# Patient Record
Sex: Male | Born: 1940
Health system: Southern US, Community
[De-identification: ages and names within clinical notes are randomized; demographics above are authoritative.]

## PROBLEM LIST (undated history)

## (undated) DIAGNOSIS — I4510 Unspecified right bundle-branch block: Secondary | ICD-10-CM

## (undated) DIAGNOSIS — M1990 Unspecified osteoarthritis, unspecified site: Secondary | ICD-10-CM

## (undated) DIAGNOSIS — I4892 Unspecified atrial flutter: Secondary | ICD-10-CM

## (undated) DIAGNOSIS — I351 Nonrheumatic aortic (valve) insufficiency: Secondary | ICD-10-CM

## (undated) DIAGNOSIS — T7840XA Allergy, unspecified, initial encounter: Secondary | ICD-10-CM

## (undated) DIAGNOSIS — N200 Calculus of kidney: Secondary | ICD-10-CM

## (undated) DIAGNOSIS — I10 Essential (primary) hypertension: Secondary | ICD-10-CM

## (undated) DIAGNOSIS — I340 Nonrheumatic mitral (valve) insufficiency: Secondary | ICD-10-CM

## (undated) DIAGNOSIS — F419 Anxiety disorder, unspecified: Secondary | ICD-10-CM

## (undated) DIAGNOSIS — I517 Cardiomegaly: Secondary | ICD-10-CM

## (undated) DIAGNOSIS — I493 Ventricular premature depolarization: Secondary | ICD-10-CM

## (undated) DIAGNOSIS — I5189 Other ill-defined heart diseases: Secondary | ICD-10-CM

## (undated) DIAGNOSIS — R001 Bradycardia, unspecified: Secondary | ICD-10-CM

## (undated) DIAGNOSIS — C4491 Basal cell carcinoma of skin, unspecified: Secondary | ICD-10-CM

## (undated) DIAGNOSIS — E785 Hyperlipidemia, unspecified: Secondary | ICD-10-CM

## (undated) DIAGNOSIS — K649 Unspecified hemorrhoids: Secondary | ICD-10-CM

## (undated) DIAGNOSIS — IMO0002 Reserved for concepts with insufficient information to code with codable children: Secondary | ICD-10-CM

## (undated) DIAGNOSIS — R9430 Abnormal result of cardiovascular function study, unspecified: Secondary | ICD-10-CM

## (undated) HISTORY — DX: Abnormal result of cardiovascular function study, unspecified: R94.30

## (undated) HISTORY — DX: Hyperlipidemia, unspecified: E78.5

## (undated) HISTORY — DX: Allergy, unspecified, initial encounter: T78.40XA

## (undated) HISTORY — DX: Unspecified right bundle-branch block: I45.10

## (undated) HISTORY — DX: Ventricular premature depolarization: I49.3

## (undated) HISTORY — DX: Unspecified atrial flutter: I48.92

## (undated) HISTORY — DX: Anxiety disorder, unspecified: F41.9

## (undated) HISTORY — DX: Other ill-defined heart diseases: I51.89

## (undated) HISTORY — DX: Basal cell carcinoma of skin, unspecified: C44.91

## (undated) HISTORY — DX: Unspecified osteoarthritis, unspecified site: M19.90

## (undated) HISTORY — DX: Cardiomegaly: I51.7

## (undated) HISTORY — DX: Calculus of kidney: N20.0

## (undated) HISTORY — PX: COLONOSCOPY: SHX174

## (undated) HISTORY — PX: KIDNEY STONE SURGERY: SHX686

## (undated) HISTORY — DX: Nonrheumatic mitral (valve) insufficiency: I34.0

## (undated) HISTORY — DX: Unspecified hemorrhoids: K64.9

## (undated) HISTORY — DX: Nonrheumatic aortic (valve) insufficiency: I35.1

## (undated) HISTORY — DX: Essential (primary) hypertension: I10

## (undated) HISTORY — DX: Reserved for concepts with insufficient information to code with codable children: IMO0002

## (undated) HISTORY — PX: POLYPECTOMY: SHX149

## (undated) HISTORY — DX: Bradycardia, unspecified: R00.1

---

## 2001-03-28 ENCOUNTER — Encounter: Payer: Self-pay | Admitting: Pulmonary Disease

## 2004-08-14 ENCOUNTER — Ambulatory Visit: Payer: Self-pay | Admitting: Pulmonary Disease

## 2005-08-24 ENCOUNTER — Ambulatory Visit: Payer: Self-pay | Admitting: Pulmonary Disease

## 2006-02-28 ENCOUNTER — Ambulatory Visit: Payer: Self-pay | Admitting: Pulmonary Disease

## 2006-03-05 ENCOUNTER — Ambulatory Visit: Payer: Self-pay | Admitting: Pulmonary Disease

## 2006-09-12 ENCOUNTER — Ambulatory Visit: Payer: Self-pay | Admitting: Pulmonary Disease

## 2006-09-12 LAB — CONVERTED CEMR LAB
Albumin: 4.2 g/dL (ref 3.5–5.2)
Basophils Relative: 1.1 % — ABNORMAL HIGH (ref 0.0–1.0)
CO2: 31 meq/L (ref 19–32)
Calcium: 9.6 mg/dL (ref 8.4–10.5)
Cholesterol: 106 mg/dL (ref 0–200)
Eosinophil percent: 2.3 % (ref 0.0–5.0)
GFR calc non Af Amer: 79 mL/min
Glucose, Bld: 105 mg/dL — ABNORMAL HIGH (ref 70–99)
HCT: 48.2 % (ref 39.0–52.0)
Leukocytes, UA: NEGATIVE
MCHC: 33.6 g/dL (ref 30.0–36.0)
Monocytes Relative: 9.2 % (ref 3.0–11.0)
Neutrophils Relative %: 51.1 % (ref 43.0–77.0)
Nitrite: NEGATIVE
Platelets: 191 10*3/uL (ref 150–400)
Potassium: 3.5 meq/L (ref 3.5–5.1)
RDW: 12.4 % (ref 11.5–14.6)
Specific Gravity, Urine: 1.015 (ref 1.000–1.03)
Total Protein, Urine: NEGATIVE mg/dL
Total Protein: 7 g/dL (ref 6.0–8.3)
Urine Glucose: NEGATIVE mg/dL
Urobilinogen, UA: 0.2 (ref 0.0–1.0)
VLDL: 12 mg/dL (ref 0–40)
WBC: 5.7 10*3/uL (ref 4.5–10.5)

## 2007-03-28 ENCOUNTER — Ambulatory Visit: Payer: Self-pay | Admitting: Pulmonary Disease

## 2007-10-20 DIAGNOSIS — Z87442 Personal history of urinary calculi: Secondary | ICD-10-CM

## 2007-10-21 ENCOUNTER — Ambulatory Visit: Payer: Self-pay | Admitting: Pulmonary Disease

## 2007-10-21 DIAGNOSIS — K649 Unspecified hemorrhoids: Secondary | ICD-10-CM | POA: Insufficient documentation

## 2007-10-21 DIAGNOSIS — F411 Generalized anxiety disorder: Secondary | ICD-10-CM

## 2007-10-21 DIAGNOSIS — M1990 Unspecified osteoarthritis, unspecified site: Secondary | ICD-10-CM

## 2007-10-21 HISTORY — DX: Unspecified hemorrhoids: K64.9

## 2007-10-29 ENCOUNTER — Encounter: Payer: Self-pay | Admitting: Pulmonary Disease

## 2007-10-29 ENCOUNTER — Ambulatory Visit: Payer: Self-pay

## 2007-11-04 ENCOUNTER — Telehealth (INDEPENDENT_AMBULATORY_CARE_PROVIDER_SITE_OTHER): Payer: Self-pay | Admitting: *Deleted

## 2007-11-24 ENCOUNTER — Telehealth: Payer: Self-pay | Admitting: Pulmonary Disease

## 2008-04-19 ENCOUNTER — Ambulatory Visit: Payer: Self-pay | Admitting: Pulmonary Disease

## 2008-08-13 ENCOUNTER — Telehealth (INDEPENDENT_AMBULATORY_CARE_PROVIDER_SITE_OTHER): Payer: Self-pay | Admitting: *Deleted

## 2008-10-20 ENCOUNTER — Ambulatory Visit: Payer: Self-pay | Admitting: Pulmonary Disease

## 2008-10-26 DIAGNOSIS — Z85828 Personal history of other malignant neoplasm of skin: Secondary | ICD-10-CM | POA: Insufficient documentation

## 2008-10-27 ENCOUNTER — Ambulatory Visit: Payer: Self-pay | Admitting: Cardiology

## 2008-11-10 ENCOUNTER — Telehealth (INDEPENDENT_AMBULATORY_CARE_PROVIDER_SITE_OTHER): Payer: Self-pay | Admitting: *Deleted

## 2008-11-11 ENCOUNTER — Ambulatory Visit: Payer: Self-pay

## 2008-11-11 ENCOUNTER — Encounter: Payer: Self-pay | Admitting: Cardiology

## 2008-12-01 ENCOUNTER — Ambulatory Visit: Payer: Self-pay | Admitting: Cardiology

## 2008-12-01 ENCOUNTER — Encounter: Payer: Self-pay | Admitting: Cardiology

## 2008-12-28 ENCOUNTER — Ambulatory Visit: Payer: Self-pay

## 2008-12-28 ENCOUNTER — Encounter: Payer: Self-pay | Admitting: Cardiology

## 2009-01-10 ENCOUNTER — Telehealth: Payer: Self-pay | Admitting: Cardiology

## 2009-02-04 ENCOUNTER — Ambulatory Visit: Payer: Self-pay | Admitting: Cardiology

## 2009-05-04 ENCOUNTER — Telehealth (INDEPENDENT_AMBULATORY_CARE_PROVIDER_SITE_OTHER): Payer: Self-pay | Admitting: *Deleted

## 2009-07-06 ENCOUNTER — Encounter (INDEPENDENT_AMBULATORY_CARE_PROVIDER_SITE_OTHER): Payer: Self-pay | Admitting: *Deleted

## 2009-07-18 ENCOUNTER — Telehealth (INDEPENDENT_AMBULATORY_CARE_PROVIDER_SITE_OTHER): Payer: Self-pay | Admitting: *Deleted

## 2009-07-21 ENCOUNTER — Encounter (INDEPENDENT_AMBULATORY_CARE_PROVIDER_SITE_OTHER): Payer: Self-pay | Admitting: *Deleted

## 2009-07-22 ENCOUNTER — Ambulatory Visit: Payer: Self-pay | Admitting: Gastroenterology

## 2009-08-03 DIAGNOSIS — D126 Benign neoplasm of colon, unspecified: Secondary | ICD-10-CM

## 2009-08-03 HISTORY — DX: Benign neoplasm of colon, unspecified: D12.6

## 2009-08-12 ENCOUNTER — Ambulatory Visit: Payer: Self-pay | Admitting: Gastroenterology

## 2009-08-17 ENCOUNTER — Ambulatory Visit: Payer: Self-pay | Admitting: Pulmonary Disease

## 2009-08-23 ENCOUNTER — Encounter: Payer: Self-pay | Admitting: Gastroenterology

## 2009-12-12 ENCOUNTER — Encounter (INDEPENDENT_AMBULATORY_CARE_PROVIDER_SITE_OTHER): Payer: Self-pay | Admitting: *Deleted

## 2009-12-28 ENCOUNTER — Ambulatory Visit: Payer: Self-pay | Admitting: Pulmonary Disease

## 2009-12-28 DIAGNOSIS — J309 Allergic rhinitis, unspecified: Secondary | ICD-10-CM

## 2009-12-28 LAB — CONVERTED CEMR LAB
Alkaline Phosphatase: 66 units/L (ref 39–117)
BUN: 15 mg/dL (ref 6–23)
Bilirubin, Direct: 0.3 mg/dL (ref 0.0–0.3)
CO2: 34 meq/L — ABNORMAL HIGH (ref 19–32)
Chloride: 102 meq/L (ref 96–112)
Cholesterol: 105 mg/dL (ref 0–200)
Creatinine, Ser: 1 mg/dL (ref 0.4–1.5)
GFR calc non Af Amer: 78.62 mL/min (ref >60)
Glucose, Bld: 104 mg/dL — ABNORMAL HIGH (ref 70–99)
HCT: 47.1 % (ref 39.0–52.0)
LDL Cholesterol: 43 mg/dL (ref 0–99)
Lymphocytes Relative: 28.6 % (ref 12.0–46.0)
Lymphs Abs: 1.7 10*3/uL (ref 0.7–4.0)
MCHC: 34 g/dL (ref 30.0–36.0)
MCV: 89.5 fL (ref 78.0–100.0)
Neutro Abs: 3.8 10*3/uL (ref 1.4–7.7)
Neutrophils Relative %: 63.3 % (ref 43.0–77.0)
Potassium: 4.2 meq/L (ref 3.5–5.1)
RDW: 13.9 % (ref 11.5–14.6)
Sodium: 144 meq/L (ref 135–145)
TSH: 0.72 microintl units/mL (ref 0.35–5.50)
Total Bilirubin: 1.8 mg/dL — ABNORMAL HIGH (ref 0.3–1.2)
Total CHOL/HDL Ratio: 2
Total Protein: 7.4 g/dL (ref 6.0–8.3)
VLDL: 5.4 mg/dL (ref 0.0–40.0)

## 2010-02-02 ENCOUNTER — Encounter: Payer: Self-pay | Admitting: Cardiology

## 2010-02-03 ENCOUNTER — Ambulatory Visit: Payer: Self-pay | Admitting: Cardiology

## 2010-02-03 DIAGNOSIS — I4892 Unspecified atrial flutter: Secondary | ICD-10-CM

## 2010-02-03 DIAGNOSIS — I480 Paroxysmal atrial fibrillation: Secondary | ICD-10-CM

## 2010-02-03 HISTORY — DX: Unspecified atrial flutter: I48.92

## 2010-02-06 ENCOUNTER — Telehealth: Payer: Self-pay | Admitting: Pulmonary Disease

## 2010-02-08 ENCOUNTER — Encounter: Payer: Self-pay | Admitting: Cardiology

## 2010-02-08 ENCOUNTER — Ambulatory Visit (HOSPITAL_COMMUNITY): Admission: RE | Admit: 2010-02-08 | Discharge: 2010-02-08 | Payer: Self-pay | Admitting: Cardiology

## 2010-02-08 ENCOUNTER — Ambulatory Visit: Payer: Self-pay

## 2010-02-08 ENCOUNTER — Ambulatory Visit: Payer: Self-pay | Admitting: Cardiovascular Disease

## 2010-02-15 ENCOUNTER — Encounter: Payer: Self-pay | Admitting: Cardiology

## 2010-02-16 ENCOUNTER — Ambulatory Visit: Payer: Self-pay | Admitting: Cardiology

## 2010-03-20 ENCOUNTER — Ambulatory Visit: Payer: Self-pay | Admitting: Internal Medicine

## 2010-03-23 ENCOUNTER — Ambulatory Visit: Payer: Self-pay | Admitting: Cardiology

## 2010-06-21 ENCOUNTER — Encounter: Payer: Self-pay | Admitting: Cardiology

## 2010-06-22 ENCOUNTER — Ambulatory Visit: Payer: Self-pay | Admitting: Cardiology

## 2010-06-28 ENCOUNTER — Ambulatory Visit: Payer: Self-pay | Admitting: Pulmonary Disease

## 2010-10-01 LAB — CONVERTED CEMR LAB
ALT: 24 units/L (ref 0–53)
ALT: 26 units/L (ref 0–53)
Albumin: 4.3 g/dL (ref 3.5–5.2)
Alkaline Phosphatase: 59 units/L (ref 39–117)
Alkaline Phosphatase: 61 units/L (ref 39–117)
Basophils Absolute: 0 10*3/uL (ref 0.0–0.1)
Basophils Absolute: 0.1 10*3/uL (ref 0.0–0.1)
Basophils Relative: 0.6 % (ref 0.0–3.0)
Basophils Relative: 1 % (ref 0.0–1.0)
Bilirubin Urine: NEGATIVE
CO2: 34 meq/L — ABNORMAL HIGH (ref 19–32)
Calcium: 9.7 mg/dL (ref 8.4–10.5)
Calcium: 9.8 mg/dL (ref 8.4–10.5)
Chloride: 101 meq/L (ref 96–112)
Chloride: 102 meq/L (ref 96–112)
Creatinine, Ser: 0.9 mg/dL (ref 0.4–1.5)
Creatinine, Ser: 1 mg/dL (ref 0.4–1.5)
Eosinophils Absolute: 0.1 10*3/uL (ref 0.0–0.7)
Eosinophils Relative: 1.5 % (ref 0.0–5.0)
GFR calc non Af Amer: 79 mL/min
Glucose, Bld: 100 mg/dL — ABNORMAL HIGH (ref 70–99)
Glucose, Bld: 102 mg/dL — ABNORMAL HIGH (ref 70–99)
HCT: 50.3 % (ref 39.0–52.0)
LDL Cholesterol: 53 mg/dL (ref 0–99)
LDL Cholesterol: 61 mg/dL (ref 0–99)
Leukocytes, UA: NEGATIVE
MCHC: 35 g/dL (ref 30.0–36.0)
MCV: 89.7 fL (ref 78.0–100.0)
Monocytes Absolute: 0.4 10*3/uL (ref 0.2–0.7)
Monocytes Relative: 8.4 % (ref 3.0–12.0)
Nitrite: NEGATIVE
PSA: 1.27 ng/mL (ref 0.10–4.00)
Platelets: 140 10*3/uL — ABNORMAL LOW (ref 150–400)
Platelets: 169 10*3/uL (ref 150–400)
Potassium: 4.3 meq/L (ref 3.5–5.1)
RBC: 5.37 M/uL (ref 4.22–5.81)
RBC: 5.6 M/uL (ref 4.22–5.81)
RDW: 12.5 % (ref 11.5–14.6)
Sodium: 142 meq/L (ref 135–145)
TSH: 1.48 microintl units/mL (ref 0.35–5.50)
Total CHOL/HDL Ratio: 2.1
Total Protein, Urine: NEGATIVE mg/dL
Total Protein: 7 g/dL (ref 6.0–8.3)
Triglycerides: 47 mg/dL (ref 0–149)
Urine Glucose: NEGATIVE mg/dL
WBC: 5.5 10*3/uL (ref 4.5–10.5)
WBC: 5.7 10*3/uL (ref 4.5–10.5)
pH: 7 (ref 5.0–8.0)

## 2010-10-04 NOTE — Assessment & Plan Note (Signed)
Summary: ROV 6 MONTHS///KP   Primary Care Carlos [name]:  Carlos [name]  CC:  6 month ROV & review mult medical problems....  History of Present Illness: 70 y/o WM here for a follow up visit... he has multiple medical problems as noted below...     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  BP controlled on meds;  denies CP, palpit, SOB, edema;  Chol looks good on Simva + Fish Oil;  no new complaints or concerns...    ~  June 28, 2010:  he has developed AFlutter in the interval & had evals from [name] 7/11 & [name] 10/11- 2DEcho 6/11 showed mod conc LVH, norm wall motion & EF= 50-55%, mild AI, mild thick MV leaflets w/ mild MR, mod LAdil= 42mm;  Holter 6/11 revealed AFlutter (ave rate 106, max=145, no pauses)... they rec rate control & ASA, w/ consideration of cath ablation if not controlled...  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    Current Problems:   HYPERTENSION (ICD-401.9) - controlled on ATENOLOL 50mg /d,  FELODIPINE 10mg /d, & LOSARTAN/HCT 100-12.5 daily...  BP= 110/66 today & similar at home (ave= 130-140/ 80)- tolerating rx well... pt denies HA, fatigue, visual changes, CP, palipit, dizziness, syncope, dyspnea, edema, etc...  ~  see 2DEchos below>  ~  CXR 4/11 showed borderline cardiomeg, clear lungs, DJD spine, NAD.Marland KitchenMarland Kitchen  VALVULAR HEART DISEASE (ICD-424.90) - followed by Delton See- he knows to take AMOX prior to dental work...   ~  2DEcho in 2002 w/ mild AI, MR, TR and normal LVF...  ~  2DEcho 2/09 showed LV sl dilated w/ mild DD, norm LVF w/o regional wall motion abn & EF= 55-60%;  mild AI, mild-mod MR, dil LA @ 52mm...  ~  NuclearStressTest 3/10 showed mild inferobas thinning, no ischemia, EF=48% w/ mild global HK...  ~  2DEcho 4/10 showed mild AI & MR, mod dil LA,  norm LVF w/ EF= 55-60% (Myoview= false result).  ~  2DEcho 6/11 showed mod conc LVH, norm wall motion & EF= 50-55%, mild AI, mild thick MV leaflets w/ mild MR, mod LAdil= 42mm.  ATRIAL FLUTTER (ICD-427.32) & BUNDLE BRANCH BLOCK, RIGHT (ICD-426.4) - on ASA 325mg /d and rate control strategy... prev EKG's w/ IRBBB...2/09 w/ IVCD, NSR, & QS wave in III and aVF (new)... check LVF (nl) and wall motion (nl) on echo...  ~  he developed AFlutter 2011& had evals from [name] 7/11 & [name] 10/11- 2DEcho 6/11 showed mod conc LVH, norm wall motion & EF= 50-55%, mild AI, mild thick MV leaflets w/ mild MR, mod LAdil= 42mm;  Holter 6/11 revealed AFlutter (ave rate 106, max=145, no pauses)... they rec rate control & ASA, w/ consideration of cath ablation if not controlled...  HYPERCHOLESTEROLEMIA (ICD-272.0) - on SIMVASTATIN 40mg /d, Fish Oil, & diet...   ~  FLP 1/08 showed TChol 106, TG 59, HDL 48, LDL46  ~  FLP 2/09 showed TChol 123, TG 57, HDL 51, LDL 61  ~  FLP 2/10 showed TChol 117, TG 47, HDL 55, LDL 53  ~  FLP 4/11 showed TChol 105, TG 27, HDL 57, LDL 43  Hx of HEMORRHOIDS (ICD-455.6) - last colonoscopy in [place] 3/05 by DrNichols was neg x hems... rec f/u 5 yrs due  to fam history & he asks to be seen here (refer to GI)...  ~  12/10: had f/u colonoscopy by DrStark- 3mm sessile polyp in cecum= path pending...  +mild divertics...   RENAL CALCULUS, HX OF (ICD-V13.01) - surg in 1974 for kid stone... no recurrence...  DEGENERATIVE JOINT DISEASE (ICD-715.90) - occas neck discomfort without arm pain, numbness, etc... he is very active...  ANXIETY (ICD-300.00)  BASAL CELL CARCINOMA, HX OF (ICD-V10.83) - he follow up w/ Derm in [place] every 3-80months.  Health Maintenance:  ~  GI:  DrStark w/ colon 12/10 as above...  ~  GU:  4/11> DRE= neg & PSA= 1.50  ~  Immunizations:  he gets yearly Flu shots,  ?Pneumovax,  ?Tetanus...   Preventive Screening-Counseling & Management  Alcohol-Tobacco     Smoking  Status: never  Allergies (verified): No Known Drug Allergies  Comments:  Nurse/Medical Assistant: The patient's medications and allergies were reviewed with the patient and were updated in the Medication and Allergy Lists.  Past History:  Past Medical History: ALLERGIC RHINITIS (ICD-477.9) HYPERTENSION (ICD-401.9)  AORTIC INSUFFICIENCY (ICD-424.1)....mild...echo.Marland KitchenMarland Kitchen6/2011 MITRAL REGURGITATION (ICD-396.3)...mild...echo.Marland KitchenMarland Kitchen6/2011 LVH   moderate...echo.Marland KitchenMarland Kitchen6/2011 DIASTOLIC DYSFUNCTION (ICD-429.9) EF  50-55%...echo.Marland KitchenMarland Kitchen6/2011 (55-60% echo 2009) BUNDLE BRANCH BLOCK, RIGHT (ICD-426.4)=   IRBBB; sinus Bradycadia w/ PVCs Nuclear stress... march, 2010... no ischemia... EF 48% by echo at that time normal LV function ATRIAL FLUTTER (ICD-427.32) Atrial fibrillation.(or ATRIAL FLUTTER).... diagnosed.. February 03, 2010 /  consult by Dr. Ladona Ridgel.. June, 2011... plan rate control.. Coumadin not needed...ablation if rate not controlled well  HYPERCHOLESTEROLEMIA (ICD-272.0) HEMORRHOIDS (ICD-455.6);; RENAL CALCULUS, HX OF (ICD-V13.01) DEGENERATIVE JOINT DISEASE (ICD-715.90) ANXIETY (ICD-300.00) BASAL CELL CARCINOMA, HX OF (ICD-V10.83)  Past Surgical History: S/P surgery for kidney stone 1974  Family History: Reviewed history from 04/19/2008 and no changes required. mother alive age 73  hx of HBP father deceased age 62 1 sibling alive age 16 1 sibling alive age 1  Social History: Reviewed history from 04/19/2008 and no changes required. never smoked exercises daily no caffeine etoh---3 glasses of wine per week married 2 children  Review of Systems      See HPI  The patient denies anorexia, fever, weight loss, weight gain, vision loss, decreased hearing, hoarseness, chest pain, syncope, dyspnea on exertion, peripheral edema, prolonged cough, headaches, hemoptysis, abdominal pain, melena, hematochezia, severe indigestion/heartburn, hematuria, incontinence, muscle weakness, suspicious skin  lesions, transient blindness, difficulty walking, depression, unusual weight change, abnormal bleeding, enlarged lymph nodes, and angioedema.    Vital Signs:  Patient profile:   70 year old male Height:      72 inches Weight:      186.2 pounds BMI:     25.34 O2 Sat:      97 % on Room air Temp:     97.1 degrees F oral Pulse rate:   49 / minute BP sitting:   110 / 66  (left arm) Cuff size:   regular  Vitals Entered By: Randell Loop CMA (June 28, 2010 9:43 AM)  O2 Flow:  Room air CC: 6 month ROV & review mult medical problems...   Physical Exam  Additional Exam:  WD, WN, 70 y/o WM in NAD... GENERAL:  Alert & oriented; pleasant & cooperative... HEENT:  [place]/AT, EOM-wnl, PERRLA, Fundi-benign, EACs-clear, TMs-wnl, NOSE-clear, THROAT-clear & wnl. NECK:  Supple w/ fairROM; no JVD; normal carotid impulses w/o bruits; no thyromegaly or nodules palpated; no lymphadenopathy. CHEST:  Clear to P & A; without wheezes/ rales/ or rhonchi. HEART:  Regular Rhythm;  gr1/6 SEM LSB  without rubs or gallops heard... ABDOMEN:  Soft & nontender; normal bowel sounds; no organomegaly or masses detected. (RECTAL:  Neg - prostate 2+ & nontender w/o nodules; stool hematest neg) EXT: without deformities or arthritic changes; no varicose veins/ venous insuffic/ or edema. NEURO:  CN's intact; motor testing normal; sensory testing normal; gait normal & balance OK. DERM:  No lesions noted; no rash etc...    Impression & Recommendations:  Problem # 1:  HYPERTENSION (ICD-401.9) Controlled>  same meds. His updated medication list for this problem includes:    Atenolol 50 Mg Tabs (Atenolol) .Marland Kitchen... Take 1 tablet by mouth once daily    Felodipine 10 Mg Tb24 (Felodipine) .Marland Kitchen... Take 1 tablet by mouth once a day    Hyzaar 100-12.5 Mg Tabs (Losartan potassium-hctz) .Marland Kitchen... Take 1 tablet by mouth once a day  Problem # 2:  ATRIAL FLUTTER (ICD-427.32) Stable rate control strategy>  per [name]. His updated medication  list for this problem includes:    Bayer Low Strength 81 Mg Tbec (Aspirin) .Marland Kitchen... Take 1 tablet by mouth once a day    Atenolol 50 Mg Tabs (Atenolol) .Marland Kitchen... Take 1 tablet by mouth once daily    Felodipine 10 Mg Tb24 (Felodipine) .Marland Kitchen... Take 1 tablet by mouth once a day  Problem # 3:  MITRAL REGURGITATION (ICD-396.3) Forllowed for Cards by [name]>  stable, continue current Rx. His updated medication list for this problem includes:    Bayer Low Strength 81 Mg Tbec (Aspirin) .Marland Kitchen... Take 1 tablet by mouth once a day    Atenolol 50 Mg Tabs (Atenolol) .Marland Kitchen... Take 1 tablet by mouth once daily  Problem # 4:  HYPERCHOLESTEROLEMIA (ICD-272.0) Well controlled on diet + Simva40... continue same. His updated medication list for this problem includes:    Simvastatin 40 Mg Tabs (Simvastatin) .Marland Kitchen... Take 1 tab by mouth at bedtime...  Problem # 5:  Hx of HEMORRHOIDS (ICD-455.6) GI & GU are stable & up to date...  Problem # 6:  OTHER MEDICAL PROBLEMS AS NOTED>>> OK 2011 Flu vaccine today...  Complete Medication List: 1)  Bayer Low Strength 81 Mg Tbec (Aspirin) .... Take 1 tablet by mouth once a day 2)  Atenolol 50 Mg Tabs (Atenolol) .... Take 1 tablet by mouth once daily 3)  Felodipine 10 Mg Tb24 (Felodipine) .... Take 1 tablet by mouth once a day 4)  Hyzaar 100-12.5 Mg Tabs (Losartan potassium-hctz) .... Take 1 tablet by mouth once a day 5)  Simvastatin 40 Mg Tabs (Simvastatin) .... Take 1 tab by mouth at bedtime.Marland KitchenMarland Kitchen 6)  Fish Oil 1000 Mg Caps (Omega-3 fatty acids) .... Take one capsule daily.Marland KitchenMarland Kitchen 7)  Multivitamins Tabs (Multiple vitamin) .... Take 1 tablet by mouth once a day 8)  Amoxicillin 500 Mg Caps (Amoxicillin) .... 4 tabs 1 hr prior to procedure  Other Orders: Flu Vaccine 27yrs + MEDICARE PATIENTS (F0932) Administration Flu vaccine - MCR (T5573)  Patient Instructions: 1)  Today we updated your med list- see below.... 2)  Continue your current meds the same... 3)  Today we gave you the 2011 Flu  vaccine... 4)  Call for any problems.Marland KitchenMarland Kitchen 5)  Please schedule a follow-up appointment in 6 months.                 Flu Vaccine Consent Questions     Do you have a history of severe allergic reactions to this vaccine? no    Any prior history of allergic reactions to egg and/or gelatin? no  Do you have a sensitivity to the preservative Thimersol? no    Do you have a past history of Guillan-Barre Syndrome? no    Do you currently have an acute febrile illness? no    Have you ever had a severe reaction to latex? no    Vaccine information given and explained to patient? yes    Are you currently pregnant? no    Lot Number:[id number]   Exp Date:03/03/2011   Site Given  Left Deltoid IMflu1 Zackery Barefoot CMA  June 28, 2010 11:19 AM

## 2010-10-04 NOTE — Assessment & Plan Note (Signed)
Summary: 1 month rov.sl   Visit Type:  Follow-up Referring Provider:  Sharrell Ku, MD Primary Provider:  Chauncey Mann  CC:  atrial flutter.  History of Present Illness: The patient is seen for followup of atrial flutter.  I had seen him last on February 16, 2010. At that time I noted that his monitor showed atrial flutter.  I consider a one-time cardioversion.  The patient and his wife and I ultimately decided to refer him to Dr. Ladona Ridgel for an opinion.  Dr. Ladona Ridgel felt that most likely he would not hold sinus rhythm.  His embolic risk is low.  He felt that rate control would be an excellent approach.  Patient has been on atenolol in the past and historically it is effective in slowing his heart rate.  His atenolol dose was therefore increased and the patient is here for followup.  Dr. Ladona Ridgel noted that the patient's heart rate was in the range of 100 on the monitor.  Patient feels well today.  Current Medications (verified): 1)  Bayer Aspirin 81 Mg  Tabs (Aspirin) .... Take 1 Tablet By Mouth Once A Day 2)  Atenolol 50 Mg  Tabs (Atenolol) .... Take 1 Tablet By Mouth Once Daily 3)  Felodipine 10 Mg  Tb24 (Felodipine) .... Take 1 Tablet By Mouth Once A Day 4)  Hyzaar 100-12.5 Mg Tabs (Losartan Potassium-Hctz) .... Take 1 Tablet By Mouth Once A Day 5)  Simvastatin 40 Mg  Tabs (Simvastatin) .... Take 1 Tab By Mouth At Bedtime.Marland KitchenMarland Kitchen 6)  Fish Oil 1000 Mg  Caps (Omega-3 Fatty Acids) .... Take One Capsule Daily.Marland KitchenMarland Kitchen 7)  Multivitamins   Tabs (Multiple Vitamin) .... Take 1 Tablet By Mouth Once A Day 8)  Amoxicillin 500 Mg Caps (Amoxicillin) .... 4 Tabs 1 Hr Prior To Procedure  Allergies (verified): No Known Drug Allergies  Past History:  Past Medical History: ALLERGIC RHINITIS (ICD-477.9) HYPERTENSION (ICD-401.9) AORTIC INSUFFICIENCY (ICD-424.1)....mild...echo.Marland KitchenMarland Kitchen6/2011 MITRAL REGURGITATION (ICD-396.3)...mild...echo.Marland KitchenMarland Kitchen6/2011 LVH   moderate...echo.Marland KitchenMarland Kitchen6/2011 EF  50-55%...echo.Marland KitchenMarland Kitchen6/2011 (55-60% echo  2009) DIASTOLIC DYSFUNCTION (ICD-429.9) BUNDLE BRANCH BLOCK, RIGHT (ICD-426.4)=   IRBBB; sinus Bradycadia w/ PVCs Nuclear stress... march, 2010... no ischemia... EF 48% by echo at that time normal LV function HYPERCHOLESTEROLEMIA (ICD-272.0) HEMORRHOIDS (ICD-455.6) RENAL CALCULUS, HX OF (ICD-V13.01) DEGENERATIVE JOINT DISEASE (ICD-715.90) ANXIETY (ICD-300.00) BASAL CELL CARCINOMA, HX OF (ICD-V10.83) Atrial fibrillation.(or ATRIAL FLUTTER).... diagnosed.. February 03, 2010 /  consult by Dr. Ladona Ridgel.. June, 2011... plan rate control.. Coumadin not needed  Review of Systems       Patient denies fever, chills, headache, sweats, rash, change in vision, change in hearing, chest pain, cough, nausea vomiting, urinary symptoms.  All other systems are reviewed and are negative.  Vital Signs:  Patient profile:   70 year old male Height:      72 inches Weight:      186 pounds BMI:     25.32 Pulse rate:   85 / minute BP sitting:   108 / 80  (left arm) Cuff size:   regular  Vitals Entered By: Hardin Negus, RMA (March 23, 2010 3:18 PM)  Physical Exam  General:  patient is stable. Eyes:  no xanthelasma. Neck:  no jugular venous distention. Lungs:  lungs are clear.  Respiratory effort is nonlabored. Heart:  cardiac exam reveals S1-S2.  No clicks or significant murmurs. Abdomen:  abdomen is soft. Extremities:  no peripheral edema. Psych:  patient is oriented to person time and place.  Affect is normal.  He is here with his wife today.  Impression & Recommendations:  Problem # 1:  HYPERTENSION (ICD-401.9) Blood pressure is under reasonable control at this time.  No change in therapy.  Problem # 2:  ATRIAL FLUTTER (ICD-427.32) I have reviewed completely the consult note from Dr. Ladona Ridgel.  The initial strategy will be rate controlled with aspirin.  If this does not work  ablation will be considered.the patient's heart rate will be watched with him on a higher dose of atenolol.  He will call these  in to [place].  Patient Instructions: 1)  Give [place] a call with some readings of his Heart Rate, [phone] Heather S.  2)  Follow up in 3 months.

## 2010-10-04 NOTE — Assessment & Plan Note (Signed)
Summary: ep eval  dx v12.59/per dr Myrtis Ser   Visit Type:  Follow-up Primary Provider:  Chauncey Mann   History of Present Illness: Carlos [name] is referred today for evaluation of atrial flutter by Dr.Katz.  The patient is a very pleasant 70 yo man with a h/o atrial flutter diagnosed last month.  He has not had any symptoms that he will admit to.  He carries a h/o mitral regurge and aortic insufficiency in the setting of LVH.  He remains very active, despite being retired keeping a full time job in [employer].  His wife who is with him corroborates his history.  He had a 24 hour holter which demonstrated atrial flutter with an average ventricular rate of 106 and a maximum rate of 145.  He had no pauses.  He has a h/o sinus bradycardia requiring him to decrease his dose of atenolol in the past (when he was in NSR).  He has never had syncope or dizziness.  He denies c/p and sob.  Current Medications (verified): 1)  Bayer Aspirin 81 Mg  Tabs (Aspirin) .... Take 1 Tablet By Mouth Once A Day 2)  Atenolol 50 Mg  Tabs (Atenolol) .... Take 1/2 Tablet By Mouth Once Daily 3)  Felodipine 10 Mg  Tb24 (Felodipine) .... Take 1 Tablet By Mouth Once A Day 4)  Hyzaar 100-12.5 Mg Tabs (Losartan Potassium-Hctz) .... Take 1 Tablet By Mouth Once A Day 5)  Simvastatin 40 Mg  Tabs (Simvastatin) .... Take 1 Tab By Mouth At Bedtime.Marland KitchenMarland Kitchen 6)  Fish Oil 1000 Mg  Caps (Omega-3 Fatty Acids) .... Take One Capsule Daily.Marland KitchenMarland Kitchen 7)  Multivitamins   Tabs (Multiple Vitamin) .... Take 1 Tablet By Mouth Once A Day 8)  Amoxicillin 500 Mg Caps (Amoxicillin) .... 4 Tabs 1 Hr Prior To Procedure  Allergies (verified): No Known Drug Allergies  Past History:  Past Medical History: Last updated: 02/16/2010 ALLERGIC RHINITIS (ICD-477.9) HYPERTENSION (ICD-401.9) AORTIC INSUFFICIENCY (ICD-424.1)....mild...echo.Marland KitchenMarland Kitchen6/2011 MITRAL REGURGITATION (ICD-396.3)...mild...echo.Marland KitchenMarland Kitchen6/2011 LVH   moderate...echo.Marland KitchenMarland Kitchen6/2011 EF  50-55%...echo.Marland KitchenMarland Kitchen6/2011  (55-60% echo 2009) DIASTOLIC DYSFUNCTION (ICD-429.9) BUNDLE BRANCH BLOCK, RIGHT (ICD-426.4)=   IRBBB; sinus Bradycadia w/ PVCs Nuclear stress... march, 2010... no ischemia... EF 48% by echo at that time normal LV function HYPERCHOLESTEROLEMIA (ICD-272.0) HEMORRHOIDS (ICD-455.6) RENAL CALCULUS, HX OF (ICD-V13.01) DEGENERATIVE JOINT DISEASE (ICD-715.90) ANXIETY (ICD-300.00) BASAL CELL CARCINOMA, HX OF (ICD-V10.83) Atrial fibrillation.(or ATRIAL FLUTTER).... diagnosed.. February 03, 2010  Past Surgical History: Last updated: 12/28/2009 S/P surgery for kidney stone 1974  Family History: Last updated: 04/19/2008 mother alive age 51  hx of HBP father deceased age 71 1 sibling alive age 51 1 sibling alive age 65  Social History: Last updated: 04/19/2008 never smoked exercises daily no caffeine etoh---3 glasses of wine per week married 2 children  Review of Systems       All systems reviewed and negative.  Vital Signs:  Patient profile:   70 year old male Height:      72 inches Weight:      187 pounds BMI:     25.45 O2 Sat:      94 % Pulse rate:   74 / minute BP sitting:   152 / 80  (left arm)  Vitals Entered By: Laurance Flatten CMA (March 20, 2010 4:27 PM)  Physical Exam  General:  Well developed, well nourished, in no acute distress.  HEENT: normal Neck: supple. No JVD. Carotids 2+ bilaterally no bruits Cor: RRR with no rubs or gallops. A 2/6 systolic murmur is present at the  LLSB. Lungs: CTA. No increased work of breathing. Ab: soft, nontender. nondistended. No HSM. Good bowel sounds Ext: warm. no cyanosis, clubbing or edema Neuro: alert and oriented. Grossly nonfocal. affect pleasant    Impression & Recommendations:  Problem # 1:  ATRIAL FLUTTER (ICD-427.32) His symptoms are well controlled.  His rate is not well controlled.  I discussed the treatment options with the patient at length.  His CHADS score is one. (HTN) and after a long discussion, I think an initial  strategy of rate control and ASA is most reasonable.  If his rate is not well controlled, then I would recommend proceeding with catheter ablation.  I think the likelihood of his developing atrial fibrillation after ablation with his valvular abnormalities is remote. His updated medication list for this problem includes:    Atenolol 50 Mg Tabs (Atenolol) .Marland Kitchen... Take 1 tablet by mouth once daily  Problem # 2:  HYPERTENSION (ICD-401.9) His blood pressure is elevated in the office today. Unclear if this represents "white coat" HTN or not.  I have asked him to increase his atenolol to 50 mg daily. A low sodium diet is also recommended. His updated medication list for this problem includes:    Atenolol 50 Mg Tabs (Atenolol) .Marland Kitchen... Take 1 tablet by mouth once daily    Felodipine 10 Mg Tb24 (Felodipine) .Marland Kitchen... Take 1 tablet by mouth once a day    Hyzaar 100-12.5 Mg Tabs (Losartan potassium-hctz) .Marland Kitchen... Take 1 tablet by mouth once a day  Problem # 3:  DIASTOLIC DYSFUNCTION (ICD-429.9) He has no evidence of CHF.  I have asked the patient and his wife to watch out for any subtle signs of CHF.    Patient Instructions: 1)  Your physician recommends that you schedule a follow-up appointment in: as needed 2)  Your physician has recommended you make the following change in your medication: increase Atenolol to 50mg  daily

## 2010-10-04 NOTE — Miscellaneous (Signed)
  Clinical Lists Changes  Observations: Added new observation of PAST MED HX: ALLERGIC RHINITIS (ICD-477.9) HYPERTENSION (ICD-401.9) AORTIC INSUFFICIENCY (ICD-424.1)....mild...echo.Marland KitchenMarland Kitchen6/2011 MITRAL REGURGITATION (ICD-396.3)...mild...echo.Marland KitchenMarland Kitchen6/2011 LVH   moderate...echo.Marland KitchenMarland Kitchen6/2011 EF  50-55%...echo.Marland KitchenMarland Kitchen6/2011 (55-60% echo 2009) DIASTOLIC DYSFUNCTION (ICD-429.9) BUNDLE BRANCH BLOCK, RIGHT (ICD-426.4)=   IRBBB; sinus [place]cadia w/ PVCs Nuclear stress... march, 2010... no ischemia... EF 48% by echo at that time normal LV function HYPERCHOLESTEROLEMIA (ICD-272.0) HEMORRHOIDS (ICD-455.6) RENAL CALCULUS, HX OF (ICD-V13.01) DEGENERATIVE JOINT DISEASE (ICD-715.90) ANXIETY (ICD-300.00) BASAL CELL CARCINOMA, HX OF (ICD-V10.83) Atrial fibrillation.(or ATRIAL FLUTTER).... diagnosed.. February 03, 2010 /  consult by Dr. Ladona Ridgel.. June, 2011... plan rate control.. Coumadin not needed...ablation if rate not controlled well    (06/21/2010 12:39) Added new observation of REFERRING MD: Sharrell Ku, MD (06/21/2010 12:39) Added new observation of PRIMARY MD: Chauncey Mann (06/21/2010 12:39)       Past History:  Past Medical History: ALLERGIC RHINITIS (ICD-477.9) HYPERTENSION (ICD-401.9) AORTIC INSUFFICIENCY (ICD-424.1)....mild...echo.Marland KitchenMarland Kitchen6/2011 MITRAL REGURGITATION (ICD-396.3)...mild...echo.Marland KitchenMarland Kitchen6/2011 LVH   moderate...echo.Marland KitchenMarland Kitchen6/2011 EF  50-55%...echo.Marland KitchenMarland Kitchen6/2011 (55-60% echo 2009) DIASTOLIC DYSFUNCTION (ICD-429.9) BUNDLE BRANCH BLOCK, RIGHT (ICD-426.4)=   IRBBB; sinus [place] w/ PVCs Nuclear stress... march, 2010... no ischemia... EF 48% by echo at that time normal LV function HYPERCHOLESTEROLEMIA (ICD-272.0) HEMORRHOIDS (ICD-455.6) RENAL CALCULUS, HX OF (ICD-V13.01) DEGENERATIVE JOINT DISEASE (ICD-715.90) ANXIETY (ICD-300.00) BASAL CELL CARCINOMA, HX OF (ICD-V10.83) Atrial fibrillation.(or ATRIAL FLUTTER).... diagnosed.. February 03, 2010 /  consult by Dr. Ladona Ridgel.. June, 2011... plan rate control.. Coumadin not  needed...ablation if rate not controlled well

## 2010-10-04 NOTE — Miscellaneous (Signed)
  Clinical Lists Changes  Observations: Added new observation of CXR RESULTS: The lungs are clear but hyperaerated .  Mediastinal contours are stable.  The heart is mildly enlarged and stable. There are degenerative changes throughout the thoracic spine.   IMPRESSION: Stable cardiomegaly.  No change in hyperaeration . (12/28/2009 9:56)      CXR  Procedure date:  12/28/2009  Findings:      The lungs are clear but hyperaerated .  Mediastinal contours are stable.  The heart is mildly enlarged and stable. There are degenerative changes throughout the thoracic spine.   IMPRESSION: Stable cardiomegaly.  No change in hyperaeration .

## 2010-10-04 NOTE — Assessment & Plan Note (Signed)
Summary: f1y   Visit Type:  Follow-up Primary Provider:  Chauncey Mann  CC:  aortic insufficiency and mitral regurgitation.  History of Present Illness: The patient is seen for followup of aortic insufficiency and mitral regurgitation.  I saw him last in June, 2011.  He is very active and he is done very well.  He was seen last by Dr.Nadel on December 28, 2009.  There is an excellent complete note from Dr.Nadel.  At that time his physical exam is described as showing his heartbeat to be regular.  Patient has not had any symptoms.  However EKG today reveals atrial fibrillation.  The resting rate is mildly increased.  The patient has no symptoms from this.  There is no prior history of atrial fibrillation.  Current Medications (verified): 1)  Bayer Aspirin 81 Mg  Tabs (Aspirin) .... Take 1 Tablet By Mouth Once A Day 2)  Atenolol 50 Mg  Tabs (Atenolol) .... Take 1/2 Tablet By Mouth Once Daily 3)  Felodipine 10 Mg  Tb24 (Felodipine) .... Take 1 Tablet By Mouth Once A Day 4)  Hyzaar 100-12.5 Mg Tabs (Losartan Potassium-Hctz) .... Take 1 Tablet By Mouth Once A Day 5)  Simvastatin 40 Mg  Tabs (Simvastatin) .... Take 1 Tab By Mouth At Bedtime.Marland KitchenMarland Kitchen 6)  Fish Oil 1000 Mg  Caps (Omega-3 Fatty Acids) .... Take One Capsule Daily.Marland KitchenMarland Kitchen 7)  Multivitamins   Tabs (Multiple Vitamin) .... Take 1 Tablet By Mouth Once A Day 8)  Amoxicillin 500 Mg Caps (Amoxicillin) .... 4 Tabs 1 Hr Prior To Procedure  Allergies (verified): No Known Drug Allergies  Past History:  Past Medical History: ALLERGIC RHINITIS (ICD-477.9) HYPERTENSION (ICD-401.9) VALVULAR HEART DISEASE (ICD-424.90) - MR= mild/mod, AI= mild, mild Diastolic Dysfunction AORTIC INSUFFICIENCY (ICD-424.1) MITRAL REGURGITATION (ICD-396.3) DIASTOLIC DYSFUNCTION (ICD-429.9) BUNDLE BRANCH BLOCK, RIGHT (ICD-426.4)=   IRBBB; sinus [place]cadia w/ PVCs, EF 55-60% on 2D.Marland KitchenFebruary, 2009 Nuclear stress... march, 2010... no ischemia... EF 48% by echo at that time normal LV  function HYPERCHOLESTEROLEMIA (ICD-272.0) Hx of HEMORRHOIDS (ICD-455.6) RENAL CALCULUS, HX OF (ICD-V13.01) DEGENERATIVE JOINT DISEASE (ICD-715.90) ANXIETY (ICD-300.00) BASAL CELL CARCINOMA, HX OF (ICD-V10.83) Atrial fibrillation..... diagnosed.. February 03, 2010  Review of Systems       Patient denies fever, chills, headache, sweats, rash, change in vision, change in hearing, chest pain, cough, nausea vomiting, urinary symptoms.  All other systems are reviewed and are negative.  Vital Signs:  Patient profile:   70 year old male Height:      72 inches Weight:      187 pounds BMI:     25.45 Pulse rate:   74 / minute BP sitting:   136 / 62  (left arm) Cuff size:   regular  Vitals Entered By: Burnett Kanaris, CNA (February 03, 2010 9:39 AM)  Physical Exam  General:  The patient is quite stable. Head:  head is atraumatic. Eyes:  no xanthelasma. Neck:  no jugular venous distention.  No carotid bruits. Chest Wall:  no chest wall tenderness. Lungs:  lungs are clear.  Respiratory effort is nonlabored. Heart:  the rhythm is irregularly irregular.  There are no clicks or significant murmurs. Abdomen:  abdomen is soft. Msk:  no musculoskeletal deformities. Extremities:  no peripheral edema. Skin:  no skin rashes. Psych:  patient is oriented to person time and place.  Affect is normal.   Impression & Recommendations:  Problem # 1:  DYSLIPIDEMIA (ICD-272.4)  His updated medication list for this problem includes:    Simvastatin 40  Mg Tabs (Simvastatin) .Marland Kitchen... Take 1 tab by mouth at bedtime... The patient's lipids are treated.  Problem # 2:  HYPERTENSION (ICD-401.9)  His updated medication list for this problem includes:    Atenolol 50 Mg Tabs (Atenolol) .Marland Kitchen... Take 1/2 tablet by mouth once daily    Felodipine 10 Mg Tb24 (Felodipine) .Marland Kitchen... Take 1 tablet by mouth once a day    Hyzaar 100-12.5 Mg Tabs (Losartan potassium-hctz) .Marland Kitchen... Take 1 tablet by mouth once a day Blood pressure is under  good control.  No change in therapy.  Problem # 3:  AORTIC INSUFFICIENCY (ICD-424.1)  His updated medication list for this problem includes:    Atenolol 50 Mg Tabs (Atenolol) .Marland Kitchen... Take 1/2 tablet by mouth once daily    Hyzaar 100-12.5 Mg Tabs (Losartan potassium-hctz) .Marland Kitchen... Take 1 tablet by mouth once a day Aortic insufficiency by history it is mild.  Followup two-dimensional echo is to be done.  Problem # 4:  MITRAL REGURGITATION (ICD-396.3) Mitral regurgitation has been mild by history.  Followup two-dimensional echo is to be done.  Problem # 5:  ATRIAL FIBRILLATION, HX OF (ICD-V12.59)  His updated medication list for this problem includes:    Atenolol 50 Mg Tabs (Atenolol) .Marland Kitchen... Take 1/2 tablet by mouth once daily    Felodipine 10 Mg Tb24 (Felodipine) .Marland Kitchen... Take 1 tablet by mouth once a day The diagnosis of atrial fibrillation is new today.  The patient does not sense the rhythm.  I cannot be sure how long he has been in atrial fib or whether or not he goes in and out.  I explained this to him completely.  EKG is done today and reviewed by me.  He has incomplete right bundle branch block that is unchanged.  There is atrial fibrillation.  Two-dimensional echo will be done to reassess LV function and valvular function.  He will wear a 24-hour Holter to see if he is in atrial fib all the time. Recent TSH was normal.  His CHADS2  score is one.  I've chosen not to start Coumadin at this time.  I've also chosen not to adjust his medicines yet.  When I see him back with Holter data an echo data I will decide whether we need meds for rate control.  Also decide if we will proceed with anticoagulation and the possibility of cardioversion on one occasion.  The patient does not have symptoms.  Orders: Holter Monitor (Holter Monitor) Echocardiogram (Echo)  Patient Instructions: 1)  Your physician recommends that you schedule a follow-up appointment in: 2 weeks 2)  Your physician has requested that  you have an echocardiogram.  Echocardiography is a painless test that uses sound waves to create images of your heart. It provides your doctor with information about the size and shape of your heart and how well your heart's chambers and valves are working.  This procedure takes approximately one hour. There are no restrictions for this procedure. 3)  Your physician has recommended that you wear a holter monitor.  Holter monitors are medical devices that record the heart's electrical activity. Doctors most often use these monitors to diagnose arrhythmias. Arrhythmias are problems with the speed or rhythm of the heartbeat. The monitor is a small, portable device. You can wear one while you do your normal daily activities. This is usually used to diagnose what is causing palpitations/syncope (passing out).

## 2010-10-04 NOTE — Assessment & Plan Note (Signed)
Summary: 2WKS F/U ECHO/HOLTER MONITOR ON 02-08-10/SL   Visit Type:  Follow-up Primary Provider:  Chauncey Mann  CC:  atrial flutter.  History of Present Illness: The patient is seen in followup his atrial arrhythmia.  He has mild valvular heart disease.  There is history of right bundle branch block.  There is no history of proven coronary disease.  Nuclear stress study in March, 2010 revealed no significant ischemia.  When I saw him for routine visit on February 03, 2010, the rhythm was irregular.  I felt this was atrial fib at that time.  With review of the 12-lead today, he may well have been atrial flutter.  The patient had no symptoms.  I decided to do a followup 2-D echo and to have him wear a 24 hour Holter monitor.  By my review today I believe that the Holter reveals atrial flutter throughout the day.  There are varied rates.  This patient said he was active during the day and had no symptoms.  Two-dimensional echo was done on February 08, 2010.  Ejection fraction was read as 50-55%. (with the patient in atrial flutter).   Echo in 2009 had been read as 55-60%.his current echo reveals that the aortic insufficiency remains mild mitral regurgitation is mild.  There is moderate left ventricular hypertrophy. Review of the record shows that there is an EKG from February, 2010 and shows definite normal sinus rhythm.  The patient's CHADS2 score is one.  I have therefore not place him on Coumadin at this time.  Current Medications (verified): 1)  Bayer Aspirin 81 Mg  Tabs (Aspirin) .... Take 1 Tablet By Mouth Once A Day 2)  Atenolol 50 Mg  Tabs (Atenolol) .... Take 1/2 Tablet By Mouth Once Daily 3)  Felodipine 10 Mg  Tb24 (Felodipine) .... Take 1 Tablet By Mouth Once A Day 4)  Hyzaar 100-12.5 Mg Tabs (Losartan Potassium-Hctz) .... Take 1 Tablet By Mouth Once A Day 5)  Simvastatin 40 Mg  Tabs (Simvastatin) .... Take 1 Tab By Mouth At Bedtime.Marland KitchenMarland Kitchen 6)  Fish Oil 1000 Mg  Caps (Omega-3 Fatty Acids) .... Take One Capsule  Daily.Marland KitchenMarland Kitchen 7)  Multivitamins   Tabs (Multiple Vitamin) .... Take 1 Tablet By Mouth Once A Day 8)  Amoxicillin 500 Mg Caps (Amoxicillin) .... 4 Tabs 1 Hr Prior To Procedure  Allergies (verified): No Known Drug Allergies  Past History:  Past Medical History: ALLERGIC RHINITIS (ICD-477.9) HYPERTENSION (ICD-401.9) AORTIC INSUFFICIENCY (ICD-424.1)....mild...echo.Marland KitchenMarland Kitchen6/2011 MITRAL REGURGITATION (ICD-396.3)...mild...echo.Marland KitchenMarland Kitchen6/2011 LVH   moderate...echo.Marland KitchenMarland Kitchen6/2011 EF  50-55%...echo.Marland KitchenMarland Kitchen6/2011 (55-60% echo 2009) DIASTOLIC DYSFUNCTION (ICD-429.9) BUNDLE BRANCH BLOCK, RIGHT (ICD-426.4)=   IRBBB; sinus Bradycadia w/ PVCs Nuclear stress... march, 2010... no ischemia... EF 48% by echo at that time normal LV function HYPERCHOLESTEROLEMIA (ICD-272.0) HEMORRHOIDS (ICD-455.6) RENAL CALCULUS, HX OF (ICD-V13.01) DEGENERATIVE JOINT DISEASE (ICD-715.90) ANXIETY (ICD-300.00) BASAL CELL CARCINOMA, HX OF (ICD-V10.83) Atrial fibrillation.(or ATRIAL FLUTTER).... diagnosed.. February 03, 2010  Review of Systems       Patient denies fever, chills, headache, sweats, rash, change in vision, change in hearing, chest pain, cough, nausea vomiting, urinary symptoms.  All of the systems are reviewed and are negative  Vital Signs:  Patient profile:   70 year old male Height:      72 inches Weight:      187 pounds BMI:     25.45 Pulse rate:   86 / minute BP sitting:   117 / 74  (left arm)  Vitals Entered By: Burnett Kanaris, CNA (February 16, 2010 11:50 AM)  Physical  Exam  General:  patient is stable. Head:  head is atraumatic. Eyes:  no xanthelasma. Neck:  no jugular venous attention. Chest Wall:  no chest wall tenderness Lungs:  lungs are clear.  Respiratory effort is nonlabored. Heart:  cardiac exam reveals S1-S2.  No clicks or significant murmurs. Abdomen:  abdomen is soft. Msk:  no musculoskeletal deformities Extremities:  no peripheral edema Skin:  no skin rashes Psych:  patient is oriented to person time and  place.  Affect is normal. She is here with his wife today.   Impression & Recommendations:  Problem # 1:  DYSLIPIDEMIA (ICD-272.4) The patient is on medications for this problem.  Problem # 2:  HYPERTENSION (ICD-401.9) Blood pressure is under good control today.  Problem # 3:  AORTIC INSUFFICIENCY (ICD-424.1) Recent echo shows that the aortic insufficiency is mild.  Problem # 4:  MITRAL REGURGITATION (ICD-396.3) Recent echo shows mitral regurgitation is mild.  Problem # 5:  ATRIAL FIBRILLATION, HX OF (ICD-V12.59)  His updated medication list for this problem includes:    Atenolol 50 Mg Tabs (Atenolol) .Marland Kitchen... Take 1/2 tablet by mouth once daily    Felodipine 10 Mg Tb24 (Felodipine) .Marland Kitchen... Take 1 tablet by mouth once a day  Orders: EP Referral (Cardiology EP Ref ) When I  initially reviewed the EKG from February 03, 2010 I thought that this was atrial fibrillation.  With further review I think this may have been atrial flutter.  The Holter monitor shows atrial flutter.  Of course it will be important for [place] to know if he has both were just atrial flutter.  We note that one year ago he had sinus rhythm.  He has no symptoms.  His most recent ejection fraction is slightly lower than the previous one.  This was obtained while he had his current rhythm.  I cannot rule out the possibility of slight decrease in LV function from the rhythm.  I feel it is appropriate to try to convert him to sinus rhythm.  I have not started Coumadin or Pradaxa at this time..  We discussed the possibility of anticoagulation for one month and then cardioversion.  I also offered the possibility of EP evaluation to help with the planning.  I will be very interested in the EP evaluation to see if it is thought that he has atrial flutter both on June 3 and on the Holter monitor.  I will look forward to seeing him back concerning whether we should proceed with anticoagulation and cardioversion or whether other approaches should be  taken.   Patient Instructions: 1)  You have been referred for EP eval. 2)  Follow up with Dr Myrtis Ser in 4 weeks

## 2010-10-04 NOTE — Assessment & Plan Note (Signed)
Summary: CPX/LWA   Primary Care Provider:  Chauncey Mann  CC:  4 month ROV & review of mult medical issues....  History of Present Illness: 70 y/o WM here for a follow up visit... he has multiple medical problems as noted below...     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  BP controlled on meds;  denies CP, palpit, SOB, edema;  Chol looks good on Simva + Fish Oil;  no new complaints or concerns...     Current Problems:     HYPERTENSION (ICD-401.9) - controlled on ATENOLOL 50mg - 1/2tab daily,  FELODIPINE 10mg /d, & DIOVAN/Hct 160-12.5 daily...  BP= 138/60 today & similar at home (ave= 130-140/ 80)- tolerating rx well... pt denies HA, fatigue, visual changes, CP, palipit, dizziness, syncope, dyspnea, edema, etc...  ~  CXR 4/11 showed borderline cardiomeg, clear lungs, DJD spine, NAD.Marland KitchenMarland Kitchen  VALVULAR HEART DISEASE (ICD-424.90) - followed by Delton See- he knows to take AMOX prior to dental work...   ~  2DEcho in 2002 w/ mild AI, MR, TR and normal LVF...  ~  2DEcho 2/09 showed LV sl dilated w/ mild DD, norm LVF w/o regional wall motion abn & EF= 55-60%;  mild AI, mild-mod MR, dil LA @ 52mm...  ~  NuclearStressTest 3/10 showed mild inferobas thinning, no ischemia, EF=48% w/ mild global HK...  ~  2DEcho 4/10 showed mild AI & MR, mod dil LA, norm LVF w/ EF= 55-60% (Myoview= false result).  ~  6/10: he had f/u visit [name]- stable, no changes made...  BUNDLE BRANCH BLOCK, RIGHT (ICD-426.4) - on ASA 325mg /d... prev EKG's w/ IRBBB...2/09 w/ IVCD, NSR, & QS wave in III and aVF (new)... check LVF (nl) and wall motion (nl) on echo...  HYPERCHOLESTEROLEMIA (ICD-272.0) - on SIMVASTATIN 40mg /d, Fish Oil, & diet...   ~  FLP 1/08 showed TChol 106, TG 59, HDL 48, LDL46  ~  FLP 2/09 showed TChol 123, TG 57, HDL 51, LDL 61  ~  FLP 2/10 showed TChol 117, TG 47, HDL 55, LDL 53  ~  FLP 4/11 showed TChol 105, TG 27, HDL 57, LDL  43  Hx of HEMORRHOIDS (ICD-455.6) - last colonoscopy in [place] 3/05 by DrNichols was neg x hems... rec f/u 5 yrs due to fam history & he asks to be seen here (refer to GI)...  ~  12/10: had f/u colonoscopy by DrStark- 3mm sessile polyp in cecum= path pending...  +mild divertics...   RENAL CALCULUS, HX OF (ICD-V13.01) - surg in 1974 for kid stone... no recurrence...  DEGENERATIVE JOINT DISEASE (ICD-715.90) - occas neck discomfort without arm pain, numbness, etc... he is very active...  ANXIETY (ICD-300.00)  BASAL CELL CARCINOMA, HX OF (ICD-V10.83) - he follow up w/ Derm in [place] every 3-55months.  Health Maintenance:  ~  GI:  DrStark w/ colon 12/10 as above...  ~  GU:  4/11> DRE= neg & PSA= 1.50  ~  Immunizations:  he gets yearly Flu shots,  ?Pneumovax,  ?Tetanus...   Allergies (verified): No Known Drug Allergies  Comments:  Nurse/Medical Assistant: The patient's medications and allergies were reviewed with the patient and were updated in the Medication and Allergy Lists.  Past History:  Past Medical History: ALLERGIC RHINITIS (ICD-477.9) HYPERTENSION (ICD-401.9) VALVULAR HEART DISEASE (ICD-424.90) - MR= mild/mod, AI= mild, mild Diastolic Dysfunction AORTIC INSUFFICIENCY (ICD-424.1) MITRAL REGURGITATION (ICD-396.3) DIASTOLIC DYSFUNCTION (ICD-429.9) BUNDLE BRANCH BLOCK, RIGHT (ICD-426.4)=   IRBBB; sinus  Bradycadia w/ PVCs, EF 55-60% on 2D HYPERCHOLESTEROLEMIA (ICD-272.0) Hx of HEMORRHOIDS (ICD-455.6) RENAL CALCULUS, HX OF (ICD-V13.01) DEGENERATIVE JOINT DISEASE (ICD-715.90) ANXIETY (ICD-300.00) BASAL CELL CARCINOMA, HX OF (ICD-V10.83)  Past Surgical History: S/P surgery for kidney stone 1974  Family History: Reviewed history from 04/19/2008 and no changes required. mother alive age 24  hx of HBP father deceased age 51 1 sibling alive age 36 1 sibling alive age 80  Social History: Reviewed history from 04/19/2008 and no changes required. never smoked exercises  daily no caffeine etoh---3 glasses of wine per week married 2 children  Review of Systems       The patient complains of nasal congestion.  The patient denies fever, chills, sweats, anorexia, fatigue, weakness, malaise, weight loss, sleep disorder, blurring, diplopia, eye irritation, eye discharge, vision loss, eye pain, photophobia, earache, ear discharge, tinnitus, decreased hearing, nosebleeds, sore throat, hoarseness, chest pain, palpitations, syncope, dyspnea on exertion, orthopnea, PND, peripheral edema, cough, dyspnea at rest, excessive sputum, hemoptysis, wheezing, pleurisy, nausea, vomiting, diarrhea, constipation, change in bowel habits, abdominal pain, melena, hematochezia, jaundice, gas/bloating, indigestion/heartburn, dysphagia, odynophagia, dysuria, hematuria, urinary frequency, urinary hesitancy, nocturia, incontinence, back pain, joint pain, joint swelling, muscle cramps, muscle weakness, stiffness, arthritis, sciatica, restless legs, leg pain at night, leg pain with exertion, rash, itching, dryness, suspicious lesions, paralysis, paresthesias, seizures, tremors, vertigo, transient blindness, frequent falls, frequent headaches, difficulty walking, depression, anxiety, memory loss, confusion, cold intolerance, heat intolerance, polydipsia, polyphagia, polyuria, unusual weight change, abnormal bruising, bleeding, enlarged lymph nodes, urticaria, allergic rash, hay fever, and recurrent infections.    Vital Signs:  Patient profile:   70 year old male Height:      72 inches Weight:      188.25 pounds BMI:     25.62 O2 Sat:      95 % on Room air Temp:     97.1 degrees F oral Pulse rate:   61 / minute BP sitting:   122 / 70  (left arm) Cuff size:   regular  Vitals Entered By: Randell Loop CMA (December 28, 2009 10:12 AM)  O2 Sat at Rest %:  95 O2 Flow:  Room air CC: 4 month ROV & review of mult medical issues... Is Patient Diabetic? No Pain Assessment Patient in pain? no       Comments no changes in meds today   Physical Exam  Additional Exam:  WD, WN, 70 y/o WM in NAD... GENERAL:  Alert & oriented; pleasant & cooperative... HEENT:  [place]/AT, EOM-wnl, PERRLA, Fundi-benign, EACs-clear, TMs-wnl, NOSE-clear, THROAT-clear & wnl. NECK:  Supple w/ fairROM; no JVD; normal carotid impulses w/o bruits; no thyromegaly or nodules palpated; no lymphadenopathy. CHEST:  Clear to P & A; without wheezes/ rales/ or rhonchi. HEART:  Regular Rhythm;  gr1/6 SEM LSB without rubs or gallops heard... ABDOMEN:  Soft & nontender; normal bowel sounds; no organomegaly or masses detected. RECTAL:  Neg - prostate 2+ & nontender w/o nodules; stool hematest neg EXT: without deformities or arthritic changes; no varicose veins/ venous insuffic/ or edema. NEURO:  CN's intact; motor testing normal; sensory testing normal; gait normal & balance OK. DERM:  No lesions noted; no rash etc...    CXR  Procedure date:  12/28/2009  Findings:      CHEST - 2 VIEW Comparison: Chest x-ray of 10/20/2008   Findings: The lungs are clear but hyperaerated .  Mediastinal contours are stable.  The heart is mildly enlarged and stable. There are degenerative changes throughout the thoracic spine.  IMPRESSION: Stable cardiomegaly.  No change in hyperaeration .   Read By:  Juline Patch,  M.D.    MISC. Report  Procedure date:  12/28/2009  Findings:      BMP (METABOL)   Sodium                    144 mEq/L                   135-145   Potassium                 4.2 mEq/L                   3.5-5.1   Chloride                  102 mEq/L                   96-112   Carbon Dioxide       [H]  34 mEq/L                    19-32   Glucose              [H]  104 mg/dL                   59-56   BUN                       15 mg/dL                    3-87   Creatinine                1.0 mg/dL                   5.6-4.3   Calcium                   9.7 mg/dL                   3.2-95.1   GFR                        78.62 mL/min                >60  Hepatic/Liver Function Panel (HEPATIC)   Total Bilirubin      [H]  1.8 mg/dL                   8.8-4.1   Direct Bilirubin          0.3 mg/dL                   6.6-0.6   Alkaline Phosphatase      66 U/L                      39-117   AST                       29 U/L                      0-37   ALT                       25 U/L  0-53   Total Protein             7.4 g/dL                    1.6-1.0   Albumin                   4.6 g/dL                    9.6-0.4  CBC Platelet w/Diff (CBCD)   White Cell Count          6.0 K/uL                    4.5-10.5   Red Cell Count            5.27 Mil/uL                 4.22-5.81   Hemoglobin                16.0 g/dL                   54.0-98.1   Hematocrit                47.1 %                      39.0-52.0   MCV                       89.5 fl                     78.0-100.0   Platelet Count            154.0 K/uL                  150.0-400.0   Neutrophil %              63.3 %                      43.0-77.0   Lymphocyte %              28.6 %                      12.0-46.0   Monocyte %                6.9 %                       3.0-12.0   Eosinophils%              0.7 %                       0.0-5.0   Basophils %               0.5 %                       0.0-3.0  Comments:      TSH (TSH)   FastTSH                   0.72 uIU/mL                 0.35-5.50  Lipid Panel (LIPID)   Cholesterol  105 mg/dL                   1-610   Triglycerides             27.0 mg/dL                  9.6-045.4   HDL                       09.81 mg/dL                 >19.14   LDL Cholesterol           43 mg/dL                    7-82  Prostate Specific Antigen (PSA)   PSA-Hyb                   1.50 ng/mL                  0.10-4.00   Impression & Recommendations:  Problem # 1:  ALLERGIC RHINITIS (ICD-477.9) We discussed OTC Rx including Antihist in AM, Saline freq during the day...  Problem # 2:   HYPERTENSION (ICD-401.9) Controlled-  same meds. His updated medication list for this problem includes:    Atenolol 50 Mg Tabs (Atenolol) .Marland Kitchen... Take 1/2 tablet by mouth once daily    Felodipine 10 Mg Tb24 (Felodipine) .Marland Kitchen... Take 1 tablet by mouth once a day    Hyzaar 100-12.5 Mg Tabs (Losartan potassium-hctz) .Marland Kitchen... Take 1 tablet by mouth once a day  Orders: Prescription Created Electronically [phone]) T-2 View CXR (71020TC) TLB-BMP (Basic Metabolic Panel-BMET) (80048-METABOL) TLB-Hepatic/Liver Function Pnl (80076-HEPATIC) TLB-CBC Platelet - w/Differential (85025-CBCD) TLB-TSH (Thyroid Stimulating Hormone) (84443-TSH) TLB-Lipid Panel (80061-LIPID) TLB-PSA (Prostate Specific Antigen) (84153-PSA)  Problem # 3:  AORTIC INSUFFICIENCY (ICD-424.1) He has Valvular heart dis w/ AI, MR and some DD... followed by Delton See yearly & doing well... 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 His updated medication list for this problem includes:    Atenolol 50 Mg Tabs (Atenolol) .Marland Kitchen... Take 1/2 tablet by mouth once daily  Problem # 4:  HYPERCHOLESTEROLEMIA (ICD-272.0) His FLP looks fantastic-  continue same meds. His updated medication list for this problem includes:    Simvastatin 40 Mg Tabs (Simvastatin) .Marland Kitchen... Take 1 tab by mouth at bedtime...  Orders: TLB-BMP (Basic Metabolic Panel-BMET) (80048-METABOL) TLB-Hepatic/Liver Function Pnl (80076-HEPATIC) TLB-CBC Platelet - w/Differential (85025-CBCD) TLB-TSH (Thyroid Stimulating Hormone) (84443-TSH) TLB-Lipid Panel (80061-LIPID) TLB-PSA (Prostate Specific Antigen) (84153-PSA)  Problem # 5:  DEGENERATIVE JOINT DISEASE (ICD-715.90) He manages quite well w/o specific difficulties...  Problem # 6:  OTHER MEDICAL PROBLEMS AS NOTED>>>  Complete Medication List: 1)  Bayer Aspirin 81 Mg Tabs (aspirin)  .... Take 1 tablet by mouth once a day 2)  Atenolol 50 Mg Tabs (Atenolol) .... Take 1/2 tablet by mouth once daily 3)  Felodipine 10 Mg Tb24  (Felodipine) .... Take 1 tablet by mouth once a day 4)  Hyzaar 100-12.5 Mg Tabs (Losartan potassium-hctz) .... Take 1 tablet by mouth once a day 5)  Simvastatin 40 Mg Tabs (Simvastatin) .... Take 1 tab by mouth at bedtime.Marland KitchenMarland Kitchen 6)  Fish Oil 1000 Mg Caps (Omega-3 fatty acids) .... Take one capsule daily.Marland KitchenMarland Kitchen 7)  Multivitamins Tabs (Multiple vitamin) .... Take 1 tablet by mouth once a day 8)  Amoxicillin 500 Mg Caps (Amoxicillin) .... 4 tabs 1 hr prior to procedure  Patient  Instructions: 1)  Today we updated your med list- see below.... 2)  We refilled your meds for 2011... 3)  Today we did your follow up CXR & Fasting blood work... please call the "phone tree" in a few days for your lab results.Marland KitchenMarland Kitchen 4)  Call for any problems... Prescriptions: AMOXICILLIN 500 MG CAPS (AMOXICILLIN) 4 tabs 1 hr prior to procedure  #20 x prn   Entered and Authorized by:   Michele Mcalpine MD   Signed by:   Michele Mcalpine MD on 12/28/2009   Method used:   Print then Give to Patient   RxID:   [id number] SIMVASTATIN 40 MG  TABS (SIMVASTATIN) take 1 tab by mouth at bedtime...  #30 x prn   Entered and Authorized by:   Michele Mcalpine MD   Signed by:   Michele Mcalpine MD on 12/28/2009   Method used:   Print then Give to Patient   RxID:   [id number] HYZAAR 100-12.5 MG TABS (LOSARTAN POTASSIUM-HCTZ) Take 1 tablet by mouth once a day  #30 x prn   Entered and Authorized by:   Michele Mcalpine MD   Signed by:   Michele Mcalpine MD on 12/28/2009   Method used:   Print then Give to Patient   RxID:   [id number] FELODIPINE 10 MG  TB24 (FELODIPINE) Take 1 tablet by mouth once a day  #30 x prn   Entered and Authorized by:   Michele Mcalpine MD   Signed by:   Michele Mcalpine MD on 12/28/2009   Method used:   Print then Give to Patient   RxID:   [id number] ATENOLOL 50 MG  TABS (ATENOLOL) take 1/2 tablet by mouth once daily  #30 x prn   Entered and Authorized by:   Michele Mcalpine MD   Signed by:   Michele Mcalpine MD on  12/28/2009   Method used:   Print then Give to Patient   RxID:   [id number]

## 2010-10-04 NOTE — Progress Notes (Signed)
Summary: concerns about testing  Phone Note Call from Patient Call back at [phone]   Caller: Patient Call For: nadel Reason for Call: Talk to Nurse, Talk to Doctor Summary of Call: suppose to have test on Wed. in Cardiology.  Would like to speak to SN BEFORE the test.  pt has concerns about these test.  Myrtis Ser told him he was having atrialfib.  Wants to discuss w/ you(SN). Initial call taken by: Eugene Gavia,  February 06, 2010 9:34 AM  Follow-up for Phone Call        Called, spoke with pt.  Pt states on April 27th, he had a CPX with SN and "everything appeared to be fine."  However, last week he was seen by Dr. Myrtis Ser for check up and was told he possibly has Afib.  Pt scheduled for Echo on Wednesday but first would like to know if SN can look at the EKG done at Dr. Myrtis Ser office and give his recs on this.  Pt also states diovan was changed to cozaar in Feb and would like to know if this could have caused the afib.  Will forward message to SN-pls advise.  Thanks! Follow-up by: Gweneth Dimitri RN,  February 06, 2010 9:58 AM  Additional Follow-up for Phone Call Additional follow up Details #1::        I called pt per request> looks like he went into AFib betw the 4/11 CX & the 6/11 OV w/ cards... sched for holter & f/u 2dEcho... Discussed w/ pt... SN Additional Follow-up by: Michele Mcalpine MD,  February 06, 2010 1:32 PM

## 2010-10-04 NOTE — Assessment & Plan Note (Signed)
Summary: 3 month rov.sl   Visit Type:  Follow-up Referring Provider:  Sharrell Ku, MD Primary Provider:  Chauncey Mann  CC:  atrial flutter.  History of Present Illness: The patient is seen for followup of atrial flutter.  I saw him last in July, 2011.  We decided to follow his atrial flutter rate.  He is very tearful he reported this and his rate remains in the range of 75.  He feels good with this and he is fully active.  He is very pleased with his status.  Current Medications (verified): 1)  Bayer Aspirin 81 Mg  Tabs (Aspirin) .... Take 1 Tablet By Mouth Once A Day 2)  Atenolol 50 Mg  Tabs (Atenolol) .... Take 1 Tablet By Mouth Once Daily 3)  Felodipine 10 Mg  Tb24 (Felodipine) .... Take 1 Tablet By Mouth Once A Day 4)  Hyzaar 100-12.5 Mg Tabs (Losartan Potassium-Hctz) .... Take 1 Tablet By Mouth Once A Day 5)  Simvastatin 40 Mg  Tabs (Simvastatin) .... Take 1 Tab By Mouth At Bedtime.Marland KitchenMarland Kitchen 6)  Fish Oil 1000 Mg  Caps (Omega-3 Fatty Acids) .... Take One Capsule Daily.Marland KitchenMarland Kitchen 7)  Multivitamins   Tabs (Multiple Vitamin) .... Take 1 Tablet By Mouth Once A Day 8)  Amoxicillin 500 Mg Caps (Amoxicillin) .... 4 Tabs 1 Hr Prior To Procedure  Allergies (verified): No Known Drug Allergies  Past History:  Past Medical History: ALLERGIC RHINITIS (ICD-477.9) HYPERTENSION (ICD-401.9) AORTIC INSUFFICIENCY (ICD-424.1)....mild...echo.Marland KitchenMarland Kitchen6/2011 MITRAL REGURGITATION (ICD-396.3)...mild...echo.Marland KitchenMarland Kitchen6/2011 [facility]   moderate...echo.Marland KitchenMarland Kitchen6/2011 EF  50-55%...echo.Marland KitchenMarland Kitchen6/2011 (55-60% echo 2009) DIASTOLIC DYSFUNCTION (ICD-429.9) BUNDLE BRANCH BLOCK, RIGHT (ICD-426.4)=   IRBBB; sinus [place]cadia w/ PVCs Nuclear stress... march, 2010... no ischemia... EF 48% by echo at that time normal LV function HYPERCHOLESTEROLEMIA (ICD-272.0) HEMORRHOIDS (ICD-455.6);; RENAL CALCULUS, HX OF (ICD-V13.01) DEGENERATIVE JOINT DISEASE (ICD-715.90) ANXIETY (ICD-300.00) BASAL CELL CARCINOMA, HX OF (ICD-V10.83) Atrial fibrillation.(or ATRIAL  FLUTTER).... diagnosed.. February 03, 2010 /  consult by Dr. Ladona Ridgel.. June, 2011... plan rate control.. Coumadin not needed...ablation if rate not controlled well  Review of Systems       Patient denies fever, chills, headache, sweats, rash, change in vision, change in hearing, chest pain, cough, nausea vomiting, urinary symptoms.  All of the systems are reviewed and are negative.  Vital Signs:  Patient profile:   70 year old male Height:      72 inches Weight:      186 pounds BMI:     25.32 Pulse rate:   60 / minute BP sitting:   98 / 62  (left arm) Cuff size:   regular  Vitals Entered By: Hardin Negus, RMA (June 22, 2010 3:51 PM)  Physical Exam  General:  patient is stable. Eyes:  no xanthelasma. Neck:  no jugular venous distention. Lungs:  lungs are clear.  Respiratory effort is nonlabored. Heart:  cardiac exam reveals S1-S2.  No clicks or significant murmurs. Abdomen:  abdomen is soft. Extremities:  no peripheral edema. Psych:  patient is oriented to person time and place.  Affect is normal.   Impression & Recommendations:  Problem # 1:  ATRIAL FLUTTER (ICD-427.32)  His updated medication list for this problem includes:    Atenolol 50 Mg Tabs (Atenolol) .Marland Kitchen... Take 1 tablet by mouth once daily The patient's atrial flutter rate is nicely controlled.  He is doing well.  We have decided that he does not need Coumadin.  He will be watched as long as his atrial flutter rate is controlled.  He would like to see Dr. Ladona Ridgel  in 6 months and then me in one year and this is certainly fine with me.As part of the evaluation today I've reviewed completely the extensive records that he brings with his heart rate blood pressure over several months.  Problem # 2:  DYSLIPIDEMIA (ICD-272.4)  His updated medication list for this problem includes:    Simvastatin 40 Mg Tabs (Simvastatin) .Marland Kitchen... Take 1 tab by mouth at bedtime... His lipids are being treated.  No change in therapy.  Problem #  3:  HYPERTENSION (ICD-401.9)  His updated medication list for this problem includes:    Atenolol 50 Mg Tabs (Atenolol) .Marland Kitchen... Take 1 tablet by mouth once daily    Felodipine 10 Mg Tb24 (Felodipine) .Marland Kitchen... Take 1 tablet by mouth once a day    Hyzaar 100-12.5 Mg Tabs (Losartan potassium-hctz) .Marland Kitchen... Take 1 tablet by mouth once a day Blood pressure is well-controlled.  No change in therapy.  Patient Instructions: 1)  Follow up with Dr Ladona Ridgel in 6 months.  You will receive a reminder letter in the mail two months in advance.   2)  Your physician wants you to follow-up in:  1 year.  You will receive a reminder letter in the mail two months in advance. If you don't receive a letter, please call our office to schedule the follow-up appointment.

## 2010-10-04 NOTE — Miscellaneous (Signed)
  Clinical Lists Changes  Observations: Added new observation of PAST MED HX: ALLERGIC RHINITIS (ICD-477.9) HYPERTENSION (ICD-401.9) AORTIC INSUFFICIENCY (ICD-424.1)....mild...echo.Marland KitchenMarland Kitchen6/2011 MITRAL REGURGITATION (ICD-396.3)...mild...echo.Marland KitchenMarland Kitchen6/2011 [facility]   moderate...echo.Marland KitchenMarland Kitchen6/2011 EF  50-55%...echo.Marland KitchenMarland Kitchen6/2011 (55-60% echo 2009) DIASTOLIC DYSFUNCTION (ICD-429.9) BUNDLE BRANCH BLOCK, RIGHT (ICD-426.4)=   IRBBB; sinus [place] w/ PVCs Nuclear stress... march, 2010... no ischemia... EF 48% by echo at that time normal LV function HYPERCHOLESTEROLEMIA (ICD-272.0) HEMORRHOIDS (ICD-455.6) RENAL CALCULUS, HX OF (ICD-V13.01) DEGENERATIVE JOINT DISEASE (ICD-715.90) ANXIETY (ICD-300.00) BASAL CELL CARCINOMA, HX OF (ICD-V10.83) Atrial fibrillation..... diagnosed.. February 03, 2010   (02/15/2010 7:43) Added new observation of PRIMARY MD: Chauncey Mann (02/15/2010 7:43)       Past History:  Past Medical History: ALLERGIC RHINITIS (ICD-477.9) HYPERTENSION (ICD-401.9) AORTIC INSUFFICIENCY (ICD-424.1)....mild...echo.Marland KitchenMarland Kitchen6/2011 MITRAL REGURGITATION (ICD-396.3)...mild...echo.Marland KitchenMarland Kitchen6/2011 [facility]   moderate...echo.Marland KitchenMarland Kitchen6/2011 EF  50-55%...echo.Marland KitchenMarland Kitchen6/2011 (55-60% echo 2009) DIASTOLIC DYSFUNCTION (ICD-429.9) BUNDLE BRANCH BLOCK, RIGHT (ICD-426.4)=   IRBBB; sinus [place] w/ PVCs Nuclear stress... march, 2010... no ischemia... EF 48% by echo at that time normal LV function HYPERCHOLESTEROLEMIA (ICD-272.0) HEMORRHOIDS (ICD-455.6) RENAL CALCULUS, HX OF (ICD-V13.01) DEGENERATIVE JOINT DISEASE (ICD-715.90) ANXIETY (ICD-300.00) BASAL CELL CARCINOMA, HX OF (ICD-V10.83) Atrial fibrillation..... diagnosed.. February 03, 2010

## 2010-10-04 NOTE — Miscellaneous (Signed)
  Clinical Lists Changes  Observations: Added new observation of ECHOINTERP: Study Conclusions            - Left ventricle: The cavity size was normal. There was moderate       concentric hypertrophy. Systolic function was normal. The       estimated ejection fraction was in the range of 50% to 55%. Wall       motion was normal; there were no regional wall motion       abnormalities.     - Aortic valve: Mild regurgitation. Valve area: 3.45cm 2(VTI). Valve       area: 3.24cm 2 (Vmax).     - Mitral valve: Calcified annulus. Mildly thickened leaflets . Mild       regurgitation.     - Left atrium: The atrium was moderately dilated.     - Right atrium: The atrium was mildly dilated.     - Atrial septum: No defect or patent foramen ovale was identified.     Transthoracic echocardiography. M-mode, complete 2D, spectral     Doppler, and color Doppler. Height: Height: 182.9cm. Height: 72in.     Weight: Weight: 84.8kg. Weight: 186.6lb. Body mass index: BMI:     25.4kg/m 2. Body surface area: BSA: 2.66m 2. Blood pressure: 110/60.     Patient status: Outpatient. Location: [facility] (02/08/2010 12:26)      Echocardiogram  Procedure date:  02/08/2010  Findings:      Study Conclusions            - Left ventricle: The cavity size was normal. There was moderate       concentric hypertrophy. Systolic function was normal. The       estimated ejection fraction was in the range of 50% to 55%. Wall       motion was normal; there were no regional wall motion       abnormalities.     - Aortic valve: Mild regurgitation. Valve area: 3.45cm 2(VTI). Valve       area: 3.24cm 2 (Vmax).     - Mitral valve: Calcified annulus. Mildly thickened leaflets . Mild       regurgitation.     - Left atrium: The atrium was moderately dilated.     - Right atrium: The atrium was mildly dilated.     - Atrial septum: No defect or patent foramen ovale was identified.     Transthoracic echocardiography. M-mode,  complete 2D, spectral     Doppler, and color Doppler. Height: Height: 182.9cm. Height: 72in.     Weight: Weight: 84.8kg. Weight: 186.6lb. Body mass index: BMI:     25.4kg/m 2. Body surface area: BSA: 2.49m 2. Blood pressure: 110/60.     Patient status: Outpatient. Location: [facility]

## 2010-10-04 NOTE — Procedures (Signed)
Summary: Summary Report  Summary Report   Imported By: Erle Crocker 03/24/2010 09:54:55  _____________________________________________________________________  External Attachment:    Type:   Image     Comment:   External Document

## 2010-10-04 NOTE — Letter (Signed)
Summary: Appointment - Reminder 2  [employer], Main Office  [address]   [place], [place] [address]   Phone: [phone]  Fax: [phone]     December 12, 2009 MRN: [id number]   ANTHONE PRIEUR [address] [place], [place]  [address]   Dear Mr. Jarchow,  Our records indicate that it is time to schedule a follow-up appointment with Dr. Myrtis Ser. It is very important that we reach you to schedule this appointment. We look forward to participating in your health care needs. Please contact us at the number listed above at your earliest convenience to schedule your appointment.  If you are unable to make an appointment at this time, give [place] a call so we can update our records.     Sincerely,    Migdalia Dk [facility] Scheduling Team

## 2010-12-20 ENCOUNTER — Encounter: Payer: Self-pay | Admitting: Internal Medicine

## 2010-12-21 ENCOUNTER — Ambulatory Visit (INDEPENDENT_AMBULATORY_CARE_PROVIDER_SITE_OTHER): Payer: Medicare Other | Admitting: Internal Medicine

## 2010-12-21 ENCOUNTER — Encounter: Payer: Self-pay | Admitting: Internal Medicine

## 2010-12-21 VITALS — BP 116/70 | HR 43 | Ht 73.0 in | Wt 180.0 lb

## 2010-12-21 DIAGNOSIS — I10 Essential (primary) hypertension: Secondary | ICD-10-CM

## 2010-12-21 DIAGNOSIS — I498 Other specified cardiac arrhythmias: Secondary | ICD-10-CM

## 2010-12-21 DIAGNOSIS — I4892 Unspecified atrial flutter: Secondary | ICD-10-CM

## 2010-12-21 NOTE — Patient Instructions (Signed)
Your physician wants you to follow-up in: 12 months with Dr. Taylor. You will receive a reminder letter in the mail two months in advance. If you don't receive a letter, please call our office to schedule the follow-up appointment.    

## 2010-12-21 NOTE — Assessment & Plan Note (Signed)
His blood pressure has been well-controlled. He will continue his current medications. A low-sodium diet has been requested.

## 2010-12-21 NOTE — Assessment & Plan Note (Signed)
Despite marked bradycardia he is asymptomatic. I have recommended a period of watchful waiting.

## 2010-12-21 NOTE — Progress Notes (Signed)
HPI [name] returns today for followup. He is a 70 year old man with a history of atrial flutter as well as sinus bradycardia. He also has hypertension. Despite these problems he has been amazingly stable. He continues to be active and works her regular job. He denies chest pain or shortness of breath. He has had no syncope or near syncope. No Known Allergies   Current Outpatient Prescriptions  Medication Sig Dispense Refill  . amoxicillin (AMOXIL) 500 MG capsule 4 tabs 1 hr prior to dental procedures.       Marland Kitchen aspirin 81 MG EC tablet Take 81 mg by mouth daily.        Marland Kitchen atenolol (TENORMIN) 50 MG tablet Take 50 mg by mouth daily.        . felodipine (PLENDIL) 10 MG 24 hr tablet Take 10 mg by mouth daily.        . fish oil-omega-3 fatty acids 1000 MG capsule Take 1 g by mouth daily.        Marland Kitchen losartan-hydrochlorothiazide (HYZAAR) 100-12.5 MG per tablet Take 1 tablet by mouth daily.        . Multiple Vitamin (MULTIVITAMIN) tablet Take 1 tablet by mouth daily.        . simvastatin (ZOCOR) 40 MG tablet Take 40 mg by mouth at bedtime.           Past Medical History  Diagnosis Date  . Allergic rhinitis   . HTN (hypertension)   . Aortic insufficiency     mild... echo... 02/2010  . Mitral regurgitation     mild... echo.. 02/2010  . LVH (left ventricular hypertrophy)     moderate... echo... 02/2010  . Diastolic dysfunction     EF 50-55%... echo.. 02/2010 (55-60% echo 2009)  . Bundle branch block, right     IRBBB; sinus bradycardia w/ PVCs  . Sinus bradycardia   . PVC (premature ventricular contraction)   . Atrial fibrillation or flutter 02/03/2010    Consult by Dr. Ladona Ridgel... June, 2011.... plan rate control...  Coumadin not needed... ablation if rate not controlled well  . Hypercholesteremia   . Hemorrhoids   . Renal calculus   . Degenerative joint disease   . Anxiety   . Basal cell carcinoma     ROS:   All systems reviewed and negative except as noted in the HPI.   Past Surgical  History  Procedure Date  . Kidney stone surgery      Family History  Problem Relation Age of Onset  . Hypertension Mother      History   Social History  . Marital Status: Married    Spouse Name: N/A    Number of Children: 2  . Years of Education: N/A   Occupational History  . Not on file.   Social History Main Topics  . Smoking status: Never Smoker   . Smokeless tobacco: Not on file  . Alcohol Use: 1.8 oz/week    3 Glasses of wine per week  . Drug Use: Not on file  . Sexually Active: Not on file   Other Topics Concern  . Not on file   Social History Narrative  . No narrative on file     Pulse 43  Ht 6\' 1"  (1.854 m)  Wt 180 lb (81.647 kg)  BMI 23.75 kg/m2  Physical Exam:  Well appearing NAD HEENT: Unremarkable Neck:  No JVD, no thyromegally Lymphatics:  No adenopathy Back:  No CVA tenderness Lungs:  Clear HEART:  Regular  rate rhythm, no murmurs, no rubs, no clicks Abd:  Flat, positive bowel sounds, no organomegally, no rebound, no guarding Ext:  2 plus pulses, no edema, no cyanosis, no clubbing Skin:  No rashes no nodules Neuro:  CN II through XII intact, motor grossly intact  EKG Sinus bradycardia with incomplete right bundle branch block  Assess/Plan:

## 2010-12-21 NOTE — Assessment & Plan Note (Signed)
His symptoms are well controlled. He has minimal palpitations. His [place] score is one. Currently no indication for catheter ablation.

## 2010-12-27 ENCOUNTER — Encounter: Payer: Self-pay | Admitting: Pulmonary Disease

## 2010-12-29 ENCOUNTER — Ambulatory Visit (INDEPENDENT_AMBULATORY_CARE_PROVIDER_SITE_OTHER): Payer: Medicare Other | Admitting: Pulmonary Disease

## 2010-12-29 ENCOUNTER — Encounter: Payer: Self-pay | Admitting: Pulmonary Disease

## 2010-12-29 ENCOUNTER — Other Ambulatory Visit (INDEPENDENT_AMBULATORY_CARE_PROVIDER_SITE_OTHER): Payer: Medicare Other

## 2010-12-29 ENCOUNTER — Ambulatory Visit (INDEPENDENT_AMBULATORY_CARE_PROVIDER_SITE_OTHER)
Admission: RE | Admit: 2010-12-29 | Discharge: 2010-12-29 | Disposition: A | Discharge status: Home / Self Care | Payer: Medicare Other | Source: Ambulatory Visit | Attending: Pulmonary Disease | Admitting: Pulmonary Disease

## 2010-12-29 DIAGNOSIS — N139 Obstructive and reflux uropathy, unspecified: Secondary | ICD-10-CM

## 2010-12-29 DIAGNOSIS — Z8679 Personal history of other diseases of the circulatory system: Secondary | ICD-10-CM

## 2010-12-29 DIAGNOSIS — I359 Nonrheumatic aortic valve disorder, unspecified: Secondary | ICD-10-CM

## 2010-12-29 DIAGNOSIS — J069 Acute upper respiratory infection, unspecified: Secondary | ICD-10-CM

## 2010-12-29 DIAGNOSIS — E7800 Pure hypercholesterolemia, unspecified: Secondary | ICD-10-CM

## 2010-12-29 DIAGNOSIS — F411 Generalized anxiety disorder: Secondary | ICD-10-CM

## 2010-12-29 DIAGNOSIS — I4892 Unspecified atrial flutter: Secondary | ICD-10-CM

## 2010-12-29 DIAGNOSIS — I498 Other specified cardiac arrhythmias: Secondary | ICD-10-CM

## 2010-12-29 DIAGNOSIS — I10 Essential (primary) hypertension: Secondary | ICD-10-CM

## 2010-12-29 LAB — LIPID PANEL
LDL Cholesterol: 46 mg/dL (ref 0–99)
Total CHOL/HDL Ratio: 2
Triglycerides: 34 mg/dL (ref 0.0–149.0)
VLDL: 6.8 mg/dL (ref 0.0–40.0)

## 2010-12-29 LAB — BASIC METABOLIC PANEL
BUN: 14 mg/dL (ref 6–23)
Calcium: 9.3 mg/dL (ref 8.4–10.5)
Chloride: 102 mEq/L (ref 96–112)
Creatinine, Ser: 0.8 mg/dL (ref 0.4–1.5)
GFR: 102.89 mL/min (ref >60.00)

## 2010-12-29 LAB — PSA: PSA: 1.3 ng/mL (ref 0.10–4.00)

## 2010-12-29 LAB — CBC WITH DIFFERENTIAL/PLATELET
Basophils Relative: 0.2 % (ref 0.0–3.0)
Eosinophils Absolute: 0.1 10*3/uL (ref 0.0–0.7)
Eosinophils Relative: 0.7 % (ref 0.0–5.0)
HCT: 43.5 % (ref 39.0–52.0)
Hemoglobin: 15 g/dL (ref 13.0–17.0)
MCHC: 34.5 g/dL (ref 30.0–36.0)
MCV: 91.5 fl (ref 78.0–100.0)
Monocytes Absolute: 0.9 10*3/uL (ref 0.1–1.0)
Neutro Abs: 8.9 10*3/uL — ABNORMAL HIGH (ref 1.4–7.7)
RBC: 4.76 Mil/uL (ref 4.22–5.81)
WBC: 11.3 10*3/uL — ABNORMAL HIGH (ref 4.5–10.5)

## 2010-12-29 LAB — HEPATIC FUNCTION PANEL
Bilirubin, Direct: 0.2 mg/dL (ref 0.0–0.3)
Total Bilirubin: 1.5 mg/dL — ABNORMAL HIGH (ref 0.3–1.2)

## 2010-12-29 MED ORDER — ATENOLOL 50 MG PO TABS: ORAL_TABLET | ORAL | Status: DC

## 2010-12-29 MED ORDER — SIMVASTATIN 40 MG PO TABS: 40.0000 mg | ORAL_TABLET | Freq: Every day | ORAL | Status: DC

## 2010-12-29 MED ORDER — LOSARTAN POTASSIUM-HCTZ 100-12.5 MG PO TABS: 1.0000 | ORAL_TABLET | Freq: Every day | ORAL | Status: DC

## 2010-12-29 MED ORDER — AZITHROMYCIN 250 MG PO TABS: ORAL_TABLET | ORAL | Status: DC

## 2010-12-29 MED ORDER — FELODIPINE ER 10 MG PO TB24: 10.0000 mg | ORAL_TABLET | Freq: Every day | ORAL | Status: DC

## 2010-12-29 NOTE — Patient Instructions (Signed)
Today we updated your medication list in our EPIC system...    Continue your current meds the same...  For your URI>    We wrote a new prescription for ZPAK to take as directed...    Don't forget to take plenty of fluids, Tylenol, Mucinex, etc...  Today we did your follow up CXR & fasting blood work...    Please call the PHONE TREE in a few days for your results...    Dial [id number] & when prompted enter your patient number followed by the # symbol...    Your patient number is:  [id number]#  Call for any problems... Let's plan a brief recheck mid year in about 6 months.Marland KitchenMarland Kitchen

## 2010-12-29 NOTE — Progress Notes (Signed)
Subjective:    Patient ID: Carlos [name], male    DOB: 04/09/1941, 70 y.o.   MRN: [id number]  HPI 70 y/o WM here for a follow up visit... he has multiple medical problems as noted below...    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  BP controlled on meds;  denies CP, palpit, SOB, edema;  Chol looks good on Simva + Fish Oil;  no new complaints or concerns...   ~  June 28, 2010:  he has developed AFlutter in the interval & had evals from [name] 7/11 & [name] 10/11- 2DEcho 6/11 showed mod conc LVH, norm wall motion & EF= 50-55%, mild AI, mild thick MV leaflets w/ mild MR, mod LAdil= 42mm;  Holter 6/11 revealed AFlutter (ave rate 106, max=145, no pauses)... they rec rate control & ASA, w/ consideration of cath ablation if not controlled...  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  ~  December 29, 2010:  26mo ROV & c/o recent URI that spread thru the whole family (his symptoms are mild & we will Rx w/ ZPak);  otherw stable, feeling well, and remains active in [employer] business;  BP controleed on meds;  He recently saw [name] for f/u AFlutter, RBBB, Valv heart dis- stable, he has low resting pulse & [name] is aware, pt wants to decr Aten from 50mg  to 25mg /d OK;  Chol well controlled on the Simva40;  Requests refills & see labs below>         Problem List:  HYPERTENSION (ICD-401.9) - controlled on ATENOLOL 50mg /d,  FELODIPINE 10mg /d, & LOSARTAN/HCT 100-12.5 daily... ~  see 2DEchos below> ~  CXR 4/11 showed borderline cardiomeg, clear lungs, DJD spine, NAD;  CXR 4/12 same> NAD, WNL... ~  10/11:   BP= 110/66 & similar at home; pt denies HA, fatigue, visual changes, CP, palipit, dizziness, syncope, dyspnea, edema, etc... ~  4/12:  BP= 110/84 & he remains  asymptomatic... We decided to decr the ATENOLOL from 50mg  to 25mg /d due to his bradycardia...  VALVULAR HEART DISEASE (ICD-424.90) - followed by Delton See- he knows to take AMOX prior to dental work...  ~  2DEcho in 2002 w/ mild AI, MR, TR and normal LVF... ~  2DEcho 2/09 showed LV sl dilated w/ mild DD, norm LVF w/o regional wall motion abn & EF= 55-60%;  mild AI, mild-mod MR, dil LA @ 52mm... ~  NuclearStressTest 3/10 showed mild inferobas thinning, no ischemia, EF=48% w/ mild global HK... ~  2DEcho 4/10 showed mild AI & MR, mod dil LA, norm LVF w/ EF= 55-60% (Myoview= false result). ~  2DEcho 6/11 showed mod conc LVH, norm wall motion & EF= 50-55%, mild AI, mild thick MV leaflets w/ mild MR, mod LAdil= 42mm.  ATRIAL FLUTTER (ICD-427.32) & BUNDLE BRANCH BLOCK, RIGHT (ICD-426.4) - on ASA 325mg /d and rate control strategy...  ~  prev EKG's w/ IRBBB...2/09 w/ IVCD, NSR, & QS wave in III and aVF (new)... check LVF (nl) and wall motion (nl) on echo... ~  he developed AFlutter 2011& had evals from [name] 7/11 & [name] 10/11- 2DEcho 6/11 showed mod conc LVH, norm wall motion & EF= 50-55%, mild AI, mild thick MV leaflets w/ mild MR, mod LAdil= 42mm;  Holter 6/11  revealed AFlutter (ave rate 106, max=145, no pauses)... they rec rate control & ASA, w/ consideration of cath ablation if not controlled...  HYPERCHOLESTEROLEMIA (ICD-272.0) - on SIMVASTATIN 40mg /d, Fish Oil, & diet...  ~  FLP 1/08 showed TChol 106, TG 59, HDL 48, LDL46 ~  FLP 2/09 showed TChol 123, TG 57, HDL 51, LDL 61 ~  FLP 2/10 showed TChol 117, TG 47, HDL 55, LDL 53 ~  FLP 4/11 showed TChol 105, TG 27, HDL 57, LDL 43 ~  FLP 4/12 showed TChol 104, Tg 34, HDL 51, LDL 46  Hx of HEMORRHOIDS (ICD-455.6) - last colonoscopy in [place] 3/05 by DrNichols was neg x hems... rec f/u 5 yrs due to fam history & he asks to be seen here (refer to GI)... ~  12/10: had f/u colonoscopy by DrStark- 3mm sessile polyp in cecum= path pending...  +mild  divertics...   RENAL CALCULUS, HX OF (ICD-V13.01) - surg in 1974 for kid stone... no recurrence...  DEGENERATIVE JOINT DISEASE (ICD-715.90) - occas neck discomfort without arm pain, numbness, etc... he is very active...  ANXIETY (ICD-300.00)  BASAL CELL CARCINOMA, HX OF (ICD-V10.83) - he follow up w/ Derm in [place] every 3-28months.  Health Maintenance: ~  GI:  DrStark w/ colon 12/10 as above... ~  GU:  4/11> DRE= neg & PSA= 1.50 ~  Immunizations:  he gets yearly Flu shots,  ?Pneumovax,  ?Tetanus...   Past Surgical History  Procedure Date  . Kidney stone surgery     Outpatient Encounter Prescriptions as of 12/29/2010  Medication Sig Dispense Refill  . amoxicillin (AMOXIL) 500 MG capsule 4 tabs 1 hr prior to dental procedures.       Marland Kitchen aspirin 81 MG EC tablet Take 81 mg by mouth daily.        Marland Kitchen atenolol (TENORMIN) 50 MG tablet Take 50 mg by mouth daily.        . felodipine (PLENDIL) 10 MG 24 hr tablet Take 10 mg by mouth daily.        . fish oil-omega-3 fatty acids 1000 MG capsule Take 1 g by mouth daily.        Marland Kitchen losartan-hydrochlorothiazide (HYZAAR) 100-12.5 MG per tablet Take 1 tablet by mouth daily.        . Multiple Vitamin (MULTIVITAMIN) tablet Take 1 tablet by mouth daily.        . simvastatin (ZOCOR) 40 MG tablet Take 40 mg by mouth at bedtime.          No Known Allergies   Review of Systems         See HPI - all other systems neg except as noted... The patient denies anorexia, fever, weight loss, weight gain, vision loss, decreased hearing, hoarseness, chest pain, syncope, dyspnea on exertion, peripheral edema, prolonged cough, headaches, hemoptysis, abdominal pain, melena, hematochezia, severe indigestion/heartburn, hematuria, incontinence, muscle weakness, suspicious skin lesions, transient blindness, difficulty walking, depression, unusual weight change, abnormal bleeding, enlarged lymph nodes, and angioedema.    Objective:   Physical Exam       WD, WN, 70 y/o  WM in NAD... GENERAL:  Alert & oriented; pleasant & cooperative... HEENT:  [place]/AT, EOM-wnl, PERRLA, Fundi-benign, EACs-clear, TMs-wnl, NOSE-clear, THROAT-clear & wnl. NECK:  Supple w/ fairROM; no JVD; normal carotid impulses w/o bruits; no thyromegaly or nodules palpated; no lymphadenopathy. CHEST:  Clear to P & A; without wheezes/ rales/ or rhonchi. HEART:  Regular Rhythm;  gr1/6 SEM LSB without rubs or gallops heard... ABDOMEN:  Soft &  nontender; normal bowel sounds; no organomegaly or masses detected. (RECTAL:  Neg - prostate 2+ & nontender w/o nodules; stool hematest neg) EXT: without deformities or arthritic changes; no varicose veins/ venous insuffic/ or edema. NEURO:  CN's intact; motor testing normal; sensory testing normal; gait normal & balance OK. DERM:  No lesions noted; no rash etc...   Assessment & Plan:   URI>  Mild symptoms compared to other family members; Rx w/ rest, fluids, ZPak, OTC prn meds...  HBP>  Controlled on meds above & we decided to decrthe Aten to 25mg /d due to bradycardia...  Hx AFlutter, RBBB, Valv heart dis>  Followed by [name] & [name],,,  CHOL>  Stable on diet + Simva40...  Other medical issues as noted.Marland KitchenMarland Kitchen

## 2011-01-07 ENCOUNTER — Encounter: Payer: Self-pay | Admitting: Pulmonary Disease

## 2011-01-16 NOTE — Assessment & Plan Note (Signed)
[facility]                            CARDIOLOGY OFFICE NOTE   JOURDAIN, GUAY                       MRN:          [id number]  DATE:12/01/2008                            DOB:          07-20-1941    ADDENDUM   I have reviewed the hard copy images of the stress Myoview scan and the  EKG tracings for Mr. Kostelecky.  As outlined in the report, the patient  does have decreased activity at the base of the inferior wall.  There is  no definite ischemia.  The left ventricle does appear to be somewhat  enlarged.  EKGs revealed that with exercise, the patient's PVCs actually  disappear.  His overall exercise tolerance is somewhat limited.  In the  recovery period, he has 3 brief episodes of 3 beats of supraventricular  tachycardia.  This is not ventricular tachycardia.  As his heart rate  slows down, he then has a return of some scattered PVCs.   Therefore, he does not have stress-induced increase in his ventricular  ectopy.  He does develop 4-beat bursts of supraventricular tachycardia  early in recovery.     Luis Abed, MD, [facility]     JDK/MedQ  DD: 12/01/2008  DT: 12/02/2008  Job #: [id number]

## 2011-01-16 NOTE — Assessment & Plan Note (Signed)
[facility]                            CARDIOLOGY OFFICE NOTE   Carlos, [name]                       MRN:          [id number]  DATE:10/27/2008                            DOB:          [birth year]    Carlos [name] is a pleasant active 70 year old gentleman who is here for  Cardiology followup.  He has seen Dr. Graceann Congress many years ago.  He  is followed carefully by Dr. Kriste Basque.  He does have some mild valvular  heart disease.  He also has some bradycardia.  Therefore, I referred for  further evaluation.  He does not have chest pain.  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  He loves it and he feels good, but he does not having  any problems.  He does not have syncope or presyncope.  He did see Dr.  Kriste Basque, who noted that he had relative bradycardia and he is here for  further evaluation.  He does have a history of hypertension, that is  well treated.  He also has hypercholesterolemia that is treated.   PAST MEDICAL HISTORY:   ALLERGIES:  No known drug allergies.   MEDICATIONS:  1. Atenolol 25.  2. Felodipine 10.  3. Diovan hydrochlorothiazide 160/12.5.  4. Simvastatin 40.  5. Fish oil.  6. Multivitamin.  7. Aspirin 81.   OTHER MEDICAL PROBLEMS:  See the list below.   REVIEW OF SYSTEMS:  He is not having any GI or GU symptoms.  He has no  fevers or chills.  There are no skin rashes.  He has no headaches.  His  review of systems otherwise negative.   PHYSICAL EXAMINATION:  VITAL SIGNS:  Blood pressure is 130/70 with a  pulse of 74.  The patient is oriented to person, time, and place.  Affect is normal.  HEENT:  No xanthelasma.  He has normal extraocular  motion.  NECK:  There are no carotid bruits.  There is no jugular venous  distention.  LUNGS:  Clear.  Respiratory effort is not labored.  CARDIAC:  An S1 with an S2.  There is a soft systolic murmur.  ABDOMEN:  Soft.  His  abdominal pulsation is slightly increased.  EXTREMITIES:  He has 2+ distal pulses.  There is no significant  peripheral edema.   EKG reveals incomplete right bundle-branch block.  He has had this  before.  He has sinus bradycardia with a PAC.  He also has several PVCs  noted on today's EKG.  EKG from October 20, 2008, revealed sinus  bradycardia, but PVCs were not noted.   PROBLEMS:  1. History of hypertension, treated.  2. Ejection fraction was 55-60% (by 2-D echo in 2009).  3. Left atrial dilatation 52 mm.  4. Mild-to-moderate mitral regurgitation.  5. Mild aortic insufficiency.  6. Mild diastolic dysfunction.  7. Hypercholesterolemia, treated.  8. History of renal calculus.  9. Incomplete right bundle branch block.  10.Sinus bradycardia.  I believe that this is not a significant  problem for him.  11.Several premature ventricular contractions seen on today's EKG.      Carlos [name] does have risk factors for coronary artery disease.  He      does not have any significant symptoms.  However with the PVCs that      are noted today, we will proceed with a stress Myoview scan.  I do      want him to walk on the treadmill, so that we can see specifically      what happens to his PVCs with exercise.  We will also rule out      ischemia.  12.Prominent abdominal aortic pulsation.  He will have an abdominal      ultrasound to rule out of an abdominal aortic aneurysm.  I will      then see him back for followup.     Luis Abed, MD, [facility]  Electronically Signed    JDK/MedQ  DD: 10/27/2008  DT: 10/28/2008  Job #: [id number]   cc:   Lonzo Cloud. Kriste Basque, MD

## 2011-01-16 NOTE — Assessment & Plan Note (Signed)
[facility]                            CARDIOLOGY OFFICE NOTE   Carlos, [name]                       MRN:          [id number]  DATE:12/01/2008                            DOB:          1941-02-15    Carlos [name] is seen for a Cardiology followup.  I saw him in consultation  on October 27, 2008.  At that time, he had had some PVCs and decision  was made to proceed with a stress Myoview scan.  I wanted to see what  happens to his ectopy with walking and also to rule out significant  ischemia.  Also, he had a pulsatile aorta and decision was made to do an  abdominal aortic ultrasound.  The ultrasound reveals that he has normal  caliber aorta and other vessels.  There is no evidence of significant  plaque.  With his exercise, he walked for 6 minutes.  He was limited by  fatigue.  There was no chest pain.  There were no significant ST  changes.  There were frequent PVCs.  I will review the exact strips and  add an addendum also.  There was no significant ischemia.  There was  decreased activity in the inferobasal wall at rest and stress.  Ejection  fraction was 48%.  Overall, it was a low risk scan.  There was no stress-  induced ventricular ectopy, but I need to review the rhythms.   Today in the office, Carlos [name] is feeling well.  He is expecting his  first grandchild as the day goes on.  He has not had any chest pain.   PAST MEDICAL HISTORY:   ALLERGIES:  No known drug allergies.   MEDICATIONS:  Atenolol, felodipine, Diovan, hydrochlorothiazide,  simvastatin, fish oil, multivitamin, and aspirin.   OTHER MEDICAL PROBLEMS:  See the list on my note of October 27, 2008.   REVIEW OF SYSTEMS:  He is not having any fevers or chills.  He has no  headaches.  He has no skin rashes.  He has no GI or GU symptoms.  Otherwise, his review of systems is negative.  All systems were reviewed  and are negative.   PHYSICAL EXAMINATION:  VITAL SIGNS:  Blood  pressure today is 102/62.  His heart rate is 50.  GENERAL:  The patient is oriented to person, time, and place.  Affect is  normal.  HEENT:  No xanthelasma.  He has normal extraocular motion.  There are no  carotid bruits.  There is no jugular venous distention.  LUNGS:  Clear.  Respiratory effort is not labored.  CARDIAC:  S1 and S2.  There are no clicks or significant murmurs.  ABDOMEN:  Soft.  He has no significant peripheral edema.   Problems are listed on my note of October 27, 2008.  #2.  Prior ejection fraction at 55-60%.  Current ejection fraction by  nuclear scan 48%.  He will have a 2-D echo to reassess left ventricular  function.  #4.  Mild-to-moderate mitral regurgitation and mild aortic  insufficiency.  Echo will also give [place] more information about this.  #  10.  Sinus bradycardia.  This is not a problem for him.  #11.  Premature ventricular contractions.  This does not appear to  represent ischemia.  I will review all of his strips.  #12.  Prominent abdominal aortic pulsation.  His abdominal ultrasound is  normal.   He will have a 2-D echo and I will see him for followup.  I will also re-  review the strips from his exercise test.   ADDENDUM:  I have reviewed the hard copy images of the stress Myoview  scan and the EKG tracings for Carlos [name].  As outlined in the report,  the patient does have decreased activity at the base of the inferior  wall.  There is no definite ischemia.  The left ventricle does appear to  be somewhat enlarged.  EKGs revealed that with exercise, the patient's  PVCs actually disappear.  His overall exercise tolerance is somewhat  limited.  In the recovery period, he has 3 brief episodes of 3 beats of  supraventricular tachycardia.  This is not ventricular tachycardia.  As  his heart rate slows down, he then has a return of some scattered PVCs.   Therefore, he does not have stress-induced increase in his ventricular  ectopy.  He does develop  4-beat bursts of supraventricular tachycardia  early in recovery.     Carlos Abed, MD, [facility]  Electronically Signed    JDK/MedQ  DD: 12/01/2008  DT: 12/02/2008  Job #: [id number]

## 2011-01-24 ENCOUNTER — Other Ambulatory Visit: Payer: Self-pay | Admitting: Pulmonary Disease

## 2011-06-06 ENCOUNTER — Ambulatory Visit: Payer: Medicare Other | Admitting: Cardiology

## 2011-06-08 ENCOUNTER — Encounter: Payer: Self-pay | Admitting: Cardiology

## 2011-06-08 DIAGNOSIS — E785 Hyperlipidemia, unspecified: Secondary | ICD-10-CM | POA: Insufficient documentation

## 2011-06-08 DIAGNOSIS — R001 Bradycardia, unspecified: Secondary | ICD-10-CM | POA: Insufficient documentation

## 2011-06-08 DIAGNOSIS — I10 Essential (primary) hypertension: Secondary | ICD-10-CM | POA: Insufficient documentation

## 2011-06-08 DIAGNOSIS — I517 Cardiomegaly: Secondary | ICD-10-CM | POA: Insufficient documentation

## 2011-06-08 DIAGNOSIS — I5189 Other ill-defined heart diseases: Secondary | ICD-10-CM | POA: Insufficient documentation

## 2011-06-08 DIAGNOSIS — I493 Ventricular premature depolarization: Secondary | ICD-10-CM | POA: Insufficient documentation

## 2011-06-08 DIAGNOSIS — I340 Nonrheumatic mitral (valve) insufficiency: Secondary | ICD-10-CM | POA: Insufficient documentation

## 2011-06-11 ENCOUNTER — Encounter: Payer: Self-pay | Admitting: Cardiology

## 2011-06-11 ENCOUNTER — Ambulatory Visit (INDEPENDENT_AMBULATORY_CARE_PROVIDER_SITE_OTHER): Payer: Medicare Other | Admitting: Cardiology

## 2011-06-11 VITALS — BP 130/71 | HR 51 | Ht 73.0 in | Wt 182.0 lb

## 2011-06-11 DIAGNOSIS — R9430 Abnormal result of cardiovascular function study, unspecified: Secondary | ICD-10-CM | POA: Insufficient documentation

## 2011-06-11 DIAGNOSIS — I5189 Other ill-defined heart diseases: Secondary | ICD-10-CM

## 2011-06-11 DIAGNOSIS — I519 Heart disease, unspecified: Secondary | ICD-10-CM

## 2011-06-11 DIAGNOSIS — R079 Chest pain, unspecified: Secondary | ICD-10-CM | POA: Insufficient documentation

## 2011-06-11 DIAGNOSIS — I4892 Unspecified atrial flutter: Secondary | ICD-10-CM

## 2011-06-11 DIAGNOSIS — I4510 Unspecified right bundle-branch block: Secondary | ICD-10-CM | POA: Insufficient documentation

## 2011-06-11 DIAGNOSIS — I498 Other specified cardiac arrhythmias: Secondary | ICD-10-CM

## 2011-06-11 DIAGNOSIS — R001 Bradycardia, unspecified: Secondary | ICD-10-CM

## 2011-06-11 NOTE — Assessment & Plan Note (Signed)
Patient is not having any significant palpitations.  He continues on medication.  There is no indication for ablation at this point.  There is no need for anticoagulation at this point.  I did mention that at age 70 there would be further consideration.

## 2011-06-11 NOTE — Assessment & Plan Note (Signed)
There may be some mild diastolic dysfunction.  However he has no significant symptoms and he is stable.  No further workup.

## 2011-06-11 NOTE — Patient Instructions (Signed)
Your physician wants you to follow-up in: 1 Year. You will receive a reminder letter in the mail two months in advance. If you don't receive a letter, please call our office to schedule the follow-up appointment.  

## 2011-06-11 NOTE — Assessment & Plan Note (Signed)
Patient has sinus bradycardia.  He has no symptoms from this.  No further workup.

## 2011-06-11 NOTE — Progress Notes (Signed)
HPI  Patient is seen for followup in paroxysmal atrial flutter.  Rate control has been planned.  He does not yet require anticoagulation.  I saw him last in October, 2011.  He saw Dr. Ladona Ridgel April, 2012.  He does very well.  He is active.  He does not have syncope or presyncope.  He has no significant palpitations.  There is no chest pain or shortness of breath. No Known Allergies  Current Outpatient Prescriptions  Medication Sig Dispense Refill  . amoxicillin (AMOXIL) 500 MG capsule 4 tabs 1 hr prior to dental procedures.       Marland Kitchen aspirin 81 MG EC tablet Take 81 mg by mouth daily.        Marland Kitchen atenolol (TENORMIN) 50 MG tablet Take as directed  90 tablet  3  . azithromycin (ZITHROMAX) 250 MG tablet Take 2 tablets by mouth on day 1, followed by 1 tablet by mouth daily for 4 days. Take as directed  1 each  0  . felodipine (PLENDIL) 10 MG 24 hr tablet TAKE 1 TABLET EVERY DAY  30 tablet  PRN  . losartan-hydrochlorothiazide (HYZAAR) 100-12.5 MG per tablet Take 1 tablet by mouth daily.  90 tablet  3  . Multiple Vitamin (MULTIVITAMIN) tablet Take 1 tablet by mouth daily.        . simvastatin (ZOCOR) 40 MG tablet Take 1 tablet (40 mg total) by mouth at bedtime.  90 tablet  3  . fish oil-omega-3 fatty acids 1000 MG capsule Take 1 g by mouth daily.          History   Social History  . Marital Status: Married    Spouse Name: Banjamin Stovall    Number of Children: 2  . Years of Education: N/A   Occupational History  . Not on file.   Social History Main Topics  . Smoking status: Never Smoker   . Smokeless tobacco: Never Used  . Alcohol Use: 1.8 oz/week    3 Glasses of wine per week     3 glasses of wine per week  . Drug Use: No  . Sexually Active: Not on file   Other Topics Concern  . Not on file   Social History Narrative  . No narrative on file    Family History  Problem Relation Age of Onset  . Hypertension Mother     Past Medical History  Diagnosis Date  . Allergic rhinitis   . HTN  (hypertension)   . Aortic insufficiency     mild... echo... 02/2010  . Mitral regurgitation     mild... echo.. 02/2010  . LVH (left ventricular hypertrophy)     moderate... echo... 02/2010  . Diastolic dysfunction     EF 50-55%... echo.. 02/2010 (55-60% echo 2009)  . Bundle branch block, right     IRBBB; sinus bradycardia w/ PVCs  . Sinus bradycardia   . PVC (premature ventricular contraction)   . Atrial flutter 02/03/2010    Consult by Dr. Ladona Ridgel... June, 2011.... plan rate control...  Coumadin not needed... ablation if rate not controlled well  . Dyslipidemia   . Hemorrhoids   . Renal calculus   . Degenerative joint disease   . Anxiety   . Basal cell carcinoma   . Ejection fraction     EF 50-55%, echo, June, 2011  . Chest pain     Nuclear, March, 2010, no ischemia    Past Surgical History  Procedure Date  . Kidney stone surgery  ROS  Patient denies fever, chills, headache, sweats, rash, change in vision, change in hearing, chest pain, cough, nausea vomiting, urinary symptoms.  All other systems are reviewed and are negative. PHYSICAL EXAM Patient looks quite stable.  He is oriented to person time and place.  Affect is normal.  He is here with his wife.  Head is atraumatic.  No jugular venous distention.  Lungs are clear.  Respiratory effort is unlabored.  Cardiac exam reveals S1 and S2.  No clicks or significant murmurs.  The abdomen is soft.  No peripheral edema. Filed Vitals:   06/11/11 1350  BP: 130/71  Pulse: 51  Height: 6\' 1"  (1.854 m)  Weight: 182 lb (82.555 kg)    EKG is done today and reviewed by me.  There is increased voltage.  Her sinus bradycardia.  No significant change.  ASSESSMENT & PLAN

## 2011-07-04 ENCOUNTER — Encounter: Payer: Self-pay | Admitting: Pulmonary Disease

## 2011-07-04 ENCOUNTER — Ambulatory Visit (INDEPENDENT_AMBULATORY_CARE_PROVIDER_SITE_OTHER): Payer: Medicare Other | Admitting: Pulmonary Disease

## 2011-07-04 DIAGNOSIS — M1990 Unspecified osteoarthritis, unspecified site: Secondary | ICD-10-CM

## 2011-07-04 DIAGNOSIS — I340 Nonrheumatic mitral (valve) insufficiency: Secondary | ICD-10-CM

## 2011-07-04 DIAGNOSIS — E785 Hyperlipidemia, unspecified: Secondary | ICD-10-CM

## 2011-07-04 DIAGNOSIS — I4892 Unspecified atrial flutter: Secondary | ICD-10-CM

## 2011-07-04 DIAGNOSIS — I10 Essential (primary) hypertension: Secondary | ICD-10-CM

## 2011-07-04 DIAGNOSIS — I059 Rheumatic mitral valve disease, unspecified: Secondary | ICD-10-CM

## 2011-07-04 DIAGNOSIS — F411 Generalized anxiety disorder: Secondary | ICD-10-CM

## 2011-07-04 NOTE — Patient Instructions (Signed)
Today we updated your med list in our EPIC system...    Continue your current medications the same...  We gave you the 2012 Flu vaccine today...  Stay as active as possible & BE CAREFUL!!!  Let's plana follow up visit in 6 months w/ Fasting labs at that time.Marland KitchenMarland Kitchen

## 2011-07-16 ENCOUNTER — Encounter: Payer: Self-pay | Admitting: Pulmonary Disease

## 2011-07-16 NOTE — Progress Notes (Signed)
Subjective:    Patient ID: Carlos [name], male    DOB: 25-Mar-1941, 70 y.o.   MRN: [id number]  HPI  70 y/o WM here for a follow up visit... he has multiple medical problems as noted below...    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  BP controlled on meds;  denies CP, palpit, SOB, edema;  Chol looks good on Simva + Fish Oil;  no new complaints or concerns...   ~  June 28, 2010:  he has developed AFlutter in the interval & had evals from [name] 7/11 & [name] 10/11- 2DEcho 6/11 showed mod conc LVH, norm wall motion & EF= 50-55%, mild AI, mild thick MV leaflets w/ mild MR, mod LAdil= 42mm;  Holter 6/11 revealed AFlutter (ave rate 106, max=145, no pauses)... they rec rate control & ASA, w/ consideration of cath ablation if not controlled...  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  ~  December 29, 2010:  71mo ROV & c/o recent URI that spread thru the whole family (his symptoms are mild & we will Rx w/ ZPak);  otherw stable, feeling well, and remains active in [employer] business;  BP controlled on meds;  He recently saw [name] for f/u AFlutter, RBBB, Valv heart dis- stable, he has low resting pulse & [name] is aware, pt wants to decr Aten from 50mg  to 25mg /d OK;  Chol well controlled on the Simva40...  ~  July 04, 2011:  71mo ROV & his CC is allergy flair w/ the Autumn leaves etc (OTC Rx); no new complaints or concerns- stable on current meds; BP controlled (he went back to 50mg  Aten daily)- denies CP, palpit, SOB, edema, etc; he saw [facility] 10/12 for f/u PAFlutter- he has sinus brady, doing well & asymptomatic, f/u planned 71yr; he sees DrHoover for Derm- total body exam done 8/12 & several AKs removed;  OK flu shot today... See prob list below:         Problem  List:  HYPERTENSION (ICD-401.9) - controlled on ATENOLOL 50mg /d,  FELODIPINE 10mg /d, & LOSARTAN/HCT 100-12.5 daily... ~  see 2DEchos below> ~  CXR 4/11 showed borderline cardiomeg, clear lungs, DJD spine, NAD;  CXR 4/12 same> NAD, WNL... ~  10/11:   BP= 110/66 & similar at home; pt denies HA, fatigue, visual changes, CP, palipit, dizziness, syncope, dyspnea, edema, etc... ~  4/12:  BP= 110/84 & he remains asymptomatic... We decided to decr the ATENOLOL from 50mg  to 25mg /d due to his bradycardia... ~  10/12:  BP= 120/72, Pulse= 50 & regular; he went back on his Aten50/d + other the same; he remains asymptomatic w/o CP, palpit, dizzy, syncope, SOB, edema, etc...  VALVULAR HEART DISEASE (ICD-424.90) - followed by Delton See- pt knows to take AMOX prior to dental work...  ~  2DEcho in 2002 w/ mild AI, MR, TR and normal LVF... ~  2DEcho 2/09 showed LV sl dilated w/ mild DD, norm LVF w/o regional wall motion abn & EF= 55-60%;  mild AI, mild-mod MR, dil LA @ 52mm... ~  NuclearStressTest 3/10 showed mild inferobas thinning, no ischemia, EF=48% w/ mild global HK... ~  2DEcho 4/10 showed mild AI & MR, mod dil LA, norm LVF w/ EF= 55-60% (Myoview= false  result). ~  2DEcho 6/11 showed mod conc LVH, norm wall motion & EF= 50-55%, mild AI, mild thick MV leaflets w/ mild MR, mod LAdil= 42mm.  ATRIAL FLUTTER (ICD-427.32) & BUNDLE BRANCH BLOCK, RIGHT (ICD-426.4) - on ASA 325mg /d and rate control strategy...  ~  prev EKG's w/ IRBBB...2/09 w/ IVCD, NSR, & QS wave in III and aVF (new)... check LVF (nl) and wall motion (nl) on echo... ~  he developed AFlutter 2011& had evals from [name] 7/11 & [name] 10/11- 2DEcho 6/11 showed mod conc LVH, norm wall motion & EF= 50-55%, mild AI, mild thick MV leaflets w/ mild MR, mod LAdil= 42mm;  Holter 6/11 revealed AFlutter (ave rate 106, max=145, no pauses)... they rec rate control & ASA, w/ consideration of cath ablation if not controlled...  HYPERCHOLESTEROLEMIA (ICD-272.0)  - on SIMVASTATIN 40mg /d, Fish Oil, & diet...  ~  FLP 1/08 showed TChol 106, TG 59, HDL 48, LDL46 ~  FLP 2/09 showed TChol 123, TG 57, HDL 51, LDL 61 ~  FLP 2/10 showed TChol 117, TG 47, HDL 55, LDL 53 ~  FLP 4/11 showed TChol 105, TG 27, HDL 57, LDL 43 ~  FLP 4/12 showed TChol 104, Tg 34, HDL 51, LDL 46  Hx of HEMORRHOIDS (ICD-455.6) - last colonoscopy in [place] 3/05 by DrNichols was neg x hems... rec f/u 5 yrs due to fam history & he asks to be seen here (refer to GI)... ~  12/10: had f/u colonoscopy by DrStark- 3mm sessile polyp in cecum= path pending...  +mild divertics...   RENAL CALCULUS, HX OF (ICD-V13.01) - surg in 1974 for kid stone... no recurrence...  DEGENERATIVE JOINT DISEASE (ICD-715.90) - occas neck discomfort without arm pain, numbness, etc... he is very active...  ANXIETY (ICD-300.00)  BASAL CELL CARCINOMA, HX OF (ICD-V10.83) - he follow up w/ Derm in [place] every 3-59months. ~  8/12: note from DrWHoover- full body check & several AKs removed...  Health Maintenance: ~  GI:  DrStark w/ colon 12/10 as above... ~  GU:  4/11> DRE= neg & PSA= 1.50 ~  Immunizations:  he gets yearly Flu shots,  ?Pneumovax,  ?Tetanus...   Past Surgical History  Procedure Date  . Kidney stone surgery     Outpatient Encounter Prescriptions as of 07/04/2011  Medication Sig Dispense Refill  . amoxicillin (AMOXIL) 500 MG capsule 4 tabs 1 hr prior to dental procedures.       Marland Kitchen aspirin 81 MG EC tablet Take 81 mg by mouth daily.        Marland Kitchen atenolol (TENORMIN) 50 MG tablet Take as directed  90 tablet  3  . felodipine (PLENDIL) 10 MG 24 hr tablet TAKE 1 TABLET EVERY DAY  30 tablet  PRN  . fish oil-omega-3 fatty acids 1000 MG capsule Take 1 g by mouth daily.        Marland Kitchen losartan-hydrochlorothiazide (HYZAAR) 100-12.5 MG per tablet Take 1 tablet by mouth daily.  90 tablet  3  . Multiple Vitamin (MULTIVITAMIN) tablet Take 1 tablet by mouth daily.        . simvastatin (ZOCOR) 40 MG tablet Take 1 tablet  (40 mg total) by mouth at bedtime.  90 tablet  3  . DISCONTD: azithromycin (ZITHROMAX) 250 MG tablet Take 2 tablets by mouth on day 1, followed by 1 tablet by mouth daily for 4 days. Take as directed  1 each  0    No Known Allergies   Review of Systems  See HPI - all other systems neg except as noted... The patient denies anorexia, fever, weight loss, weight gain, vision loss, decreased hearing, hoarseness, chest pain, syncope, dyspnea on exertion, peripheral edema, prolonged cough, headaches, hemoptysis, abdominal pain, melena, hematochezia, severe indigestion/heartburn, hematuria, incontinence, muscle weakness, suspicious skin lesions, transient blindness, difficulty walking, depression, unusual weight change, abnormal bleeding, enlarged lymph nodes, and angioedema.    Objective:   Physical Exam       WD, WN, 70 y/o WM in NAD... GENERAL:  Alert & oriented; pleasant & cooperative... HEENT:  [place]/AT, EOM-wnl, PERRLA, Fundi-benign, EACs-clear, TMs-wnl, NOSE-clear, THROAT-clear & wnl. NECK:  Supple w/ fairROM; no JVD; normal carotid impulses w/o bruits; no thyromegaly or nodules palpated; no lymphadenopathy. CHEST:  Clear to P & A; without wheezes/ rales/ or rhonchi. HEART:  Regular Rhythm;  gr1/6 SEM LSB without rubs or gallops heard... ABDOMEN:  Soft & nontender; normal bowel sounds; no organomegaly or masses detected. (RECTAL:  Neg - prostate 2+ & nontender w/o nodules; stool hematest neg) EXT: without deformities or arthritic changes; no varicose veins/ venous insuffic/ or edema. NEURO:  CN's intact; motor testing normal; sensory testing normal; gait normal & balance OK. DERM:  No lesions noted; no rash etc...   Assessment & Plan:   HBP>  Controlled on above; continue same...  Hx AFlutter, RBBB, Valv heart dis>  Followed by [name] & [name]; stable, continue same Rx...  CHOL>  Stable on diet + Simva40; continue same...  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  Other medical issues as noted.Marland KitchenMarland Kitchen

## 2012-01-02 ENCOUNTER — Encounter: Payer: Self-pay | Admitting: Pulmonary Disease

## 2012-01-02 ENCOUNTER — Other Ambulatory Visit (INDEPENDENT_AMBULATORY_CARE_PROVIDER_SITE_OTHER): Payer: Medicare Other

## 2012-01-02 ENCOUNTER — Ambulatory Visit (INDEPENDENT_AMBULATORY_CARE_PROVIDER_SITE_OTHER): Payer: Medicare Other | Admitting: Pulmonary Disease

## 2012-01-02 VITALS — BP 122/80 | HR 41 | Temp 96.6°F | Ht 72.0 in | Wt 176.4 lb

## 2012-01-02 DIAGNOSIS — Z23 Encounter for immunization: Secondary | ICD-10-CM

## 2012-01-02 DIAGNOSIS — F419 Anxiety disorder, unspecified: Secondary | ICD-10-CM

## 2012-01-02 DIAGNOSIS — I4892 Unspecified atrial flutter: Secondary | ICD-10-CM

## 2012-01-02 DIAGNOSIS — E785 Hyperlipidemia, unspecified: Secondary | ICD-10-CM

## 2012-01-02 DIAGNOSIS — M1990 Unspecified osteoarthritis, unspecified site: Secondary | ICD-10-CM

## 2012-01-02 DIAGNOSIS — J309 Allergic rhinitis, unspecified: Secondary | ICD-10-CM

## 2012-01-02 DIAGNOSIS — N139 Obstructive and reflux uropathy, unspecified: Secondary | ICD-10-CM

## 2012-01-02 DIAGNOSIS — I10 Essential (primary) hypertension: Secondary | ICD-10-CM

## 2012-01-02 DIAGNOSIS — Z85828 Personal history of other malignant neoplasm of skin: Secondary | ICD-10-CM

## 2012-01-02 DIAGNOSIS — I059 Rheumatic mitral valve disease, unspecified: Secondary | ICD-10-CM

## 2012-01-02 DIAGNOSIS — I340 Nonrheumatic mitral (valve) insufficiency: Secondary | ICD-10-CM

## 2012-01-02 DIAGNOSIS — I4510 Unspecified right bundle-branch block: Secondary | ICD-10-CM

## 2012-01-02 DIAGNOSIS — F411 Generalized anxiety disorder: Secondary | ICD-10-CM

## 2012-01-02 LAB — HEPATIC FUNCTION PANEL
ALT: 24 U/L (ref 0–53)
Total Protein: 7.2 g/dL (ref 6.0–8.3)

## 2012-01-02 LAB — CBC WITH DIFFERENTIAL/PLATELET
Basophils Relative: 1.2 % (ref 0.0–3.0)
Eosinophils Absolute: 0.1 10*3/uL (ref 0.0–0.7)
Eosinophils Relative: 2.2 % (ref 0.0–5.0)
Lymphocytes Relative: 33.5 % (ref 12.0–46.0)
MCHC: 33.4 g/dL (ref 30.0–36.0)
Neutrophils Relative %: 53.7 % (ref 43.0–77.0)
RBC: 5.21 Mil/uL (ref 4.22–5.81)
WBC: 5.3 10*3/uL (ref 4.5–10.5)

## 2012-01-02 LAB — TSH: TSH: 1.05 u[IU]/mL (ref 0.35–5.50)

## 2012-01-02 LAB — LIPID PANEL
Cholesterol: 112 mg/dL (ref 0–200)
Triglycerides: 39 mg/dL (ref 0.0–149.0)

## 2012-01-02 LAB — BASIC METABOLIC PANEL
BUN: 17 mg/dL (ref 6–23)
CO2: 32 mEq/L (ref 19–32)
Chloride: 102 mEq/L (ref 96–112)
Creatinine, Ser: 1 mg/dL (ref 0.4–1.5)

## 2012-01-02 MED ORDER — FELODIPINE ER 10 MG PO TB24: 10.0000 mg | ORAL_TABLET | Freq: Every day | ORAL | Status: DC

## 2012-01-02 MED ORDER — SIMVASTATIN 40 MG PO TABS: 40.0000 mg | ORAL_TABLET | Freq: Every day | ORAL | Status: DC

## 2012-01-02 MED ORDER — LOSARTAN POTASSIUM-HCTZ 100-12.5 MG PO TABS: 1.0000 | ORAL_TABLET | Freq: Every day | ORAL | Status: DC

## 2012-01-02 MED ORDER — ATENOLOL 50 MG PO TABS: ORAL_TABLET | ORAL | Status: DC

## 2012-01-02 NOTE — Patient Instructions (Signed)
Today we updated your med list in our EPIC system...    Continue your current medications the same...    We refilled your meds per request...  Today we did your follow up FASTING blood work...    We will call you w/ the results in a few days...  We gave you the combination Tetanus vaccine called the TDAP today (it is good for 21yrs)...  Call for any questions...  Let's plan a follow up visit in 6 months.Marland KitchenMarland Kitchen

## 2012-01-02 NOTE — Progress Notes (Signed)
Subjective:    Patient ID: Carlos [name], male    DOB: 1941/02/20, 71 y.o.   MRN: [id number]  HPI 71 y/o WM here for a follow up visit... he has multiple medical problems as noted below...    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  BP controlled on meds;  denies CP, palpit, SOB, edema;  Chol looks good on Simva + Fish Oil;  no new complaints or concerns...   ~  June 28, 2010:  he has developed AFlutter in the interval & had evals from [name] 7/11 & [name] 10/11- 2DEcho 6/11 showed mod conc LVH, norm wall motion & EF= 50-55%, mild AI, mild thick MV leaflets w/ mild MR, mod LAdil= 42mm;  Holter 6/11 revealed AFlutter (ave rate 106, max=145, no pauses)... they rec rate control & ASA, w/ consideration of cath ablation if not controlled...  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  ~  December 29, 2010:  22mo ROV & c/o recent URI that spread thru the whole family (his symptoms are mild & we will Rx w/ ZPak);  otherw stable, feeling well, and remains active in [employer] business;  BP controlled on meds;  He recently saw [name] for f/u AFlutter, RBBB, Valv heart dis- stable, he has low resting pulse & [name] is aware, pt wants to decr Aten from 50mg  to 25mg /d OK;  Chol well controlled on the Simva40...  ~  July 04, 2011:  22mo ROV & his CC is allergy flair w/ the Autumn leaves etc (OTC Rx); no new complaints or concerns- stable on current meds; BP controlled (he went back to 50mg  Aten daily)- denies CP, palpit, SOB, edema, etc; he saw [facility] 10/12 for f/u PAFlutter- he has sinus brady, doing well & asymptomatic, f/u planned 58yr; he sees DrHoover for Derm- total body exam done 8/12 & several AKs removed;  OK flu shot today... See prob list below:  ~  Jan 02, 2012:  22mo  ROV & Carlos [name] has had a good interval- no new complaints or concerns;  He is due for refill of all meds & needs a TDAP vaccination today;  We reviewed his prob list, meds, xrays & labs> see below>> LABS 5/13:  FLP- at goals on Simva40;  Chems- ok x BS=106;  CBC- wnl;  TSH=1.05;  PSA=1.54         Problem List:  HYPERTENSION (ICD-401.9) - controlled on ATENOLOL 50mg /d,  FELODIPINE 10mg /d, & LOSARTAN/HCT 100-12.5 daily... ~  see 2DEcho below> ~  CXR 4/11 showed borderline cardiomeg, clear lungs, DJD spine, NAD;  CXR 4/12 same> NAD, WNL... ~  10/11:   BP= 110/66 & similar at home; pt denies HA, fatigue, visual changes, CP, palipit, dizziness, syncope, dyspnea, edema, etc... ~  4/12:  BP= 110/84 & he remains asymptomatic... We decided to decr the ATENOLOL from 50mg  to 25mg /d due to his bradycardia... ~  10/12:  BP= 120/72, Pulse= 50 & regular; he went back on his Aten50/d; he remains asymptomatic w/o CP, palpit, dizzy, syncope, SOB, edema, etc... ~  5/13:  BP= 122/80 & he remains bradycardic but doesn't want to cut the BBlocker; denies CP, palpit, dizzy, syncope, SOB, etc.  VALVULAR HEART DISEASE (ICD-424.90) - followed by [name]/Taylor- pt  knows to take AMOX prior to dental work...  ~  2DEcho in 2002 w/ mild AI, MR, TR and normal LVF... ~  2DEcho 2/09 showed LV sl dilated w/ mild DD, norm LVF w/o regional wall motion abn & EF= 55-60%;  mild AI, mild-mod MR, dil LA @ 52mm... ~  NuclearStressTest 3/10 showed mild inferobas thinning, no ischemia, EF=48% w/ mild global HK... ~  2DEcho 4/10 showed mild AI & MR, mod dil LA, norm LVF w/ EF= 55-60% (Myoview= false result). ~  2DEcho 6/11 showed mod conc LVH, norm wall motion & EF= 50-55%, mild AI, mild thick MV leaflets w/ mild MR, mod LAdil= 42mm. ~  10/12: he saw [name] for Cards f/u> doing satis, not on anticoagulation, no changes made...  ATRIAL FLUTTER (ICD-427.32) & BUNDLE BRANCH BLOCK, RIGHT (ICD-426.4) - on ASA 325mg /d and rate control strategy...  ~   prev EKG's w/ IRBBB...2/09 w/ IVCD, NSR, & QS wave in III and aVF (new)... check LVF (nl) and wall motion (nl) on echo... ~  he developed AFlutter 2011 & had evals from [name] 7/11 & [name] 10/11-  they rec rate control & ASA, w/ consideration of cath ablation if not controlled... ~  2DEcho 6/11 showed mod conc LVH, norm wall motion & EF= 50-55%, mild AI, mild thick MV leaflets w/ mild MR, mod LAdil= 42mm;   ~  Holter 6/11 revealed AFlutter (ave rate 106, max=145, no pauses)...  HYPERCHOLESTEROLEMIA (ICD-272.0) - on SIMVASTATIN 40mg /d, Fish Oil, & diet...  ~  FLP 1/08 showed TChol 106, TG 59, HDL 48, LDL46 ~  FLP 2/09 showed TChol 123, TG 57, HDL 51, LDL 61 ~  FLP 2/10 showed TChol 117, TG 47, HDL 55, LDL 53 ~  FLP 4/11 showed TChol 105, TG 27, HDL 57, LDL 43 ~  FLP 4/12 on Simva40 showed TChol 104, Tg 34, HDL 51, LDL 46 ~  FLP 5/13 on Simva40 showed TChol 112, TG 39, HDL 55, LDL 49  Hx of HEMORRHOIDS (ICD-455.6) - last colonoscopy in [place] 3/05 by DrNichols was neg x hems... rec f/u 5 yrs due to fam history & he asks to be seen here (refer to GI)... ~  12/10: had f/u colonoscopy by DrStark- 3mm sessile polyp in cecum= path showed fragments of tubular adenoma; +mild divertics...   RENAL CALCULUS, HX OF (ICD-V13.01) - surg in 1974 for kid stone... no recurrence...  DEGENERATIVE JOINT DISEASE (ICD-715.90) - occas neck discomfort without arm pain, numbness, etc... he is very active...  ANXIETY (ICD-300.00)  BASAL CELL CARCINOMA, HX OF (ICD-V10.83) - he follow up w/ Derm in [place] every 3-46months. ~  8/12: note from DrWHoover- full body check & several AKs removed...  Health Maintenance: ~  GI:  DrStark w/ colon 12/10 as above... ~  GU:  4/11> DRE= neg & PSA= 1.50 ~  Immunizations:  he gets yearly Flu shots,  ?Pneumovax,  Given TDAP 5/13...   Past Surgical History  Procedure Date  . Kidney stone surgery     Outpatient Encounter Prescriptions as of 01/02/2012  Medication Sig  Dispense Refill  . amoxicillin (AMOXIL) 500 MG capsule 4 tabs 1 hr prior to dental procedures.       Marland Kitchen aspirin 81 MG EC tablet Take 81 mg by mouth daily.        Marland Kitchen atenolol (TENORMIN) 50 MG tablet Take as directed  90 tablet  3  . felodipine (PLENDIL) 10 MG 24 hr tablet TAKE 1 TABLET EVERY DAY  30 tablet  PRN  . fish oil-omega-3 fatty acids 1000 MG capsule Take 1 g by mouth daily.        Marland Kitchen losartan-hydrochlorothiazide (HYZAAR) 100-12.5 MG per tablet Take 1 tablet by mouth daily.  90 tablet  3  . Multiple Vitamin (MULTIVITAMIN) tablet Take 1 tablet by mouth daily.        . simvastatin (ZOCOR) 40 MG tablet Take 1 tablet (40 mg total) by mouth at bedtime.  90 tablet  3    No Known Allergies   Current Medications, Allergies, Past Medical History, Past Surgical History, Family History, and Social History were reviewed in [employer] record.    Review of Systems         See HPI - all other systems neg except as noted... The patient denies anorexia, fever, weight loss, weight gain, vision loss, decreased hearing, hoarseness, chest pain, syncope, dyspnea on exertion, peripheral edema, prolonged cough, headaches, hemoptysis, abdominal pain, melena, hematochezia, severe indigestion/heartburn, hematuria, incontinence, muscle weakness, suspicious skin lesions, transient blindness, difficulty walking, depression, unusual weight change, abnormal bleeding, enlarged lymph nodes, and angioedema.    Objective:   Physical Exam       WD, WN, 71 y/o WM in NAD... GENERAL:  Alert & oriented; pleasant & cooperative... HEENT:  [place]/AT, EOM-wnl, PERRLA, Fundi-benign, EACs-clear, TMs-wnl, NOSE-clear, THROAT-clear & wnl. NECK:  Supple w/ fairROM; no JVD; normal carotid impulses w/o bruits; no thyromegaly or nodules palpated; no lymphadenopathy. CHEST:  Clear to P & A; without wheezes/ rales/ or rhonchi. HEART:  Regular Rhythm;  gr1/6 SEM LSB without rubs or gallops heard... ABDOMEN:  Soft &  nontender; normal bowel sounds; no organomegaly or masses detected. (RECTAL:  Neg - prostate 2+ & nontender w/o nodules; stool hematest neg) EXT: without deformities or arthritic changes; no varicose veins/ venous insuffic/ or edema. NEURO:  CN's intact; motor testing normal; sensory testing normal; gait normal & balance OK. DERM:  No lesions noted; no rash etc...  RADIOLOGY DATA:  Reviewed in the EPIC EMR & discussed w/ the patient...  LABORATORY DATA:  Reviewed in the EPIC EMR & discussed w/ the patient...    Assessment & Plan:   HBP>  Controlled on above; continue same...  Hx AFlutter, RBBB, Valv heart dis>  Followed by [name] & [name]; stable, continue same Rx...  CHOL>  Stable on diet + Simva40; continue same...  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  Other medical issues as noted...    Patient's Medications  New Prescriptions   No medications on file  Previous Medications   AMOXICILLIN (AMOXIL) 500 MG CAPSULE    4 tabs 1 hr prior to dental procedures.    ASPIRIN 81 MG EC TABLET    Take 81 mg by mouth daily.     FISH OIL-OMEGA-3 FATTY ACIDS 1000 MG CAPSULE    Take 1 g by mouth daily.     MULTIPLE VITAMIN (MULTIVITAMIN) TABLET    Take 1 tablet by mouth daily.    Modified Medications   Modified Medication Previous Medication   ATENOLOL (TENORMIN) 50 MG TABLET atenolol (TENORMIN) 50 MG tablet      Take as directed    Take as directed   FELODIPINE (PLENDIL) 10 MG 24 HR TABLET felodipine (PLENDIL) 10 MG 24 hr tablet      Take 1 tablet (10 mg total) by mouth daily.    TAKE 1 TABLET EVERY DAY   LOSARTAN-HYDROCHLOROTHIAZIDE (HYZAAR) 100-12.5 MG  PER TABLET losartan-hydrochlorothiazide (HYZAAR) 100-12.5 MG per tablet      Take 1 tablet by mouth daily.    Take 1 tablet by mouth daily.   SIMVASTATIN (ZOCOR) 40 MG TABLET simvastatin (ZOCOR) 40 MG tablet      Take 1 tablet (40 mg total) by mouth at bedtime.    Take 1  tablet (40 mg total) by mouth at bedtime.  Discontinued Medications   No medications on file

## 2012-01-16 ENCOUNTER — Encounter: Payer: Self-pay | Admitting: Internal Medicine

## 2012-01-16 ENCOUNTER — Ambulatory Visit (INDEPENDENT_AMBULATORY_CARE_PROVIDER_SITE_OTHER): Payer: Medicare Other | Admitting: Internal Medicine

## 2012-01-16 VITALS — BP 118/68 | HR 52 | Ht 73.0 in | Wt 177.0 lb

## 2012-01-16 DIAGNOSIS — I4949 Other premature depolarization: Secondary | ICD-10-CM

## 2012-01-16 DIAGNOSIS — I493 Ventricular premature depolarization: Secondary | ICD-10-CM

## 2012-01-16 DIAGNOSIS — R001 Bradycardia, unspecified: Secondary | ICD-10-CM

## 2012-01-16 DIAGNOSIS — I498 Other specified cardiac arrhythmias: Secondary | ICD-10-CM

## 2012-01-16 NOTE — Progress Notes (Signed)
HPI Carlos [name] returns today for followup. He is a very pleasant 71 year old man with palpitations and documented paroxysmal atrial fibrillation as well as brief episodes of bradycardia. He has been nearly asymptomatic. He denies chest pain, shortness of breath, syncope, and continues to work vigorously at [employer]. He is able to keep up with men much younger than himself. No Known Allergies   Current Outpatient Prescriptions  Medication Sig Dispense Refill  . aspirin 81 MG EC tablet Take 81 mg by mouth daily.        Marland Kitchen atenolol (TENORMIN) 50 MG tablet Take as directed  90 tablet  3  . felodipine (PLENDIL) 10 MG 24 hr tablet Take 1 tablet (10 mg total) by mouth daily.  90 tablet  3  . fish oil-omega-3 fatty acids 1000 MG capsule Take 1 g by mouth daily.        Marland Kitchen losartan-hydrochlorothiazide (HYZAAR) 100-12.5 MG per tablet Take 1 tablet by mouth daily.  90 tablet  3  . Multiple Vitamin (MULTIVITAMIN) tablet Take 1 tablet by mouth daily.        . simvastatin (ZOCOR) 40 MG tablet Take 1 tablet (40 mg total) by mouth at bedtime.  90 tablet  3  . amoxicillin (AMOXIL) 500 MG capsule 4 tabs 1 hr prior to dental procedures.          Past Medical History  Diagnosis Date  . Allergic rhinitis   . HTN (hypertension)   . Aortic insufficiency     mild... echo... 02/2010  . Mitral regurgitation     mild... echo.. 02/2010  . LVH (left ventricular hypertrophy)     moderate... echo... 02/2010  . Diastolic dysfunction     EF 50-55%... echo.. 02/2010 (55-60% echo 2009)  . Incomplete RBBB     IRBBB; sinus bradycardia w/ PVCs  . Sinus bradycardia   . PVC (premature ventricular contraction)   . Atrial flutter 02/03/2010    Consult by Dr. Ladona Ridgel... June, 2011.... plan rate control...  Coumadin not needed... ablation if rate not controlled well  . Dyslipidemia   . Hemorrhoids   . Renal calculus   . Degenerative joint disease   . Anxiety   . Basal cell carcinoma   . Ejection fraction     EF 50-55%,  echo, June, 2011  . Chest pain     Nuclear, March, 2010, no ischemia    ROS:   All systems reviewed and negative except as noted in the HPI.   Past Surgical History  Procedure Date  . Kidney stone surgery      Family History  Problem Relation Age of Onset  . Hypertension Mother      History   Social History  . Marital Status: Married    Spouse Name: Keiji Melland    Number of Children: 2  . Years of Education: N/A   Occupational History  . Not on file.   Social History Main Topics  . Smoking status: Never Smoker   . Smokeless tobacco: Never Used  . Alcohol Use: 1.8 oz/week    3 Glasses of wine per week     3 glasses of wine per week  . Drug Use: No  . Sexually Active: Not on file   Other Topics Concern  . Not on file   Social History Narrative  . No narrative on file     BP 118/68  Pulse 52  Ht 6\' 1"  (1.854 m)  Wt 177 lb (80.287 kg)  BMI 23.35  kg/m2  Physical Exam:  Well appearing NAD HEENT: Unremarkable Neck:  No JVD, no thyromegally Lymphatics:  No adenopathy Back:  No CVA tenderness Lungs:  Clear with no wheezes.  HEART:  Regular rate rhythm, no murmurs, no rubs, no clicks Abd:  soft, positive bowel sounds, no organomegally, no rebound, no guarding Ext:  2 plus pulses, no edema, no cyanosis, no clubbing Skin:  No rashes no nodules Neuro:  CN II through XII intact, motor grossly intact   Assess/Plan:

## 2012-01-16 NOTE — Patient Instructions (Signed)
Your physician wants you to follow-up in: 2 years with Dr. Taylor. You will receive a reminder letter in the mail two months in advance. If you don't receive a letter, please call our office to schedule the follow-up appointment.  Your physician recommends that you continue on your current medications as directed. Please refer to the Current Medication list given to you today.  

## 2012-01-16 NOTE — Assessment & Plan Note (Signed)
He is minimally symptomatic. Will follow.

## 2012-01-16 NOTE — Assessment & Plan Note (Signed)
He is asymptomatic. No indication for PPM.  

## 2012-04-17 IMAGING — CR DG CHEST 2V
2 series · 2 of 2 positions shown · non-contrast
Comparison: 12/28/2009

CLINICAL DATA: Hypertension.

CHEST - 2 VIEW

[view not recorded (1 of 2)]
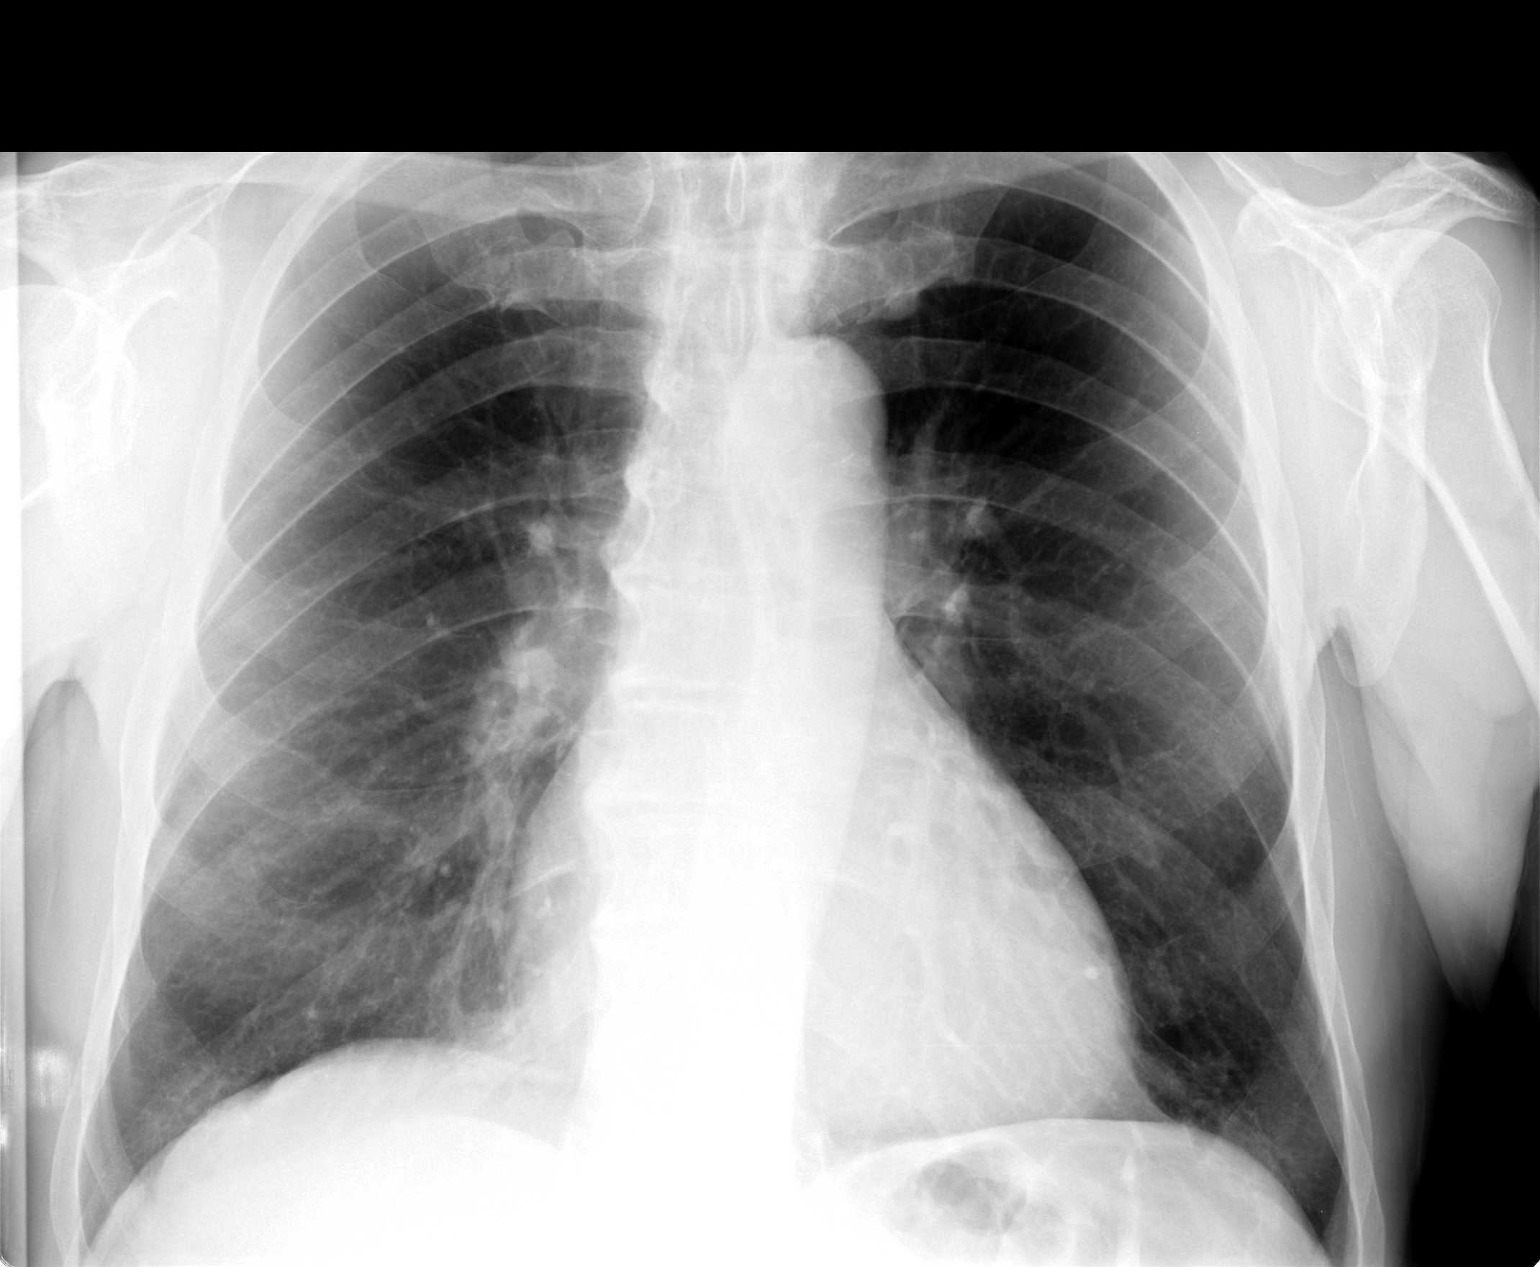

[view not recorded (2 of 2)]
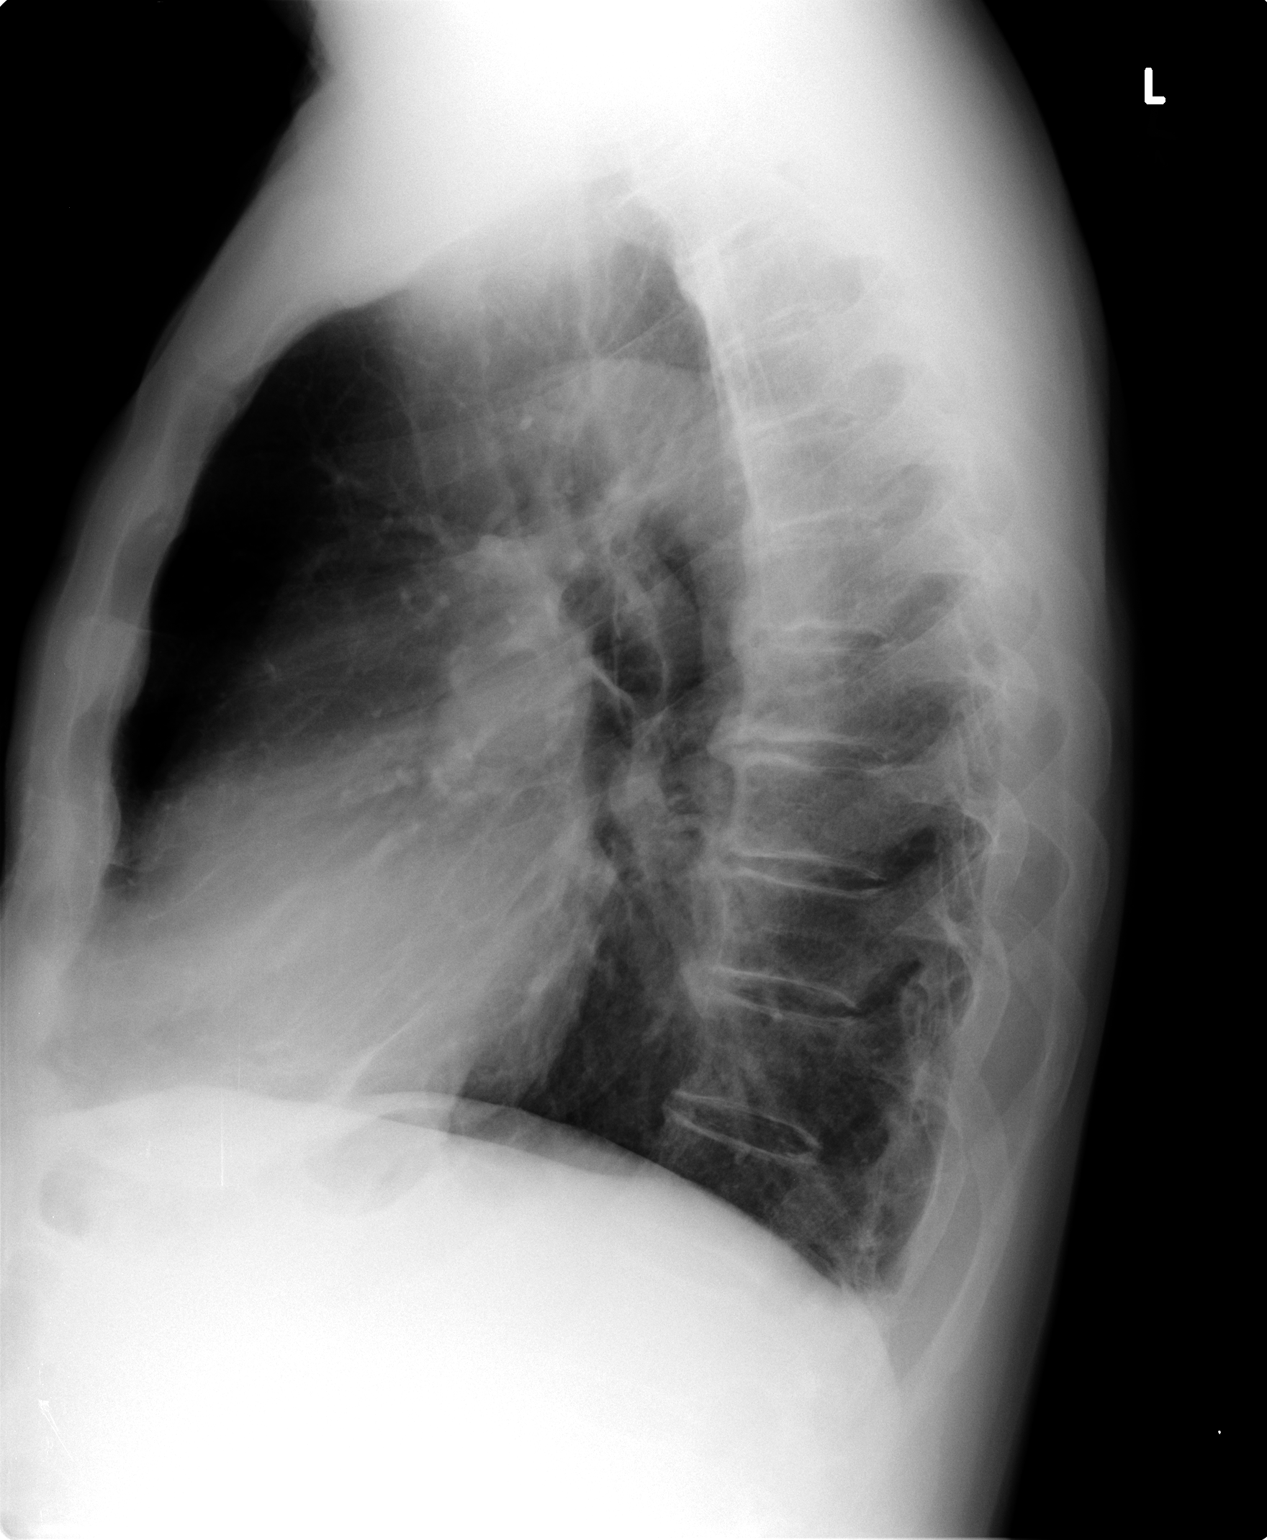

[2 of 2 positions shown; findings below may reference images not displayed]

FINDINGS: The heart size and mediastinal contours are within
normal limits.  Both lungs are clear.  The visualized skeletal
structures are unremarkable.
IMPRESSION: No active cardiopulmonary disease.

## 2012-07-04 ENCOUNTER — Ambulatory Visit (INDEPENDENT_AMBULATORY_CARE_PROVIDER_SITE_OTHER): Payer: Medicare Other | Admitting: Pulmonary Disease

## 2012-07-04 ENCOUNTER — Encounter: Payer: Self-pay | Admitting: Pulmonary Disease

## 2012-07-04 ENCOUNTER — Encounter: Payer: Self-pay | Admitting: *Deleted

## 2012-07-04 VITALS — BP 112/82 | HR 42 | Temp 97.2°F | Ht 72.0 in | Wt 178.2 lb

## 2012-07-04 DIAGNOSIS — E785 Hyperlipidemia, unspecified: Secondary | ICD-10-CM

## 2012-07-04 DIAGNOSIS — I4892 Unspecified atrial flutter: Secondary | ICD-10-CM

## 2012-07-04 DIAGNOSIS — M1990 Unspecified osteoarthritis, unspecified site: Secondary | ICD-10-CM

## 2012-07-04 DIAGNOSIS — I059 Rheumatic mitral valve disease, unspecified: Secondary | ICD-10-CM

## 2012-07-04 DIAGNOSIS — Z85828 Personal history of other malignant neoplasm of skin: Secondary | ICD-10-CM

## 2012-07-04 DIAGNOSIS — I340 Nonrheumatic mitral (valve) insufficiency: Secondary | ICD-10-CM

## 2012-07-04 DIAGNOSIS — I10 Essential (primary) hypertension: Secondary | ICD-10-CM

## 2012-07-04 NOTE — Progress Notes (Signed)
Subjective:    Patient ID: Carlos [name], male    DOB: 06-Sep-1940, 71 y.o.   MRN: [id number]  HPI 71 y/o WM here for a follow up visit... he has multiple medical problems as noted below...    ~  June 28, 2010:  he has developed AFlutter in the interval & had evals from [name] 7/11 & [name] 10/11- 2DEcho 6/11 showed mod conc LVH, norm wall motion & EF= 50-55%, mild AI, mild thick MV leaflets w/ mild MR, mod LAdil= 42mm;  Holter 6/11 revealed AFlutter (ave rate 106, max=145, no pauses)... they rec rate control & ASA, w/ consideration of cath ablation if not controlled...  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  ~  December 29, 2010:  97mo ROV & c/o recent URI that spread thru the whole family (his symptoms are mild & we will Rx w/ ZPak);  otherw stable, feeling well, and remains active in [employer] business;  BP controlled on meds;  He recently saw [name] for f/u AFlutter, RBBB, Valv heart dis- stable, he has low resting pulse & [name] is aware, pt wants to decr Aten from 50mg  to 25mg /d OK;  Chol well controlled on the Simva40...  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    HBP> on Aten50, Plendil10, Hyzaar100-12.5;  BP= 112/82 & denies CP, palpit, dizzy, SOB, edema, etc...    Valv Heart Dis> on Amoxicillin Prn for SBE prophy...    AFlutter, RBBB> on ASA81; denies CP, palpit, dizzy, etc; he had f/u [name] 5/13- doing well & no need for pacer...    Chol> on Simva40, FishOil; on diet & wt stable- FLP 5/13 showed TChol 112, TG 39, HDL 55, LDL 49    GI- polyps, hems> he remains asymptomatic w/o abd pain, n/v, c/d, blood seen...    Kidney stones> he remains asymptomatic w/o pain, dysuria, blood, etc...    DJD> on OTC analgesics as needed; he continues to work hard, min arthritic symptoms, using OTC meds as needed...    Anxiety> doing satis, not on meds...    Skin cancer> he continues to f/u w/ Derm Q12mo... We reviewed prob list, meds,  xrays and labs> see below for updates >> ok Flu shot today...         Problem List:  HYPERTENSION (ICD-401.9) - controlled on ATENOLOL 50mg /d,  FELODIPINE 10mg /d, & LOSARTAN/HCT 100-12.5 daily... ~  see 2DEcho below> ~  CXR 4/11 showed borderline cardiomeg, clear lungs, DJD spine, NAD;  CXR 4/12 same> NAD, WNL... ~  10/11:   BP= 110/66 & similar at home; pt denies HA, fatigue, visual changes, CP, palipit, dizziness, syncope, dyspnea, edema, etc... ~  4/12:  BP= 110/84 & he remains asymptomatic... We decided to decr the ATENOLOL from 50mg  to 25mg /d due to his bradycardia... ~  CXR 4/12 showed normal heart size, clear lungs, NAD... ~  10/12:  BP= 120/72, Pulse= 50 & regular; he went back on his Aten50/d; he remains asymptomatic w/o CP, palpit, dizzy, syncope, SOB, edema, etc... ~  5/13:  BP= 122/80 & he remains bradycardic but doesn't  want to cut the BBlocker; denies CP, palpit, dizzy, syncope, SOB, etc. ~  11/13: on Aten50, Plendil10, Hyzaar100-12.5;  BP= 112/82 & denies CP, palpit, dizzy, SOB, edema, etc...  VALVULAR HEART DISEASE (ICD-424.90) - followed by [name]/Taylor- pt knows to take AMOX prior to dental work...  ~  2DEcho in 2002 w/ mild AI, MR, TR and normal LVF... ~  2DEcho 2/09 showed LV sl dilated w/ mild DD, norm LVF w/o regional wall motion abn & EF= 55-60%;  mild AI, mild-mod MR, dil LA @ 52mm... ~  NuclearStressTest 3/10 showed mild inferobas thinning, no ischemia, EF=48% w/ mild global HK... ~  2DEcho 4/10 showed mild AI & MR, mod dil LA, norm LVF w/ EF= 55-60% (Myoview= false result). ~  2DEcho 6/11 showed mod conc LVH, norm wall motion & EF= 50-55%, mild AI, mild thick MV leaflets w/ mild MR, mod LAdil= 42mm. ~  10/12: he saw [name] for Cards f/u> doing satis, not on anticoagulation, no changes made...  ATRIAL FLUTTER (ICD-427.32) & BUNDLE BRANCH BLOCK, RIGHT (ICD-426.4) - on ASA 325mg /d and rate control strategy...  ~  prev EKG's w/ IRBBB...2/09 w/ IVCD, NSR, & QS wave in III and aVF (new)... check LVF (nl) and wall motion (nl) on echo... ~  he developed AFlutter 2011 & had evals from [name] 7/11 & [name] 10/11-  they rec rate control & ASA, w/ consideration of cath ablation if not controlled... ~  2DEcho 6/11 showed mod conc LVH, norm wall motion & EF= 50-55%, mild AI, mild thick MV leaflets w/ mild MR, mod LAdil= 42mm;   ~  Holter 6/11 revealed AFlutter (ave rate 106, max=145, no pauses)... ~  5/13: he saw [name]- doing well, no symptoms, no indication for pacer...   HYPERCHOLESTEROLEMIA (ICD-272.0) - on SIMVASTATIN 40mg /d, Fish Oil, & diet...  ~  FLP 1/08 showed TChol 106, TG 59, HDL 48, LDL46 ~  FLP 2/09 showed TChol 123, TG 57, HDL 51, LDL 61 ~  FLP 2/10 showed TChol 117, TG 47, HDL 55, LDL 53 ~  FLP 4/11 showed TChol 105, TG 27, HDL 57, LDL 43 ~  FLP 4/12 on Simva40 showed TChol 104, Tg 34, HDL  51, LDL 46 ~  FLP 5/13 on Simva40 showed TChol 112, TG 39, HDL 55, LDL 49  Hx of HEMORRHOIDS (ICD-455.6) - last colonoscopy in [place] 3/05 by DrNichols was neg x hems... rec f/u 5 yrs due to fam history & he asks to be seen here (refer to GI)... ~  12/10: had f/u colonoscopy by DrStark- 3mm sessile polyp in  cecum= path showed fragments of tubular adenoma; +mild divertics...   RENAL CALCULUS, HX OF (ICD-V13.01) - surg in 1974 for kid stone... no recurrence...  DEGENERATIVE JOINT DISEASE (ICD-715.90) - occas neck discomfort without arm pain, numbness, etc... he is very active...  ANXIETY (ICD-300.00)  BASAL CELL CARCINOMA, HX OF (ICD-V10.83) - he follow up w/ Derm in [place] every 3-10months. ~  8/12: note from DrWHoover- full body check & several AKs removed... ~  He continues to f/u w/ Derm every 30mo he says...  Health Maintenance: ~  GI:  DrStark w/ colon 12/10 as above... ~  GU:  4/11> DRE= neg & PSA= 1.50 ~  Immunizations:  he gets yearly Flu shots,  ?Pneumovax,  Given TDAP 5/13...   Past Surgical History  Procedure Date  . Kidney stone surgery     Outpatient Encounter Prescriptions as of 07/04/2012  Medication Sig Dispense Refill  . amoxicillin (AMOXIL) 500 MG capsule 4 tabs 1 hr prior to dental procedures.       Marland Kitchen aspirin 81 MG EC tablet Take 81 mg by mouth daily.        Marland Kitchen atenolol (TENORMIN) 50 MG tablet Take as directed  90 tablet  3  . felodipine (PLENDIL) 10 MG 24 hr tablet Take 1 tablet (10 mg total) by mouth daily.  90 tablet  3  . fish oil-omega-3 fatty acids 1000 MG capsule Take 1 g by mouth daily.        Marland Kitchen losartan-hydrochlorothiazide (HYZAAR) 100-12.5 MG per tablet Take 1 tablet by mouth daily.  90 tablet  3  . Multiple Vitamin (MULTIVITAMIN) tablet Take 1 tablet by mouth daily.        . simvastatin (ZOCOR) 40 MG tablet Take 1 tablet (40 mg total) by mouth at bedtime.  90 tablet  3    No Known Allergies   Current Medications, Allergies, Past Medical History,  Past Surgical History, Family History, and Social History were reviewed in [employer] record.    Review of Systems         See HPI - all other systems neg except as noted... The patient denies anorexia, fever, weight loss, weight gain, vision loss, decreased hearing, hoarseness, chest pain, syncope, dyspnea on exertion, peripheral edema, prolonged cough, headaches, hemoptysis, abdominal pain, melena, hematochezia, severe indigestion/heartburn, hematuria, incontinence, muscle weakness, suspicious skin lesions, transient blindness, difficulty walking, depression, unusual weight change, abnormal bleeding, enlarged lymph nodes, and angioedema.    Objective:   Physical Exam     WD, WN, 71 y/o WM in NAD... GENERAL:  Alert & oriented; pleasant & cooperative... HEENT:  [place]/AT, EOM-wnl, PERRLA, Fundi-benign, EACs-clear, TMs-wnl, NOSE-clear, THROAT-clear & wnl. NECK:  Supple w/ fairROM; no JVD; normal carotid impulses w/o bruits; no thyromegaly or nodules palpated; no lymphadenopathy. CHEST:  Clear to P & A; without wheezes/ rales/ or rhonchi. HEART:  Regular Rhythm;  gr1/6 SEM LSB without rubs or gallops heard... ABDOMEN:  Soft & nontender; normal bowel sounds; no organomegaly or masses detected. (RECTAL:  Neg - prostate 2+ & nontender w/o nodules; stool hematest neg) EXT: without deformities or arthritic changes; no varicose veins/ venous insuffic/ or edema. NEURO:  CN's intact; motor testing normal; sensory testing normal; gait normal & balance OK. DERM:  No lesions noted; no rash etc...  RADIOLOGY DATA:  Reviewed in the EPIC EMR & discussed w/ the patient...  LABORATORY DATA:  Reviewed in the EPIC EMR & discussed w/ the patient...    Assessment & Plan:  HBP>  Controlled on above; continue same...  Hx AFlutter, RBBB, Valv heart dis>  Followed by [name] & [name]; stable, continue same Rx...  CHOL>  Stable on diet + Simva40; continue same...  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  Other medical issues as noted...    Patient's Medications  New Prescriptions   No medications on file  Previous Medications   AMOXICILLIN (AMOXIL) 500 MG CAPSULE    4 tabs 1 hr prior to dental procedures.    ASPIRIN 81 MG EC TABLET    Take 81 mg by mouth daily.     ATENOLOL (TENORMIN) 50 MG TABLET    Take as directed   FELODIPINE (PLENDIL) 10 MG 24 HR TABLET    Take 1 tablet (10 mg total) by mouth daily.   FISH OIL-OMEGA-3 FATTY ACIDS 1000 MG CAPSULE    Take 1 g by mouth daily.     LOSARTAN-HYDROCHLOROTHIAZIDE (HYZAAR) 100-12.5 MG PER TABLET    Take 1 tablet by mouth daily.   MULTIPLE VITAMIN (MULTIVITAMIN) TABLET    Take 1 tablet by mouth daily.     SIMVASTATIN (ZOCOR) 40 MG TABLET    Take 1 tablet (40 mg total) by mouth at bedtime.  Modified Medications   No medications on file  Discontinued Medications   No medications on file

## 2012-07-04 NOTE — Patient Instructions (Addendum)
Today we updated your med list in our EPIC system...    Continue your current medications the same...  We gave you the 2013 Flu vaccine today...  Keep up the great job w/ diet & exercise...  Call for any problems...  Let's plan a follow up visit in 6 months w/ FASTING blood work & CXR at that time.Marland KitchenMarland Kitchen

## 2012-07-09 ENCOUNTER — Encounter: Payer: Self-pay | Admitting: Cardiology

## 2012-07-09 ENCOUNTER — Ambulatory Visit (INDEPENDENT_AMBULATORY_CARE_PROVIDER_SITE_OTHER): Payer: Medicare Other | Admitting: Cardiology

## 2012-07-09 VITALS — BP 112/60 | HR 48 | Resp 16 | Ht 74.0 in | Wt 173.8 lb

## 2012-07-09 DIAGNOSIS — I10 Essential (primary) hypertension: Secondary | ICD-10-CM

## 2012-07-09 DIAGNOSIS — R079 Chest pain, unspecified: Secondary | ICD-10-CM

## 2012-07-09 DIAGNOSIS — I4892 Unspecified atrial flutter: Secondary | ICD-10-CM

## 2012-07-09 NOTE — Patient Instructions (Addendum)
Your physician wants you to follow-up in: 1 year. You will receive a reminder letter in the mail two months in advance. If you don't receive a letter, please call our office to schedule the follow-up appointment.  

## 2012-07-09 NOTE — Progress Notes (Signed)
Patient ID: Carlos [name], male   DOB: 1941-03-31, 71 y.o.   MRN: [id number]   HPI  Patient is seen today for followup of mild sinus bradycardia in for her history of atrial flutter. In the past he said some paroxysmal atrial flutter. By usual criteria he does not yet require anticoagulation. He alternates his cardiology visits between me and Dr. Ladona Ridgel. When he saw Dr. Ladona Ridgel in the past the decision was made to follow him medically. He has resting sinus bradycardia. He is very active. He has no significant symptoms.  No Known Allergies  Current Outpatient Prescriptions  Medication Sig Dispense Refill  . amoxicillin (AMOXIL) 500 MG capsule 4 tabs 1 hr prior to dental procedures.       Marland Kitchen aspirin 81 MG EC tablet Take 81 mg by mouth daily.        Marland Kitchen atenolol (TENORMIN) 50 MG tablet Take as directed  90 tablet  3  . felodipine (PLENDIL) 10 MG 24 hr tablet Take 1 tablet (10 mg total) by mouth daily.  90 tablet  3  . fish oil-omega-3 fatty acids 1000 MG capsule Take 1 g by mouth daily.        Marland Kitchen losartan-hydrochlorothiazide (HYZAAR) 100-12.5 MG per tablet Take 1 tablet by mouth daily.  90 tablet  3  . Multiple Vitamin (MULTIVITAMIN) tablet Take 1 tablet by mouth daily.        . simvastatin (ZOCOR) 40 MG tablet Take 1 tablet (40 mg total) by mouth at bedtime.  90 tablet  3    History   Social History  . Marital Status: Married    Spouse Name: Rand Forseth    Number of Children: 2  . Years of Education: N/A   Occupational History  . Not on file.   Social History Main Topics  . Smoking status: Never Smoker   . Smokeless tobacco: Never Used  . Alcohol Use: 1.8 oz/week    3 Glasses of wine per week     Comment: 3 glasses of wine per week  . Drug Use: No  . Sexually Active: Not on file   Other Topics Concern  . Not on file   Social History Narrative  . No narrative on file    Family History  Problem Relation Age of Onset  . Hypertension Mother     Past Medical History  Diagnosis  Date  . Allergic rhinitis   . HTN (hypertension)   . Aortic insufficiency     mild... echo... 02/2010  . Mitral regurgitation     mild... echo.. 02/2010  . LVH (left ventricular hypertrophy)     moderate... echo... 02/2010  . Diastolic dysfunction     EF 50-55%... echo.. 02/2010 (55-60% echo 2009)  . Incomplete RBBB     IRBBB; sinus bradycardia w/ PVCs  . Sinus bradycardia   . PVC (premature ventricular contraction)   . Atrial flutter 02/03/2010    Consult by Dr. Ladona Ridgel... June, 2011.... plan rate control...  Coumadin not needed... ablation if rate not controlled well  . Dyslipidemia   . Hemorrhoids   . Renal calculus   . Degenerative joint disease   . Anxiety   . Basal cell carcinoma   . Ejection fraction     EF 50-55%, echo, June, 2011  . Chest pain     Nuclear, March, 2010, no ischemia    Past Surgical History  Procedure Date  . Kidney stone surgery     Patient Active Problem List  Diagnosis  . ANXIETY  . HEMORRHOIDS  . ALLERGIC RHINITIS  . DEGENERATIVE JOINT DISEASE  . BASAL CELL CARCINOMA, HX OF  . RENAL CALCULUS, HX OF  . URI (upper respiratory infection)  . HTN (hypertension)  . Mitral regurgitation  . LVH (left ventricular hypertrophy)  . Diastolic dysfunction  . Sinus bradycardia  . PVC (premature ventricular contraction)  . Dyslipidemia  . Atrial flutter  . Incomplete RBBB  . Chest pain  . Ejection fraction    ROS   Patient denies fever, chills, headache, rash, sweats, change in vision, change in hearing, chest pain, cough, nausea vomiting, urinary symptoms. All other systems are reviewed and are negative.  PHYSICAL EXAM  Patient is oriented to person time and place. Affect is normal. He is here with his wife. There is no jugular venous distention. Lungs are clear. Respiratory effort is nonlabored. Cardiac exam reveals S1 and S2. There are no clicks or significant murmurs. The abdomen is soft. Is no peripheral edema.  Filed Vitals:   07/09/12 1343    BP: 112/60  Pulse: 48  Resp: 16  Height: 6\' 2"  (1.88 m)  Weight: 173 lb 12 oz (78.812 kg)   EKG is done today. I have reviewed it. There is marked sinus bradycardia. He has had this type of bradycardia in the past. There is no change.  ASSESSMENT & PLAN

## 2012-07-09 NOTE — Assessment & Plan Note (Signed)
The patient has had no recurrent chest pain. No further workup.

## 2012-07-09 NOTE — Assessment & Plan Note (Signed)
The patient continues to do very well. He has not had any significant episodes of atrial flutter. No change in therapy. He does not yet meet criteria for the question of anticoagulation.

## 2012-07-09 NOTE — Assessment & Plan Note (Signed)
Blood pressure is nicely controlled. No change in therapy. 

## 2012-12-24 ENCOUNTER — Other Ambulatory Visit: Payer: Self-pay | Admitting: Pulmonary Disease

## 2012-12-24 MED ORDER — FELODIPINE ER 10 MG PO TB24: 10.0000 mg | ORAL_TABLET | Freq: Every day | ORAL | Status: DC

## 2013-01-13 ENCOUNTER — Ambulatory Visit (INDEPENDENT_AMBULATORY_CARE_PROVIDER_SITE_OTHER)
Admission: RE | Admit: 2013-01-13 | Discharge: 2013-01-13 | Disposition: A | Discharge status: Home / Self Care | Payer: Medicare Other | Source: Ambulatory Visit | Attending: Pulmonary Disease | Admitting: Pulmonary Disease

## 2013-01-13 ENCOUNTER — Ambulatory Visit (INDEPENDENT_AMBULATORY_CARE_PROVIDER_SITE_OTHER): Payer: Medicare Other | Admitting: Pulmonary Disease

## 2013-01-13 ENCOUNTER — Other Ambulatory Visit (INDEPENDENT_AMBULATORY_CARE_PROVIDER_SITE_OTHER): Payer: Medicare Other

## 2013-01-13 ENCOUNTER — Encounter: Payer: Self-pay | Admitting: Pulmonary Disease

## 2013-01-13 VITALS — BP 118/58 | HR 48 | Temp 96.7°F | Ht 72.0 in | Wt 182.4 lb

## 2013-01-13 DIAGNOSIS — Z860100 Personal history of colon polyps, unspecified: Secondary | ICD-10-CM

## 2013-01-13 DIAGNOSIS — I4510 Unspecified right bundle-branch block: Secondary | ICD-10-CM

## 2013-01-13 DIAGNOSIS — E785 Hyperlipidemia, unspecified: Secondary | ICD-10-CM

## 2013-01-13 DIAGNOSIS — M1990 Unspecified osteoarthritis, unspecified site: Secondary | ICD-10-CM

## 2013-01-13 DIAGNOSIS — I4892 Unspecified atrial flutter: Secondary | ICD-10-CM

## 2013-01-13 DIAGNOSIS — K649 Unspecified hemorrhoids: Secondary | ICD-10-CM

## 2013-01-13 DIAGNOSIS — I059 Rheumatic mitral valve disease, unspecified: Secondary | ICD-10-CM

## 2013-01-13 DIAGNOSIS — I10 Essential (primary) hypertension: Secondary | ICD-10-CM

## 2013-01-13 DIAGNOSIS — F411 Generalized anxiety disorder: Secondary | ICD-10-CM

## 2013-01-13 DIAGNOSIS — Z85828 Personal history of other malignant neoplasm of skin: Secondary | ICD-10-CM

## 2013-01-13 DIAGNOSIS — N320 Bladder-neck obstruction: Secondary | ICD-10-CM

## 2013-01-13 DIAGNOSIS — I340 Nonrheumatic mitral (valve) insufficiency: Secondary | ICD-10-CM

## 2013-01-13 DIAGNOSIS — Z87442 Personal history of urinary calculi: Secondary | ICD-10-CM

## 2013-01-13 LAB — BASIC METABOLIC PANEL
CO2: 33 mEq/L — ABNORMAL HIGH (ref 19–32)
Calcium: 9.6 mg/dL (ref 8.4–10.5)
GFR: 89.15 mL/min (ref >60.00)
Potassium: 3.7 mEq/L (ref 3.5–5.1)
Sodium: 141 mEq/L (ref 135–145)

## 2013-01-13 LAB — HEPATIC FUNCTION PANEL
AST: 23 U/L (ref 0–37)
Alkaline Phosphatase: 60 U/L (ref 39–117)
Bilirubin, Direct: 0.3 mg/dL (ref 0.0–0.3)

## 2013-01-13 LAB — LIPID PANEL
HDL: 52 mg/dL (ref >39.00)
Total CHOL/HDL Ratio: 2
VLDL: 8.4 mg/dL (ref 0.0–40.0)

## 2013-01-13 LAB — CBC WITH DIFFERENTIAL/PLATELET
Basophils Absolute: 0 10*3/uL (ref 0.0–0.1)
Eosinophils Absolute: 0.1 10*3/uL (ref 0.0–0.7)
HCT: 48.8 % (ref 39.0–52.0)
Hemoglobin: 16.6 g/dL (ref 13.0–17.0)
Lymphs Abs: 2.2 10*3/uL (ref 0.7–4.0)
MCHC: 34.1 g/dL (ref 30.0–36.0)
Monocytes Absolute: 0.5 10*3/uL (ref 0.1–1.0)
Neutro Abs: 3.5 10*3/uL (ref 1.4–7.7)
Platelets: 130 10*3/uL — ABNORMAL LOW (ref 150.0–400.0)
RDW: 13.8 % (ref 11.5–14.6)

## 2013-01-13 MED ORDER — FELODIPINE ER 10 MG PO TB24: 10.0000 mg | ORAL_TABLET | Freq: Every day | ORAL | Status: DC

## 2013-01-13 MED ORDER — LOSARTAN POTASSIUM-HCTZ 100-12.5 MG PO TABS: 1.0000 | ORAL_TABLET | Freq: Every day | ORAL | Status: DC

## 2013-01-13 MED ORDER — SIMVASTATIN 40 MG PO TABS: 40.0000 mg | ORAL_TABLET | Freq: Every day | ORAL | Status: DC

## 2013-01-13 MED ORDER — ATENOLOL 50 MG PO TABS: ORAL_TABLET | ORAL | Status: DC

## 2013-01-13 NOTE — Patient Instructions (Addendum)
Today we updated your med list in our EPIC system...    Continue your current medications the same...    We refilled your meds per request...  Today we did your follow up CXR & FASTING blood work...    We will contact you w/ the results when available...   Call for any questions...  Let's plan a follow up visit in 22mo, sooner if needed for problems.Marland KitchenMarland Kitchen

## 2013-02-20 ENCOUNTER — Other Ambulatory Visit: Payer: Self-pay | Admitting: Pulmonary Disease

## 2013-02-20 MED ORDER — LOSARTAN POTASSIUM-HCTZ 100-12.5 MG PO TABS: 1.0000 | ORAL_TABLET | Freq: Every day | ORAL | Status: DC

## 2013-02-20 NOTE — Telephone Encounter (Signed)
Received a fax request for refill on losartan hctz. According to epic rx was printed on 01-13-13 for #[id number] refills but I called the pharmacy and they do not have this on file so rx sent again. Carron Curie, CMA

## 2013-03-31 ENCOUNTER — Emergency Department (HOSPITAL_COMMUNITY): Payer: Medicare Other

## 2013-03-31 ENCOUNTER — Emergency Department (HOSPITAL_COMMUNITY)
Admission: EM | Admit: 2013-03-31 | Discharge: 2013-03-31 | Disposition: A | Discharge status: Home / Self Care | Payer: Medicare Other | Attending: Emergency Medicine | Admitting: Emergency Medicine

## 2013-03-31 ENCOUNTER — Encounter (HOSPITAL_COMMUNITY): Payer: Self-pay | Admitting: Emergency Medicine

## 2013-03-31 DIAGNOSIS — Z8679 Personal history of other diseases of the circulatory system: Secondary | ICD-10-CM | POA: Insufficient documentation

## 2013-03-31 DIAGNOSIS — I10 Essential (primary) hypertension: Secondary | ICD-10-CM | POA: Insufficient documentation

## 2013-03-31 DIAGNOSIS — Z7982 Long term (current) use of aspirin: Secondary | ICD-10-CM | POA: Insufficient documentation

## 2013-03-31 DIAGNOSIS — R110 Nausea: Secondary | ICD-10-CM | POA: Insufficient documentation

## 2013-03-31 DIAGNOSIS — Z79899 Other long term (current) drug therapy: Secondary | ICD-10-CM | POA: Insufficient documentation

## 2013-03-31 DIAGNOSIS — N23 Unspecified renal colic: Secondary | ICD-10-CM | POA: Insufficient documentation

## 2013-03-31 DIAGNOSIS — Z8659 Personal history of other mental and behavioral disorders: Secondary | ICD-10-CM | POA: Insufficient documentation

## 2013-03-31 DIAGNOSIS — M1990 Unspecified osteoarthritis, unspecified site: Secondary | ICD-10-CM | POA: Insufficient documentation

## 2013-03-31 DIAGNOSIS — Z9889 Other specified postprocedural states: Secondary | ICD-10-CM | POA: Insufficient documentation

## 2013-03-31 DIAGNOSIS — E785 Hyperlipidemia, unspecified: Secondary | ICD-10-CM | POA: Insufficient documentation

## 2013-03-31 DIAGNOSIS — Z85828 Personal history of other malignant neoplasm of skin: Secondary | ICD-10-CM | POA: Insufficient documentation

## 2013-03-31 LAB — URINE MICROSCOPIC-ADD ON

## 2013-03-31 LAB — URINALYSIS, ROUTINE W REFLEX MICROSCOPIC
Bilirubin Urine: NEGATIVE
Glucose, UA: NEGATIVE mg/dL
Ketones, ur: NEGATIVE mg/dL
Leukocytes, UA: NEGATIVE
Nitrite: NEGATIVE
Protein, ur: NEGATIVE mg/dL
pH: 7.5 (ref 5.0–8.0)

## 2013-03-31 MED ORDER — NAPROXEN 500 MG PO TABS
500.0000 mg | ORAL_TABLET | Freq: Once | ORAL | Status: AC
  Administered 2013-03-31: 500 mg via ORAL
  Filled 2013-03-31: qty 1

## 2013-03-31 MED ORDER — TAMSULOSIN HCL 0.4 MG PO CAPS: ORAL_CAPSULE | ORAL | Status: DC

## 2013-03-31 MED ORDER — TAMSULOSIN HCL 0.4 MG PO CAPS
0.4000 mg | ORAL_CAPSULE | ORAL | Status: AC
  Administered 2013-03-31: 0.4 mg via ORAL
  Filled 2013-03-31: qty 1

## 2013-03-31 MED ORDER — SODIUM CHLORIDE 0.9 % IV SOLN
INTRAVENOUS | Status: DC
  Administered 2013-03-31: 06:00:00 via INTRAVENOUS

## 2013-03-31 MED ORDER — FENTANYL CITRATE 0.05 MG/ML IJ SOLN
100.0000 ug | Freq: Once | INTRAMUSCULAR | Status: AC
  Administered 2013-03-31: 100 ug via INTRAVENOUS
  Filled 2013-03-31: qty 2

## 2013-03-31 MED ORDER — NAPROXEN SODIUM 220 MG PO TABS: ORAL_TABLET | ORAL | Status: DC

## 2013-03-31 MED ORDER — OXYCODONE-ACETAMINOPHEN 5-325 MG PO TABS: 1.0000 | ORAL_TABLET | Freq: Four times a day (QID) | ORAL | Status: DC | PRN

## 2013-03-31 MED ORDER — ONDANSETRON HCL 4 MG/2ML IJ SOLN
4.0000 mg | Freq: Once | INTRAMUSCULAR | Status: AC
  Administered 2013-03-31: 4 mg via INTRAVENOUS
  Filled 2013-03-31: qty 2

## 2013-03-31 NOTE — ED Provider Notes (Cosign Needed)
CSN: [id number]     Arrival date & time 03/31/13  0444 History     First MD Initiated Contact with Patient 03/31/13 0540     Chief Complaint  Patient presents with  . Flank Pain   (Consider location/radiation/quality/duration/timing/severity/associated sxs/prior Treatment) HPI This is a 71 year old male with a remote history of kidney stone. He is here with left lower quadrant pain that started about 1 AM this morning. The pain was mild at first but became severe. It was associated with nausea but no vomiting. He denies hematuria, dysuria or difficulty urinating. He characterizes the pain as like that his prior kidney stones. He was given Zofran and fentanyl IV prior to my examination with significant improvement in his pain.   Past Medical History  Diagnosis Date  . Allergic rhinitis   . HTN (hypertension)   . Aortic insufficiency     mild... echo... 02/2010  . Mitral regurgitation     mild... echo.. 02/2010  . LVH (left ventricular hypertrophy)     moderate... echo... 02/2010  . Diastolic dysfunction     EF 50-55%... echo.. 02/2010 (55-60% echo 2009)  . Incomplete RBBB     IRBBB; sinus bradycardia w/ PVCs  . Sinus bradycardia   . PVC (premature ventricular contraction)   . Atrial flutter 02/03/2010    Consult by Dr. Ladona Ridgel... June, 2011.... plan rate control...  Coumadin not needed... ablation if rate not controlled well  . Dyslipidemia   . Hemorrhoids   . Renal calculus   . Degenerative joint disease   . Anxiety   . Basal cell carcinoma   . Ejection fraction     EF 50-55%, echo, June, 2011  . Chest pain     Nuclear, March, 2010, no ischemia   Past Surgical History  Procedure Laterality Date  . Kidney stone surgery     Family History  Problem Relation Age of Onset  . Hypertension Mother    History  Substance Use Topics  . Smoking status: Never Smoker   . Smokeless tobacco: Never Used  . Alcohol Use: 1.8 oz/week    3 Glasses of wine per week     Comment: 3 glasses  of wine per week    Review of Systems  All other systems reviewed and are negative.    Allergies  Review of patient's allergies indicates no known allergies.  Home Medications   Current Outpatient Rx  Name  Route  Sig  Dispense  Refill  . amoxicillin (AMOXIL) 500 MG capsule      4 tabs 1 hr prior to dental procedures.          Marland Kitchen aspirin 81 MG EC tablet   Oral   Take 81 mg by mouth daily.           Marland Kitchen atenolol (TENORMIN) 50 MG tablet   Oral   Take 50 mg by mouth daily.         . felodipine (PLENDIL) 10 MG 24 hr tablet   Oral   Take 1 tablet (10 mg total) by mouth daily.   90 tablet   3   . fish oil-omega-3 fatty acids 1000 MG capsule   Oral   Take 1 g by mouth daily.           Marland Kitchen losartan-hydrochlorothiazide (HYZAAR) 100-12.5 MG per tablet   Oral   Take 1 tablet by mouth daily.   90 tablet   3   . Multiple Vitamin (MULTIVITAMIN) tablet   Oral  Take 1 tablet by mouth daily.           . simvastatin (ZOCOR) 40 MG tablet   Oral   Take 1 tablet (40 mg total) by mouth at bedtime.   90 tablet   3    BP 159/76  Pulse 77  Temp(Src) 97.7 F (36.5 C) (Oral)  Resp 18  SpO2 100%  Physical Exam General: Well-developed, well-nourished male in no acute distress; appearance consistent with age of record HENT: normocephalic, atraumatic Eyes: pupils equal round and reactive to light; extraocular muscles intact Neck: supple Heart: regular rate and rhythm; no murmurs, rubs or gallops Lungs: clear to auscultation bilaterally Abdomen: soft; nondistended; nontender; no masses or hepatosplenomegaly; bowel sounds present Extremities: No deformity; full range of motion; pulses normal Neurologic: Awake, alert and oriented; motor function intact in all extremities and symmetric; no facial droop Skin: Warm and dry Psychiatric: Normal mood and affect    ED Course   Procedures (including critical care time)    MDM   Nursing notes and vitals signs, including  pulse oximetry, reviewed.  Summary of this visit's results, reviewed by myself:  Labs:  Results for orders placed during the hospital encounter of 03/31/13 (from the past 24 hour(s))  URINALYSIS, ROUTINE W REFLEX MICROSCOPIC     Status: Abnormal   Collection Time    03/31/13  5:23 AM      Result Value Range   Color, Urine YELLOW  YELLOW   APPearance TURBID (*) CLEAR   Specific Gravity, Urine 1.017  1.005 - 1.030   pH 7.5  5.0 - 8.0   Glucose, UA NEGATIVE  NEGATIVE mg/dL   Hgb urine dipstick MODERATE (*) NEGATIVE   Bilirubin Urine NEGATIVE  NEGATIVE   Ketones, ur NEGATIVE  NEGATIVE mg/dL   Protein, ur NEGATIVE  NEGATIVE mg/dL   Urobilinogen, UA 0.2  0.0 - 1.0 mg/dL   Nitrite NEGATIVE  NEGATIVE   Leukocytes, UA NEGATIVE  NEGATIVE  URINE MICROSCOPIC-ADD ON     Status: Abnormal   Collection Time    03/31/13  5:23 AM      Result Value Range   Squamous Epithelial / LPF RARE  RARE   RBC / HPF 3-6  <3 RBC/hpf   Bacteria, UA MANY (*) RARE    Imaging Studies: Ct Abdomen Pelvis Wo Contrast  03/31/2013   *RADIOLOGY REPORT*  Clinical Data: Left flank pain and nausea.  History of kidney stones.  CT ABDOMEN AND PELVIS WITHOUT CONTRAST  Technique:  Multidetector CT imaging of the abdomen and pelvis was performed following the standard protocol without intravenous contrast.  Comparison: None.  Findings: Lung Bases: Dependent atelectasis.  Linear scarring in the lingula.  Liver:  Unenhanced CT was performed per clinician order.  Lack of IV contrast limits sensitivity and specificity, especially for evaluation of abdominal/pelvic solid viscera.  Grossly normal.  Spleen:  Normal.  Gallbladder:  Partially contracted.  No calcified stones.  Common bile duct:  Normal.  Pancreas:  Grossly normal.  Adrenal glands:  Normal bilaterally.  Kidneys:  Enlargement of the left kidney with marked perinephric stranding.  Moderate left hydroureteronephrosis extends into the anatomic pelvis.  There is a 4 mm calculus in  the left UVJ.  The right ureter appears within normal limits.  No right renal calculi are identified.  No residual left collecting system calculi are present.  The left ureter is markedly tortuous.  Stomach:  Small hiatal hernia.  No inflammatory changes or mass lesion.  Small  bowel:  Scattered areas of fecalization of small bowel without obstruction, likely secondary to stasis.  Colon:   Normal appendix.  Large right-sided stool burden. Thickening of the descending colon is probably due to under distention rather than colitis.  Pelvic Genitourinary:  Prostatomegaly with 5.7 cm transverse prostate span.  The urinary bladder appears normal aside from left UVJ stone.  No free fluid in the anatomic pelvis.  Bones:  Osteopenia.  Lumbar degenerative disease.  No aggressive osseous lesions.  Vasculature: Mild atherosclerosis without acute vascular abnormality.  Body Wall: Normal.  IMPRESSION: 4 mm left UVJ stone with moderate left hydroureteronephrosis.   Original Report Authenticated By: Andreas Newport, M.D.    6:25 AM Patient's pain continues to be well-controlled. Will refer him to urology should he not pass the stone.  Hanley Seamen, MD 03/31/13 1610  Hanley Seamen, MD 04/02/13 2251

## 2013-03-31 NOTE — ED Notes (Signed)
Pt is c/o left flank pain that started about 1am this morning  Pt has hx of kidney stones  Pt is c/o nausea without vomting

## 2013-04-04 NOTE — Progress Notes (Signed)
Subjective:    Patient ID: Carlos [name], male    DOB: Sep 20, 1940, 72 y.o.   MRN: [id number]  HPI 72 y/o WM here for a follow up visit... he has multiple medical problems as noted below...    ~  July 04, 2011:  79mo ROV & his CC is allergy flair w/ the Autumn leaves etc (OTC Rx); no new complaints or concerns- stable on current meds; BP controlled (he went back to 50mg  Aten daily)- denies CP, palpit, SOB, edema, etc; he saw [facility] 10/12 for f/u PAFlutter- he has sinus brady, doing well & asymptomatic, f/u planned 29yr; he sees DrHoover for Derm- total body exam done 8/12 & several AKs removed;  OK flu shot today... 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    HBP> on Aten50, Plendil10, Hyzaar100-12.5;  BP= 112/82 & denies CP, palpit, dizzy, SOB, edema, etc...    Valv Heart Dis> on Amoxicillin Prn for SBE prophy...    AFlutter, RBBB> on ASA81; denies CP, palpit, dizzy, etc; he had f/u [name] 5/13- doing well & no need for pacer...    Chol> on Simva40, FishOil; on diet & wt stable- FLP 5/13 showed TChol 112, TG 39, HDL 55, LDL 49    GI- polyps, hems> he remains asymptomatic w/o abd pain, n/v, c/d, blood seen...    Kidney stones> he remains asymptomatic w/o pain, dysuria, blood, etc...    DJD> on OTC analgesics as needed; he continues to work hard, min arthritic symptoms, using OTC meds as needed...    Anxiety> doing satis, not on meds...  Skin cancer> he continues to f/u w/ Derm Q17mo... We reviewed prob list, meds, xrays and labs> see below for updates >> ok Flu shot today...  ~  Jan 13, 2013:  79mo ROV & Carlos [name] has had a good interval w/o new complaints or concerns... 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    HBP> on Aten50, Plendil10, Hyzaar100-12.5;  BP= 118/58 & denies CP, palpit, dizzy, SOB, edema, etc...    Valv Heart Dis> on Amoxicillin Prn for SBE prophy; he had f/u 11/13 [name]- PAFlutter, mild AI&MR, doing satis/ no change-same meds...    AFlutter, RBBB> on ASA81; denies CP, palpit, dizzy, etc; he had f/u [name] 5/13- doing well & no need for pacer...    Chol> on Simva40, FishOil; on diet & wt stable- FLP 7/14 showed TChol 113, TG 42, HDL 52, LDL 53    GI- polyps, hems> he remains asymptomatic w/o abd pain, n/v, c/d, blood seen; last colon 12/10 w/ divertics, 3mm adenoma, & f/u 68yrs...    Kidney stones> he remains asymptomatic w/o pain, dysuria, blood, etc...    DJD> on OTC analgesics as needed;  he continues to work hard, min arthritic symptoms, using OTC meds as needed...    Anxiety> doing satis, not on meds...    Skin cancer> he continues to f/u w/ Derm Q63mo... We reviewed prob list, meds, xrays and labs> see below for updates >>  CXR 5/14 showed heart at upper lim of norm, mild atherosclerotic changes in Ao, bilat nipple shadows, NAD... LABS 5/14:  FLP- at goals on simva40;  Chems- wnl;  CBC- wnl x plat=130K;  TSH=1.04;  PSA=1.70...          Problem List:  HYPERTENSION (ICD-401.9) - controlled on ATENOLOL 50mg /d,  FELODIPINE 10mg /d, & LOSARTAN/HCT 100-12.5 daily... ~  see 2DEcho below> ~  CXR 4/11 showed borderline cardiomeg, clear lungs, DJD spine, NAD;  CXR 4/12 same> NAD, WNL... ~  10/11:   BP= 110/66 & similar at home; pt denies HA, fatigue, visual changes, CP, palipit, dizziness, syncope, dyspnea, edema, etc... ~  4/12:   BP= 110/84 & he remains asymptomatic... We decided to decr the ATENOLOL from 50mg  to 25mg /d due to his bradycardia... ~  CXR 4/12 showed normal heart size, clear lungs, NAD... ~  10/12:  BP= 120/72, Pulse= 50 & regular; he went back on his Aten50/d; he remains asymptomatic w/o CP, palpit, dizzy, syncope, SOB, edema, etc... ~  5/13:  BP= 122/80 & he remains bradycardic but doesn't want to cut the BBlocker; denies CP, palpit, dizzy, syncope, SOB, etc. ~  11/13: on Aten50, Plendil10, Hyzaar100-12.5;  BP= 112/82 & denies CP, palpit, dizzy, SOB, edema, etc... ~  5/14: on Aten50, Plendil10, Hyzaar100-12.5;  BP= 118/58 & denies CP, palpit, dizzy, SOB, edema, etc  VALVULAR HEART DISEASE (ICD-424.90) - followed by [name]/Taylor- pt knows to take AMOX prior to dental work...  ~  2DEcho in 2002 w/ mild AI, MR, TR and normal LVF... ~  2DEcho 2/09 showed LV sl dilated w/ mild DD, norm LVF w/o regional wall motion abn & EF= 55-60%;  mild AI, mild-mod MR, dil LA @ 52mm... ~  NuclearStressTest 3/10 showed mild inferobas thinning, no ischemia, EF=48% w/ mild global HK... ~  2DEcho 4/10 showed mild AI & MR, mod dil LA, norm LVF w/ EF= 55-60% (Myoview= false result). ~  2DEcho 6/11 showed mod conc LVH, norm wall motion & EF= 50-55%, mild AI, mild thick MV leaflets w/ mild MR, mod LAdil= 42mm. ~  10/12 & 11/13: he saw [facility] for Cards f/u> doing satis, not on anticoagulation, no changes made...  ATRIAL FLUTTER (ICD-427.32) & BUNDLE BRANCH BLOCK, RIGHT (ICD-426.4) - on ASA 325mg /d and rate control strategy...  ~  prev EKG's w/ IRBBB...2/09 w/ IVCD, NSR, & QS wave in III and aVF (new)... check LVF (nl) and wall motion (nl) on echo... ~  he developed AFlutter 2011 & had evals from [name] 7/11 & [name] 10/11-  they rec rate control & ASA, w/ consideration of cath ablation if not controlled... ~  2DEcho 6/11 showed mod conc LVH, norm wall motion & EF= 50-55%, mild AI, mild thick MV leaflets w/ mild MR, mod LAdil= 42mm;    ~  Holter 6/11 revealed AFlutter (ave rate 106, max=145, no pauses)... ~  5/13: he saw [name]- doing well, no symptoms, no indication for pacer...  ~  EKG 11/13 showed sinus brady, rate48, rsr', otherw wnl...  HYPERCHOLESTEROLEMIA (ICD-272.0) - on SIMVASTATIN 40mg /d, Fish Oil, & diet...  ~  FLP 1/08 showed TChol 106, TG 59, HDL 48, LDL46 ~  FLP 2/09 showed TChol 123, TG 57, HDL  51, LDL 61 ~  FLP 2/10 showed TChol 117, TG 47, HDL 55, LDL 53 ~  FLP 4/11 showed TChol 105, TG 27, HDL 57, LDL 43 ~  FLP 4/12 on Simva40 showed TChol 104, Tg 34, HDL 51, LDL 46 ~  FLP 5/13 on Simva40 showed TChol 112, TG 39, HDL 55, LDL 49 ~  FLP 7/14 on Simva40 showed TChol 113, TG 42, HDL 52, LDL 53  Hx of HEMORRHOIDS (ICD-455.6) - last colonoscopy in [place] 3/05 by DrNichols was neg x hems... rec f/u 5 yrs due to fam history & he asks to be seen here (refer to GI)... ~  12/10: had f/u colonoscopy by DrStark- 3mm sessile polyp in cecum= path showed fragments of tubular adenoma; +mild divertics...   RENAL CALCULUS, HX OF (ICD-V13.01) - surg in 1974 for kid stone... no recurrence...  DEGENERATIVE JOINT DISEASE (ICD-715.90) - occas neck discomfort without arm pain, numbness, etc... he is very active...  ANXIETY (ICD-300.00)  BASAL CELL CARCINOMA, HX OF (ICD-V10.83) - he follow up w/ Derm in [place] every 3-21months. ~  8/12: note from DrWHoover- full body check & several AKs removed... ~  He continues to f/u w/ Derm every 12mo he says...  Health Maintenance: ~  GI:  DrStark w/ colon 12/10 as above... ~  GU:  4/11> DRE= neg & PSA= 1.50 ~  Immunizations:  he gets yearly Flu shots,  ?Pneumovax,  Given TDAP 5/13...   Past Surgical History  Procedure Laterality Date  . Kidney stone surgery      Outpatient Encounter Prescriptions as of 01/13/2013  Medication Sig Dispense Refill  . amoxicillin (AMOXIL) 500 MG capsule 4 tabs 1 hr prior to dental procedures.       Marland Kitchen aspirin 81 MG EC tablet Take 81 mg by mouth  daily.        . felodipine (PLENDIL) 10 MG 24 hr tablet Take 1 tablet (10 mg total) by mouth daily.  90 tablet  3  . fish oil-omega-3 fatty acids 1000 MG capsule Take 1 g by mouth daily.        . Multiple Vitamin (MULTIVITAMIN) tablet Take 1 tablet by mouth daily.        . simvastatin (ZOCOR) 40 MG tablet Take 1 tablet (40 mg total) by mouth at bedtime.  90 tablet  3  . [DISCONTINUED] atenolol (TENORMIN) 50 MG tablet Take as directed  90 tablet  3  . [DISCONTINUED] atenolol (TENORMIN) 50 MG tablet Take as directed  90 tablet  3  . [DISCONTINUED] felodipine (PLENDIL) 10 MG 24 hr tablet Take 1 tablet (10 mg total) by mouth daily.  90 tablet  3  . [DISCONTINUED] losartan-hydrochlorothiazide (HYZAAR) 100-12.5 MG per tablet Take 1 tablet by mouth daily.  90 tablet  3  . [DISCONTINUED] losartan-hydrochlorothiazide (HYZAAR) 100-12.5 MG per tablet Take 1 tablet by mouth daily.  90 tablet  3  . [DISCONTINUED] simvastatin (ZOCOR) 40 MG tablet Take 1 tablet (40 mg total) by mouth at bedtime.  90 tablet  3   No facility-administered encounter medications on file as of 01/13/2013.    No Known Allergies   Current Medications, Allergies, Past Medical History, Past Surgical History, Family History, and Social History were reviewed in [employer] record.    Review of Systems         See HPI - all other systems neg except as noted... The patient denies anorexia, fever, weight loss, weight gain, vision loss, decreased hearing, hoarseness, chest  pain, syncope, dyspnea on exertion, peripheral edema, prolonged cough, headaches, hemoptysis, abdominal pain, melena, hematochezia, severe indigestion/heartburn, hematuria, incontinence, muscle weakness, suspicious skin lesions, transient blindness, difficulty walking, depression, unusual weight change, abnormal bleeding, enlarged lymph nodes, and angioedema.    Objective:   Physical Exam     WD, WN, 72 y/o WM in NAD... GENERAL:  Alert &  oriented; pleasant & cooperative... HEENT:  [place]/AT, EOM-wnl, PERRLA, Fundi-benign, EACs-clear, TMs-wnl, NOSE-clear, THROAT-clear & wnl. NECK:  Supple w/ fairROM; no JVD; normal carotid impulses w/o bruits; no thyromegaly or nodules palpated; no lymphadenopathy. CHEST:  Clear to P & A; without wheezes/ rales/ or rhonchi. HEART:  Regular Rhythm;  gr1/6 SEM LSB without rubs or gallops heard... ABDOMEN:  Soft & nontender; normal bowel sounds; no organomegaly or masses detected. (RECTAL:  Neg - prostate 2+ & nontender w/o nodules; stool hematest neg) EXT: without deformities or arthritic changes; no varicose veins/ venous insuffic/ or edema. NEURO:  CN's intact; motor testing normal; sensory testing normal; gait normal & balance OK. DERM:  No lesions noted; no rash etc...  RADIOLOGY DATA:  Reviewed in the EPIC EMR & discussed w/ the patient...  LABORATORY DATA:  Reviewed in the EPIC EMR & discussed w/ the patient...    Assessment & Plan:    HBP>  Controlled on above; continue same...  Hx AFlutter, RBBB, Valv heart dis>  Followed by [name] & [name]; stable, continue same Rx...  CHOL>  Stable on diet + Simva40; continue same...  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  Other medical issues as noted...    Patient's Medications  New Prescriptions   NAPROXEN SODIUM (ALEVE) 220 MG TABLET    Take 1 tablet twice daily until stone passes.   OXYCODONE-ACETAMINOPHEN (PERCOCET) 5-325 MG PER TABLET    Take 1-2 tablets by mouth every 6 (six) hours as needed for pain.   TAMSULOSIN (FLOMAX) 0.4 MG CAPS    Take 1 capsule daily until stone passes.  Previous Medications   AMOXICILLIN (AMOXIL) 500 MG CAPSULE    4 tabs 1 hr prior to dental procedures.    ASPIRIN 81 MG EC TABLET    Take 81 mg by mouth daily.     ATENOLOL (TENORMIN) 50 MG TABLET    Take 50 mg by mouth daily.   FISH OIL-OMEGA-3 FATTY ACIDS 1000 MG CAPSULE    Take 1 g by mouth  daily.     MULTIPLE VITAMIN (MULTIVITAMIN) TABLET    Take 1 tablet by mouth daily.    Modified Medications   Modified Medication Previous Medication   FELODIPINE (PLENDIL) 10 MG 24 HR TABLET felodipine (PLENDIL) 10 MG 24 hr tablet      Take 1 tablet (10 mg total) by mouth daily.    Take 1 tablet (10 mg total) by mouth daily.   LOSARTAN-HYDROCHLOROTHIAZIDE (HYZAAR) 100-12.5 MG PER TABLET losartan-hydrochlorothiazide (HYZAAR) 100-12.5 MG per tablet      Take 1 tablet by mouth daily.    Take 1 tablet by mouth daily.   SIMVASTATIN (ZOCOR) 40 MG TABLET simvastatin (ZOCOR) 40 MG tablet      Take 1 tablet (40 mg total) by mouth at bedtime.    Take 1 tablet (40 mg total) by mouth at bedtime.  Discontinued Medications   ATENOLOL (TENORMIN) 50 MG TABLET    Take as directed   ATENOLOL (TENORMIN) 50 MG TABLET    Take as directed

## 2013-07-07 ENCOUNTER — Ambulatory Visit (INDEPENDENT_AMBULATORY_CARE_PROVIDER_SITE_OTHER): Payer: Medicare Other | Admitting: Cardiology

## 2013-07-07 ENCOUNTER — Encounter: Payer: Self-pay | Admitting: Cardiology

## 2013-07-07 VITALS — BP 120/68 | HR 47 | Ht 72.0 in | Wt 179.0 lb

## 2013-07-07 DIAGNOSIS — I4892 Unspecified atrial flutter: Secondary | ICD-10-CM

## 2013-07-07 DIAGNOSIS — I059 Rheumatic mitral valve disease, unspecified: Secondary | ICD-10-CM

## 2013-07-07 DIAGNOSIS — I10 Essential (primary) hypertension: Secondary | ICD-10-CM

## 2013-07-07 DIAGNOSIS — R001 Bradycardia, unspecified: Secondary | ICD-10-CM

## 2013-07-07 DIAGNOSIS — R079 Chest pain, unspecified: Secondary | ICD-10-CM

## 2013-07-07 DIAGNOSIS — I340 Nonrheumatic mitral (valve) insufficiency: Secondary | ICD-10-CM

## 2013-07-07 DIAGNOSIS — I498 Other specified cardiac arrhythmias: Secondary | ICD-10-CM

## 2013-07-07 NOTE — Assessment & Plan Note (Signed)
He has asymptomatic sinus bradycardia. He is on a beta blocker. However this helps with his palpitations. He has no symptoms. No change in therapy.

## 2013-07-07 NOTE — Assessment & Plan Note (Signed)
He has not had any recurrent flutter. When he turns 74 in 2 years, we will have to decide if anticoagulation is appropriate.

## 2013-07-07 NOTE — Assessment & Plan Note (Signed)
He had very mild mitral regurgitation by echo in 2011. We will consider followup echo next year.

## 2013-07-07 NOTE — Progress Notes (Signed)
HPI  Patient is seen today to followup history of sinus bradycardia and atrial flutter. His atrial flutter has been in frequent. He was seen by Dr. Ladona Ridgel in the decision was made to follow him medically. He does have sinus bradycardia and he is quite active with no problems. Over the past few years Dr. Ladona Ridgel and I have alternated seeing the patient every 6 months. I will now see the patient once yearly and Dr. Ladona Ridgel will be involved as needed. Patient is not having any significant symptoms. Up to this point he has not met criteria for anticoagulation.  No Known Allergies  Current Outpatient Prescriptions  Medication Sig Dispense Refill  . amoxicillin (AMOXIL) 500 MG capsule 4 tabs 1 hr prior to dental procedures.       Marland Kitchen aspirin 81 MG EC tablet Take 81 mg by mouth daily.        Marland Kitchen atenolol (TENORMIN) 50 MG tablet Take 50 mg by mouth daily.      . felodipine (PLENDIL) 10 MG 24 hr tablet Take 1 tablet (10 mg total) by mouth daily.  90 tablet  3  . fish oil-omega-3 fatty acids 1000 MG capsule Take 1 g by mouth daily.        Marland Kitchen losartan-hydrochlorothiazide (HYZAAR) 100-12.5 MG per tablet Take 1 tablet by mouth daily.  90 tablet  3  . Multiple Vitamin (MULTIVITAMIN) tablet Take 1 tablet by mouth daily.        . simvastatin (ZOCOR) 40 MG tablet Take 1 tablet (40 mg total) by mouth at bedtime.  90 tablet  3   No current facility-administered medications for this visit.    History   Social History  . Marital Status: Married    Spouse Name: Cora Stetson    Number of Children: 2  . Years of Education: N/A   Occupational History  . Not on file.   Social History Main Topics  . Smoking status: Never Smoker   . Smokeless tobacco: Never Used  . Alcohol Use: 1.8 oz/week    3 Glasses of wine per week     Comment: 3 glasses of wine per week  . Drug Use: No  . Sexual Activity: Not on file   Other Topics Concern  . Not on file   Social History Narrative  . No narrative on file     Family History  Problem Relation Age of Onset  . Hypertension Mother     Past Medical History  Diagnosis Date  . Allergic rhinitis   . HTN (hypertension)   . Aortic insufficiency     mild... echo... 02/2010  . Mitral regurgitation     mild... echo.. 02/2010  . LVH (left ventricular hypertrophy)     moderate... echo... 02/2010  . Diastolic dysfunction     EF 50-55%... echo.. 02/2010 (55-60% echo 2009)  . Incomplete RBBB     IRBBB; sinus bradycardia w/ PVCs  . Sinus bradycardia   . PVC (premature ventricular contraction)   . Atrial flutter 02/03/2010    Consult by Dr. Ladona Ridgel... June, 2011.... plan rate control...  Coumadin not needed... ablation if rate not controlled well  . Dyslipidemia   . Hemorrhoids   . Renal calculus   . Degenerative joint disease   . Anxiety   . Basal cell carcinoma   . Ejection fraction     EF 50-55%, echo, June, 2011  . Chest pain     Nuclear, March, 2010, no ischemia  Past Surgical History  Procedure Laterality Date  . Kidney stone surgery      Patient Active Problem List   Diagnosis Date Noted  . Chest pain     Priority: High  . Ejection fraction     Priority: High  . Diastolic dysfunction     Priority: High  . Dyslipidemia     Priority: High  . Hx of colonic polyps 01/13/2013  . Incomplete RBBB   . HTN (hypertension)   . Mitral regurgitation   . LVH (left ventricular hypertrophy)   . Sinus bradycardia   . PVC (premature ventricular contraction)   . Atrial flutter 02/03/2010  . ALLERGIC RHINITIS 12/28/2009  . BASAL CELL CARCINOMA, HX OF 10/26/2008  . ANXIETY 10/21/2007  . HEMORRHOIDS 10/21/2007  . DEGENERATIVE JOINT DISEASE 10/21/2007  . RENAL CALCULUS, HX OF 10/20/2007    ROS   Patient denies fever, chills, headache, sweats, rash, change in vision, change in hearing, chest pain, cough, nausea vomiting, urinary symptoms. All other systems are reviewed and are negative.  PHYSICAL EXAM  Patient is here with his wife. He  is oriented to person time and place. Affect is normal. There is no jugulovenous distention. Lungs are clear. Respiratory effort is nonlabored. Cardiac exam reveals S1 and S2. There is no clicks or significant murmurs. The abdomen is soft. There is no peripheral edema.  Filed Vitals:   07/07/13 1239  BP: 120/68  Pulse: 47  Height: 6' (1.829 m)  Weight: 179 lb (81.194 kg)   EKG is done today and reviewed by me. There is sinus bradycardia.  ASSESSMENT & PLAN

## 2013-07-07 NOTE — Assessment & Plan Note (Signed)
Blood pressure is well controlled. No change in therapy. 

## 2013-07-07 NOTE — Assessment & Plan Note (Signed)
The patient has not had any recurrent chest discomfort. No change in therapy.

## 2013-07-07 NOTE — Patient Instructions (Signed)
**Note De-identified  Obfuscation** Your physician recommends that you continue on your current medications as directed. Please refer to the Current Medication list given to you today.  Your physician wants you to follow-up in: 1 year. You will receive a reminder letter in the mail two months in advance. If you don't receive a letter, please call our office to schedule the follow-up appointment.  

## 2013-07-15 ENCOUNTER — Ambulatory Visit (INDEPENDENT_AMBULATORY_CARE_PROVIDER_SITE_OTHER): Payer: Medicare Other | Admitting: Pulmonary Disease

## 2013-07-15 ENCOUNTER — Encounter: Payer: Self-pay | Admitting: Pulmonary Disease

## 2013-07-15 VITALS — BP 110/66 | HR 47 | Temp 97.4°F | Ht 72.0 in | Wt 181.2 lb

## 2013-07-15 DIAGNOSIS — Z86010 Personal history of colonic polyps: Secondary | ICD-10-CM

## 2013-07-15 DIAGNOSIS — I10 Essential (primary) hypertension: Secondary | ICD-10-CM

## 2013-07-15 DIAGNOSIS — Z85828 Personal history of other malignant neoplasm of skin: Secondary | ICD-10-CM

## 2013-07-15 DIAGNOSIS — I059 Rheumatic mitral valve disease, unspecified: Secondary | ICD-10-CM

## 2013-07-15 DIAGNOSIS — M1990 Unspecified osteoarthritis, unspecified site: Secondary | ICD-10-CM

## 2013-07-15 DIAGNOSIS — Z87442 Personal history of urinary calculi: Secondary | ICD-10-CM

## 2013-07-15 DIAGNOSIS — I4510 Unspecified right bundle-branch block: Secondary | ICD-10-CM

## 2013-07-15 DIAGNOSIS — F411 Generalized anxiety disorder: Secondary | ICD-10-CM

## 2013-07-15 DIAGNOSIS — I4892 Unspecified atrial flutter: Secondary | ICD-10-CM

## 2013-07-15 DIAGNOSIS — I340 Nonrheumatic mitral (valve) insufficiency: Secondary | ICD-10-CM

## 2013-07-15 DIAGNOSIS — E785 Hyperlipidemia, unspecified: Secondary | ICD-10-CM

## 2013-07-15 NOTE — Progress Notes (Signed)
Subjective:    Patient ID: Carlos [name], male    DOB: 1941/01/03, 72 y.o.   MRN: [id number]  HPI 72 y/o WM here for a follow up visit... he has multiple medical problems as noted below...    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    HBP> on Aten50, Plendil10, Hyzaar100-12.5;  BP= 112/82 & denies CP, palpit, dizzy, SOB, edema, etc...    Valv Heart Dis> on Amoxicillin Prn for SBE prophy...    AFlutter, RBBB> on ASA81; denies CP, palpit, dizzy, etc; he had f/u [name] 5/13- doing well & no need for pacer...    Chol> on Simva40, FishOil; on diet & wt stable- FLP 5/13 showed TChol 112, TG 39, HDL 55, LDL 49    GI- polyps, hems> he remains asymptomatic w/o abd pain, n/v, c/d, blood seen...    Kidney stones> he remains asymptomatic w/o pain, dysuria, blood, etc...    DJD> on OTC analgesics as needed; he continues to work hard, min arthritic symptoms, using OTC meds as needed...    Anxiety> doing satis, not on meds...    Skin cancer> he continues to f/u w/ Derm Q93mo... We reviewed prob list, meds, xrays and labs> see below for updates >> ok Flu shot today...  ~  Jan 13, 2013:  75mo ROV & Carlos [name] has had a good interval w/o new complaints or concerns... 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    HBP> on Aten50, Plendil10, Hyzaar100-12.5;  BP= 118/58 & denies CP, palpit, dizzy,  SOB, edema, etc...    Valv Heart Dis> on Amoxicillin Prn for SBE prophy; he had f/u 11/13 [name]- PAFlutter, mild AI&MR, doing satis/ no change-same meds...    AFlutter, RBBB> on ASA81; denies CP, palpit, dizzy, etc; he had f/u [name] 5/13- doing well & no need for pacer...    Chol> on Simva40, FishOil; on diet & wt stable- FLP 7/14 showed TChol 113, TG 42, HDL 52, LDL 53    GI- polyps, hems> he remains asymptomatic w/o abd pain, n/v, c/d, blood seen; last colon 12/10 w/ divertics, 3mm adenoma, & f/u 59yrs...    Kidney stones> he remains asymptomatic w/o pain, dysuria, blood, etc...    DJD> on OTC analgesics as needed; he continues to work hard, min arthritic symptoms, using OTC meds as needed...    Anxiety> doing satis, not on meds...    Skin cancer> he continues to f/u w/ Derm Q20mo... We reviewed prob list, meds, xrays and labs> see below for updates >>  CXR 5/14 showed heart at upper lim of norm, mild atherosclerotic changes in Ao, bilat nipple shadows, NAD... LABS 5/14:  FLP- at goals on simva40;  Chems- wnl;  CBC- wnl x plat=130K;  TSH=1.04;  PSA=1.70...  ~  July 15, 2013:  62mo ROV & Carlos [name] tells me he had a left kidney stone 7/14, went to ER & was able to pass it, no prob since then; recall hx of kid stone in 1970's that required surgery (this has been his only recurrence since then); advised lots of fluids, avoid cokes/ tea/ etc; his renal funct is normal w/ cr~1...     He remains on ASA81, Aten50, Plendil10, Hyzaar100-12.5 and BP is well controlled at 110/66 today; he feels well w/o CP, palpit, dizzy, SOB, edema, etc;  He has Hx PAFlutter, RBBB, and mild AI/ MR followed by Delton See- stable...    Chol treated w/ diet & Simva40; last FLP 5/14 at goals and stabler yr to yr...     GI is stable and up to date... We reviewed prob list, meds, xrays and labs> see below for updates >> he had the 2014 flu vaccine in Oct...          Problem List:  HYPERTENSION (ICD-401.9) - controlled on ATENOLOL  50mg /d,  FELODIPINE 10mg /d, & LOSARTAN/HCT 100-12.5 daily... ~  see 2DEcho below> ~  CXR 4/11 showed borderline cardiomeg, clear lungs, DJD spine, NAD;  CXR 4/12 same> NAD, WNL... ~  10/11:   BP= 110/66 & similar at home; pt denies HA, fatigue, visual changes, CP, palipit, dizziness, syncope, dyspnea, edema, etc... ~  4/12:  BP= 110/84 & he remains asymptomatic... We decided to decr the ATENOLOL from 50mg  to 25mg /d due to his bradycardia... ~  CXR 4/12 showed normal heart size, clear lungs, NAD... ~  10/12:  BP= 120/72, Pulse= 50 & regular; he went back on his Aten50/d; he remains asymptomatic w/o CP, palpit, dizzy, syncope, SOB, edema, etc... ~  5/13:  BP= 122/80 & he remains bradycardic but doesn't want to cut the BBlocker; denies CP, palpit, dizzy, syncope, SOB, etc. ~  11/13: on Aten50, Plendil10, Hyzaar100-12.5;  BP= 112/82 & denies CP, palpit, dizzy, SOB, edema, etc... ~  5/14: on Aten50, Plendil10, Hyzaar100-12.5;  BP= 118/58 & denies CP, palpit, dizzy, SOB, edema, etc ~  11/14: He remains on ASA81, Aten50, Plendil10, Hyzaar100-12.5 and BP is well controlled at 110/66 today; he feels well w/o CP, palpit, dizzy, SOB, edema, etc.  VALVULAR HEART DISEASE (ICD-424.90) - followed by [name]/Taylor- pt knows to take AMOX prior to dental work...  ~  2DEcho in 2002 w/ mild AI, MR, TR and normal LVF... ~  2DEcho 2/09 showed LV sl dilated w/ mild DD, norm LVF w/o regional wall motion abn & EF= 55-60%;  mild AI, mild-mod MR, dil LA @ 52mm... ~  NuclearStressTest 3/10 showed mild inferobas thinning, no ischemia, EF=48% w/ mild global HK... ~  2DEcho 4/10 showed mild AI & MR, mod dil LA, norm LVF w/ EF= 55-60% (Myoview= false result). ~  2DEcho 6/11 showed mod conc LVH, norm wall motion & EF= 50-55%, mild AI, mild thick MV leaflets w/ mild MR, mod LAdil= 42mm. ~  10/12 & 11/13: he saw [facility]> doing satis, not on anticoagulation, no changes made...  ATRIAL FLUTTER (ICD-427.32) & BUNDLE  BRANCH BLOCK, RIGHT (ICD-426.4) - on ASA 325mg /d and rate control strategy...  ~  prev EKG's w/ IRBBB...2/09 w/ IVCD, NSR, & QS wave in III and aVF (new)... check LVF (nl) and wall motion (nl) on echo... ~  he developed AFlutter 2011 & had evals from [name] 7/11 & [name] 10/11-  they rec rate control & ASA, w/ consideration of cath ablation  if not controlled... ~  2DEcho 6/11 showed mod conc LVH, norm wall motion & EF= 50-55%, mild AI, mild thick MV leaflets w/ mild MR, mod LAdil= 42mm;   ~  Holter 6/11 revealed AFlutter (ave rate 106, max=145, no pauses)... ~  5/13: he saw [name]- doing well, no symptoms, no indication for pacer...  ~  EKG 11/13 showed sinus brady, rate48, rsr', otherw wnl...  HYPERCHOLESTEROLEMIA (ICD-272.0) - on SIMVASTATIN 40mg /d, Fish Oil, & diet...  ~  FLP 1/08 showed TChol 106, TG 59, HDL 48, LDL46 ~  FLP 2/09 showed TChol 123, TG 57, HDL 51, LDL 61 ~  FLP 2/10 showed TChol 117, TG 47, HDL 55, LDL 53 ~  FLP 4/11 showed TChol 105, TG 27, HDL 57, LDL 43 ~  FLP 4/12 on Simva40 showed TChol 104, Tg 34, HDL 51, LDL 46 ~  FLP 5/13 on Simva40 showed TChol 112, TG 39, HDL 55, LDL 49 ~  FLP 5/14 on Simva40 showed TChol 113, TG 42, HDL 52, LDL 53  Hx of HEMORRHOIDS (ICD-455.6) - last colonoscopy in [place] 3/05 by DrNichols was neg x hems... rec f/u 5 yrs due to fam history & he asks to be seen here (refer to GI)... ~  12/10: had f/u colonoscopy by DrStark- 3mm sessile polyp in cecum= path showed fragments of tubular adenoma; +mild divertics...   RENAL CALCULUS, HX OF (ICD-V13.01) - surg in 1974 for kid stone... no recurrence...  DEGENERATIVE JOINT DISEASE (ICD-715.90) - occas neck discomfort without arm pain, numbness, etc... he is very active...  ANXIETY (ICD-300.00)  BASAL CELL CARCINOMA, HX OF (ICD-V10.83) - he follow up w/ Derm in [place] every 3-60months. ~  8/12: note from DrWHoover- full body check & several AKs removed... ~  He continues to f/u w/ Derm every 2mo  he says...  Health Maintenance: ~  GI:  DrStark w/ colon 12/10 as above... ~  GU:  4/11> DRE= neg & PSA= 1.50 ~  Immunizations:  he gets yearly Flu shots,  ?Pneumovax,  Given TDAP 5/13...   Past Surgical History  Procedure Laterality Date  . Kidney stone surgery      Outpatient Encounter Prescriptions as of 07/15/2013  Medication Sig  . amoxicillin (AMOXIL) 500 MG capsule 4 tabs 1 hr prior to dental procedures.   Marland Kitchen aspirin 81 MG EC tablet Take 81 mg by mouth daily.    Marland Kitchen atenolol (TENORMIN) 50 MG tablet Take 50 mg by mouth daily.  . felodipine (PLENDIL) 10 MG 24 hr tablet Take 1 tablet (10 mg total) by mouth daily.  . fish oil-omega-3 fatty acids 1000 MG capsule Take 1 g by mouth daily.    Marland Kitchen losartan-hydrochlorothiazide (HYZAAR) 100-12.5 MG per tablet Take 1 tablet by mouth daily.  . Multiple Vitamin (MULTIVITAMIN) tablet Take 1 tablet by mouth daily.    . simvastatin (ZOCOR) 40 MG tablet Take 1 tablet (40 mg total) by mouth at bedtime.    No Known Allergies   Current Medications, Allergies, Past Medical History, Past Surgical History, Family History, and Social History were reviewed in [employer] record.    Review of Systems         See HPI - all other systems neg except as noted... The patient denies anorexia, fever, weight loss, weight gain, vision loss, decreased hearing, hoarseness, chest pain, syncope, dyspnea on exertion, peripheral edema, prolonged cough, headaches, hemoptysis, abdominal pain, melena, hematochezia, severe indigestion/heartburn, hematuria, incontinence, muscle weakness, suspicious skin lesions, transient blindness, difficulty walking,  depression, unusual weight change, abnormal bleeding, enlarged lymph nodes, and angioedema.    Objective:   Physical Exam     WD, WN, 72 y/o WM in NAD... GENERAL:  Alert & oriented; pleasant & cooperative... HEENT:  [place]/AT, EOM-wnl, PERRLA, Fundi-benign, EACs-clear, TMs-wnl, NOSE-clear,  THROAT-clear & wnl. NECK:  Supple w/ fairROM; no JVD; normal carotid impulses w/o bruits; no thyromegaly or nodules palpated; no lymphadenopathy. CHEST:  Clear to P & A; without wheezes/ rales/ or rhonchi. HEART:  Regular Rhythm;  gr1/6 SEM LSB without rubs or gallops heard... ABDOMEN:  Soft & nontender; normal bowel sounds; no organomegaly or masses detected. (RECTAL:  Neg - prostate 2+ & nontender w/o nodules; stool hematest neg) EXT: without deformities or arthritic changes; no varicose veins/ venous insuffic/ or edema. NEURO:  CN's intact; motor testing normal; sensory testing normal; gait normal & balance OK. DERM:  No lesions noted; no rash etc...  RADIOLOGY DATA:  Reviewed in the EPIC EMR & discussed w/ the patient...  LABORATORY DATA:  Reviewed in the EPIC EMR & discussed w/ the patient...    Assessment & Plan:    HBP>  Controlled on above; continue same...  Hx AFlutter, RBBB, Valv heart dis>  Followed by [name] & [name]; stable, continue same Rx...  CHOL>  Stable on diet + Simva40; continue same...  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  Other medical issues as noted...    Patient's Medications  New Prescriptions   No medications on file  Previous Medications   AMOXICILLIN (AMOXIL) 500 MG CAPSULE    4 tabs 1 hr prior to dental procedures.    ASPIRIN 81 MG EC TABLET    Take 81 mg by mouth daily.     ATENOLOL (TENORMIN) 50 MG TABLET    Take 50 mg by mouth daily.   FELODIPINE (PLENDIL) 10 MG 24 HR TABLET    Take 1 tablet (10 mg total) by mouth daily.   FISH OIL-OMEGA-3 FATTY ACIDS 1000 MG CAPSULE    Take 1 g by mouth daily.     LOSARTAN-HYDROCHLOROTHIAZIDE (HYZAAR) 100-12.5 MG PER TABLET    Take 1 tablet by mouth daily.   MULTIPLE VITAMIN (MULTIVITAMIN) TABLET    Take 1 tablet by mouth daily.     SIMVASTATIN (ZOCOR) 40 MG TABLET    Take 1 tablet (40 mg total) by mouth at bedtime.  Modified Medications   No  medications on file  Discontinued Medications   No medications on file

## 2013-07-15 NOTE — Patient Instructions (Signed)
Today we updated your med list in our EPIC system...    Continue your current medications the same...  Call for any questions...  Let's plan a follow up visit in 6mo, sooner if needed for problems...   

## 2014-01-05 ENCOUNTER — Ambulatory Visit (INDEPENDENT_AMBULATORY_CARE_PROVIDER_SITE_OTHER): Payer: Medicare Other | Admitting: Internal Medicine

## 2014-01-05 ENCOUNTER — Encounter: Payer: Self-pay | Admitting: Internal Medicine

## 2014-01-05 VITALS — BP 118/64 | HR 46 | Ht 73.0 in | Wt 183.0 lb

## 2014-01-05 DIAGNOSIS — I493 Ventricular premature depolarization: Secondary | ICD-10-CM

## 2014-01-05 DIAGNOSIS — I4892 Unspecified atrial flutter: Secondary | ICD-10-CM

## 2014-01-05 DIAGNOSIS — R001 Bradycardia, unspecified: Secondary | ICD-10-CM

## 2014-01-05 DIAGNOSIS — I10 Essential (primary) hypertension: Secondary | ICD-10-CM

## 2014-01-05 DIAGNOSIS — I4949 Other premature depolarization: Secondary | ICD-10-CM

## 2014-01-05 DIAGNOSIS — I498 Other specified cardiac arrhythmias: Secondary | ICD-10-CM

## 2014-01-05 NOTE — Assessment & Plan Note (Signed)
His blood pressure is well controlled. No change in meds.  

## 2014-01-05 NOTE — Patient Instructions (Signed)
Your physician recommends that you schedule a follow-up appointment as needed  

## 2014-01-05 NOTE — Assessment & Plan Note (Signed)
He is currently asymptomatic. He will undergo watchful waiting. I discussed the warning signs that he might have a problem and need a PPM.

## 2014-01-05 NOTE — Progress Notes (Signed)
HPI Carlos [name] returns today for followup. He is a very pleasant 73 year old man with palpitations and documented paroxysmal atrial flutter as well as brief episodes of bradycardia. He has been nearly asymptomatic. He denies chest pain, shortness of breath, syncope, and continues to work vigorously at [employer]. He is able to keep up with men much younger than himself. He has not had any atrial fib over the past 2 years. He denies palpitations.  No Known Allergies   Current Outpatient Prescriptions  Medication Sig Dispense Refill  . amoxicillin (AMOXIL) 500 MG capsule 4 tabs 1 hr prior to dental procedures.       Marland Kitchen aspirin 81 MG EC tablet Take 81 mg by mouth daily.        Marland Kitchen atenolol (TENORMIN) 50 MG tablet Take 50 mg by mouth daily.      . felodipine (PLENDIL) 10 MG 24 hr tablet Take 1 tablet (10 mg total) by mouth daily.  90 tablet  3  . fish oil-omega-3 fatty acids 1000 MG capsule Take 1 g by mouth daily.        Marland Kitchen losartan-hydrochlorothiazide (HYZAAR) 100-12.5 MG per tablet Take 1 tablet by mouth daily.  90 tablet  3  . Multiple Vitamin (MULTIVITAMIN) tablet Take 1 tablet by mouth daily.        . simvastatin (ZOCOR) 40 MG tablet Take 1 tablet (40 mg total) by mouth at bedtime.  90 tablet  3   No current facility-administered medications for this visit.     Past Medical History  Diagnosis Date  . Allergic rhinitis   . HTN (hypertension)   . Aortic insufficiency     mild... echo... 02/2010  . Mitral regurgitation     mild... echo.. 02/2010  . LVH (left ventricular hypertrophy)     moderate... echo... 02/2010  . Diastolic dysfunction     EF 50-55%... echo.. 02/2010 (55-60% echo 2009)  . Incomplete RBBB     IRBBB; sinus bradycardia w/ PVCs  . Sinus bradycardia   . PVC (premature ventricular contraction)   . Atrial flutter 02/03/2010    Consult by Dr. Lovena Le... June, 2011.... plan rate control...  Coumadin not needed... ablation if rate not controlled well  . Dyslipidemia   .  Hemorrhoids   . Renal calculus   . Degenerative joint disease   . Anxiety   . Basal cell carcinoma   . Ejection fraction     EF 50-55%, echo, June, 2011  . Chest pain     Nuclear, March, 2010, no ischemia    ROS:   All systems reviewed and negative except as noted in the HPI.   Past Surgical History  Procedure Laterality Date  . Kidney stone surgery       Family History  Problem Relation Age of Onset  . Hypertension Mother      History   Social History  . Marital Status: Married    Spouse Name: Carlos [name]    Number of Children: 2  . Years of Education: N/A   Occupational History  . Not on file.   Social History Main Topics  . Smoking status: Never Smoker   . Smokeless tobacco: Never Used  . Alcohol Use: 1.8 oz/week    3 Glasses of wine per week     Comment: 3 glasses of wine per week  . Drug Use: No  . Sexual Activity: Not on file   Other Topics Concern  . Not on file   Social History Narrative  .  No narrative on file     BP 118/64  Pulse 46  Ht 6\' 1"  (1.854 m)  Wt 183 lb (83.008 kg)  BMI 24.15 kg/m2  Physical Exam:  Well appearing NAD HEENT: Unremarkable Neck:  No JVD, no thyromegally Lymphatics:  No adenopathy Back:  No CVA tenderness Lungs:  Clear with no wheezes.  HEART:  Regular brady rhythm, no murmurs, no rubs, no clicks Abd:  soft, positive bowel sounds, no organomegally, no rebound, no guarding Ext:  2 plus pulses, no edema, no cyanosis, no clubbing Skin:  No rashes no nodules Neuro:  CN II through XII intact, motor grossly intact   Assess/Plan:

## 2014-01-05 NOTE — Assessment & Plan Note (Signed)
He has had no symptomatic atrial flutter over the past two years. Will follow.

## 2014-01-20 ENCOUNTER — Ambulatory Visit (INDEPENDENT_AMBULATORY_CARE_PROVIDER_SITE_OTHER): Payer: Medicare Other | Admitting: Pulmonary Disease

## 2014-01-20 ENCOUNTER — Other Ambulatory Visit (INDEPENDENT_AMBULATORY_CARE_PROVIDER_SITE_OTHER): Payer: Medicare Other

## 2014-01-20 ENCOUNTER — Encounter: Payer: Self-pay | Admitting: Pulmonary Disease

## 2014-01-20 VITALS — BP 118/64 | HR 43 | Temp 97.6°F | Ht 72.0 in | Wt 183.0 lb

## 2014-01-20 DIAGNOSIS — Z860100 Personal history of colon polyps, unspecified: Secondary | ICD-10-CM

## 2014-01-20 DIAGNOSIS — E785 Hyperlipidemia, unspecified: Secondary | ICD-10-CM

## 2014-01-20 DIAGNOSIS — I10 Essential (primary) hypertension: Secondary | ICD-10-CM

## 2014-01-20 DIAGNOSIS — Z87442 Personal history of urinary calculi: Secondary | ICD-10-CM

## 2014-01-20 DIAGNOSIS — I340 Nonrheumatic mitral (valve) insufficiency: Secondary | ICD-10-CM

## 2014-01-20 DIAGNOSIS — I4892 Unspecified atrial flutter: Secondary | ICD-10-CM

## 2014-01-20 DIAGNOSIS — I059 Rheumatic mitral valve disease, unspecified: Secondary | ICD-10-CM

## 2014-01-20 DIAGNOSIS — N320 Bladder-neck obstruction: Secondary | ICD-10-CM

## 2014-01-20 DIAGNOSIS — M1990 Unspecified osteoarthritis, unspecified site: Secondary | ICD-10-CM

## 2014-01-20 DIAGNOSIS — I4510 Unspecified right bundle-branch block: Secondary | ICD-10-CM

## 2014-01-20 DIAGNOSIS — F411 Generalized anxiety disorder: Secondary | ICD-10-CM

## 2014-01-20 LAB — CBC WITH DIFFERENTIAL/PLATELET
BASOS PCT: 0.5 % (ref 0.0–3.0)
Basophils Absolute: 0 10*3/uL (ref 0.0–0.1)
EOS ABS: 0.1 10*3/uL (ref 0.0–0.7)
EOS PCT: 1.6 % (ref 0.0–5.0)
HCT: 46.7 % (ref 39.0–52.0)
HEMOGLOBIN: 15.7 g/dL (ref 13.0–17.0)
Lymphocytes Relative: 32.7 % (ref 12.0–46.0)
Lymphs Abs: 1.9 10*3/uL (ref 0.7–4.0)
MCHC: 33.6 g/dL (ref 30.0–36.0)
MCV: 90.6 fl (ref 78.0–100.0)
MONO ABS: 0.5 10*3/uL (ref 0.1–1.0)
Monocytes Relative: 9.2 % (ref 3.0–12.0)
NEUTROS ABS: 3.2 10*3/uL (ref 1.4–7.7)
NEUTROS PCT: 56 % (ref 43.0–77.0)
Platelets: 136 10*3/uL — ABNORMAL LOW (ref 150.0–400.0)
RBC: 5.16 Mil/uL (ref 4.22–5.81)
RDW: 13.4 % (ref 11.5–15.5)
WBC: 5.7 10*3/uL (ref 4.0–10.5)

## 2014-01-20 LAB — BASIC METABOLIC PANEL
BUN: 16 mg/dL (ref 6–23)
CALCIUM: 9.6 mg/dL (ref 8.4–10.5)
CO2: 32 mEq/L (ref 19–32)
CREATININE: 0.9 mg/dL (ref 0.4–1.5)
Chloride: 102 mEq/L (ref 96–112)
GFR: 93.75 mL/min (ref >60.00)
Glucose, Bld: 93 mg/dL (ref 70–99)
Potassium: 4 mEq/L (ref 3.5–5.1)
Sodium: 140 mEq/L (ref 135–145)

## 2014-01-20 LAB — LIPID PANEL
CHOL/HDL RATIO: 2
CHOLESTEROL: 97 mg/dL (ref 0–200)
HDL: 50.7 mg/dL (ref >39.00)
LDL CALC: 42 mg/dL (ref 0–99)
TRIGLYCERIDES: 20 mg/dL (ref 0.0–149.0)
VLDL: 4 mg/dL (ref 0.0–40.0)

## 2014-01-20 LAB — HEPATIC FUNCTION PANEL
ALT: 21 U/L (ref 0–53)
AST: 23 U/L (ref 0–37)
Albumin: 4.3 g/dL (ref 3.5–5.2)
Alkaline Phosphatase: 57 U/L (ref 39–117)
BILIRUBIN DIRECT: 0.3 mg/dL (ref 0.0–0.3)
BILIRUBIN TOTAL: 1.3 mg/dL — AB (ref 0.2–1.2)
Total Protein: 6.8 g/dL (ref 6.0–8.3)

## 2014-01-20 LAB — TSH: TSH: 0.85 u[IU]/mL (ref 0.35–4.50)

## 2014-01-20 LAB — PSA: PSA: 1.88 ng/mL (ref 0.10–4.00)

## 2014-01-20 MED ORDER — FELODIPINE ER 10 MG PO TB24: 10.0000 mg | ORAL_TABLET | Freq: Every day | ORAL | Status: DC

## 2014-01-20 MED ORDER — SIMVASTATIN 40 MG PO TABS: 40.0000 mg | ORAL_TABLET | Freq: Every day | ORAL | Status: DC

## 2014-01-20 MED ORDER — LOSARTAN POTASSIUM-HCTZ 100-12.5 MG PO TABS: 1.0000 | ORAL_TABLET | Freq: Every day | ORAL | Status: DC

## 2014-01-20 NOTE — Progress Notes (Signed)
Subjective:    Patient ID: Carlos [name], male    DOB: Mar 04, 1941, 73 y.o.   MRN: [id number]  HPI 73 y/o WM here for a follow up visit... he has multiple medical problems as noted below...    ~  July 04, 2012:  74mo ROV & Carlos [name] continues to do well- no new complaints or concerns, he requests Epi-pen for fire ant exposure at work... 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    HBP> on Aten50, Plendil10, Hyzaar100-12.5;  BP= 112/82 & denies CP, palpit, dizzy, SOB, edema, etc...    Valv Heart Dis> on Amoxicillin Prn for SBE prophy...    AFlutter, RBBB> on ASA81; denies CP, palpit, dizzy, etc; he had f/u [name] 5/13- doing well & no need for pacer...    Chol> on Simva40, FishOil; on diet & wt stable- FLP 5/13 showed TChol 112, TG 39, HDL 55, LDL 49    GI- polyps, hems> he remains asymptomatic w/o abd pain, n/v, c/d, blood seen...    Kidney stones> he remains asymptomatic w/o pain, dysuria, blood, etc...    DJD> on OTC analgesics as needed; he continues to work hard, min arthritic symptoms, using OTC meds as needed...    Anxiety> doing satis, not on meds...    Skin cancer> he continues to f/u w/ Derm Q34mo... We reviewed prob list, meds, xrays and labs> see below for updates >> ok Flu shot today...  ~  Jan 13, 2013:  72mo ROV & Carlos [name] has had a good interval w/o new complaints or concerns... 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    HBP> on Aten50, Plendil10, Hyzaar100-12.5;  BP= 118/58 & denies CP, palpit, dizzy, SOB, edema, etc...    Valv Heart Dis> on Amoxicillin Prn for SBE prophy; he had f/u 11/13 [name]- PAFlutter, mild AI&MR, doing satis/ no change-same meds...    AFlutter, RBBB> on ASA81; denies CP, palpit, dizzy, etc; he had f/u [name] 5/13- doing well & no need for pacer...    Chol> on Simva40, FishOil; on  diet & wt stable- FLP 7/14 showed TChol 113, TG 42, HDL 52, LDL 53    GI- polyps, hems> he remains asymptomatic w/o abd pain, n/v, c/d, blood seen; last colon 12/10 w/ divertics, 12mm adenoma, & f/u 26yrs...    Kidney stones> he remains asymptomatic w/o pain, dysuria, blood, etc...    DJD> on OTC analgesics as needed; he continues to work hard, min arthritic symptoms, using OTC meds as needed...    Anxiety> doing satis, not on meds...    Skin cancer> he continues to f/u w/ Derm Q61mo... We reviewed prob list, meds, xrays and labs> see below for updates >>   CXR 5/14 showed heart at upper lim of norm, mild atherosclerotic changes in Ao, bilat nipple shadows, NAD...  LABS 5/14:  FLP- at goals on simva40;  Chems- wnl;  CBC- wnl x plat=130K;  TSH=1.04;  PSA=1.70...  ~  July 15, 2013:  78mo ROV & Carlos [name] tells me he had a left kidney stone 7/14, went to ER & was able to pass it, no prob since then; recall hx of kid stone in 1970's that required surgery (this has been his only recurrence since then); advised lots of fluids, avoid cokes/ tea/ etc; his renal funct is normal  w/ cr~1...     He remains on ASA81, Aten50, Plendil10, Hyzaar100-12.5 and BP is well controlled at 110/66 today; he feels well w/o CP, palpit, dizzy, SOB, edema, etc;  He has Hx PAFlutter, RBBB, and mild AI/ MR followed by Benay Spice- stable...    Chol treated w/ diet & Simva40; last FLP 5/14 at goals and stabler yr to yr...     GI is stable and up to date... We reviewed prob list, meds, xrays and labs> see below for updates >> he had the 2014 flu vaccine in Oct...  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    HBP> on Aten50, Plendil10, Hyzaar100-12.5;  BP= 118/64 & denies  CP, palpit, dizzy, SOB, edema, etc...    Valv Heart Dis> on Amoxicillin Prn for SBE prophy; he had f/u 5/15 [name]- PAFlutter, mild AI&MR, doing satis/ no change-same meds...    AFlutter, RBBB> on ASA81; denies CP, palpit, dizzy, etc; he had f/u [name] 5/13- doing well & no need for pacer...    Chol> on Simva40, FishOil; on diet & wt stable- FLP 5/15 showed TChol 97, TG 20, HDL 51, LDL 42    GI- polyps, hems> he remains asymptomatic w/o abd pain, n/v, c/d, blood seen; last colon 12/10 w/ divertics, 74mm adenoma, & f/u 30yrs...    Kidney stones> he remains asymptomatic w/o pain, dysuria, blood, etc...    DJD> on OTC analgesics as needed; he continues to work hard, min arthritic symptoms, using OTC meds as needed...    Anxiety> doing satis, not on meds...    Skin cancer> he continues to f/u w/ Derm Q41mo... We reviewed prob list, meds, xrays and labs> see below for updates >>  EKG 5/15 showed SBrady, rate46, RBBB, NAD... LABS 5/15:  FLP- at goals on Simva40;  Chems- wnl;  CBC- wnl;  TSH=0.85;  PSA=1.88           Problem List:  HYPERTENSION (ICD-401.9) - controlled on ATENOLOL 50mg /d,  FELODIPINE 10mg /d, & LOSARTAN/HCT 100-12.5 daily... ~  see 2DEcho below> ~  CXR 4/11 showed borderline cardiomeg, clear lungs, DJD spine, NAD;  CXR 4/12 same> NAD, WNL... ~  10/11:   BP= 110/66 & similar at home; pt denies HA, fatigue, visual changes, CP, palipit, dizziness, syncope, dyspnea, edema, etc... ~  4/12:  BP= 110/84 & he remains asymptomatic... We decided to decr the ATENOLOL from 50mg  to 25mg /d due to his bradycardia... ~  CXR 4/12 showed normal heart size, clear lungs, NAD... ~  10/12:  BP= 120/72, Pulse= 50 & regular; he went back on his Aten50/d; he remains asymptomatic w/o CP, palpit, dizzy, syncope, SOB, edema, etc... ~  5/13:  BP= 122/80 & he remains bradycardic but doesn't want to cut the BBlocker; denies CP, palpit, dizzy, syncope, SOB, etc. ~  11/13: on Aten50, Plendil10, Hyzaar100-12.5;   BP= 112/82 & denies CP, palpit, dizzy, SOB, edema, etc... ~  5/14: on Aten50, Plendil10, Hyzaar100-12.5;  BP= 118/58 & denies CP, palpit, dizzy, SOB, edema, etc ~  CXR 5/14 showed heart at upper lim of norm, mild atherosclerotic changes in Ao, bilat nipple shadows, NAD ~  11/14: He remains on ASA81, Aten50, Plendil10, Hyzaar100-12.5 and BP is well  controlled at 110/66 today; he feels well w/o CP, palpit, dizzy, SOB, edema, etc.  VALVULAR HEART DISEASE (ICD-424.90) - followed by [name]/Taylor- pt knows to take AMOX prior to dental work...  ~  2DEcho in 2002 w/ mild AI, MR, TR and normal LVF... ~  2DEcho 2/09 showed LV sl dilated w/ mild DD, norm LVF w/o regional wall motion abn & EF= 55-60%;  mild AI, mild-mod MR, dil LA @ 55mm... ~  NuclearStressTest 3/10 showed mild inferobas thinning, no ischemia, EF=48% w/ mild global HK... ~  2DEcho 4/10 showed mild AI & MR, mod dil LA, norm LVF w/ EF= 55-60% (Myoview= false result). ~  2DEcho 6/11 showed mod conc LVH, norm wall motion & EF= 50-55%, mild AI, mild thick MV leaflets w/ mild MR, mod LAdil= 77mm. ~  10/12 & 11/13: he saw [facility] f/u> doing satis, not on anticoagulation, no changes made... ~   EKG 5/15 showed SBrady, rate46, RBBB, NAD...  ATRIAL FLUTTER (ICD-427.32) & BUNDLE BRANCH BLOCK, RIGHT (ICD-426.4) - on ASA 325mg /d and rate control strategy...  ~  prev EKG's w/ IRBBB...2/09 w/ IVCD, NSR, & QS wave in III and aVF (new)... check LVF (nl) and wall motion (nl) on echo... ~  he developed AFlutter 2011 & had evals from [name] 7/11 & [name] 10/11-  they rec rate control & ASA, w/ consideration of cath ablation if not controlled... ~  2DEcho 6/11 showed mod conc LVH, norm wall motion & EF= 50-55%, mild AI, mild thick MV leaflets w/ mild MR, mod LAdil= 46mm;   ~  Holter 6/11 revealed AFlutter (ave rate 106, max=145, no pauses)... ~  5/13: he saw [name]- doing well, no symptoms, no indication for pacer...  ~  EKG 11/13 showed sinus  brady, rate48, rsr', otherw wnl...  HYPERCHOLESTEROLEMIA (ICD-272.0) - on SIMVASTATIN 40mg /d, Fish Oil, & diet...  ~  [place] 1/08 showed TChol 106, TG 59, HDL 48, LDL46 ~  FLP 2/09 showed TChol 123, TG 57, HDL 51, LDL 61 ~  FLP 2/10 showed TChol 117, TG 47, HDL 55, LDL 53 ~  FLP 4/11 showed TChol 105, TG 27, HDL 57, LDL 43 ~  FLP 4/12 on Simva40 showed TChol 104, Tg 34, HDL 51, LDL 46 ~  FLP 5/13 on Simva40 showed TChol 112, TG 39, HDL 55, LDL 49 ~  FLP 5/14 on Simva40 showed TChol 113, TG 42, HDL 52, LDL 53 ~  FLP 5/15 on Simva40 showed  TChol 97, TG 20, HDL 51, LDL 42  Hx of HEMORRHOIDS (ICD-455.6) - last colonoscopy in [place] 3/05 by DrNichols was neg x hems... rec f/u 5 yrs due to fam history & he asks to be seen here (refer to GI)... ~  12/10: had f/u colonoscopy by DrStark- 95mm sessile polyp in cecum= path showed fragments of tubular adenoma; +mild divertics...   RENAL CALCULUS, HX OF (ICD-V13.01) - surg in 1974 for kid stone...  ~  7/14:  CT Abd & Pelvis showed enhancement of the left kidney w/ marked perineph stranding, mod left hydroureteronephrosis, 18mm left UVJ stone, tortuous left ureter, enlarged prostatelumbar DJD...  DEGENERATIVE JOINT DISEASE (ICD-715.90) - occas neck discomfort without arm pain, numbness, etc... he is very active...  ANXIETY (ICD-300.00)  BASAL CELL CARCINOMA, HX OF (ICD-V10.83) - he follow up w/ Derm in [place] every 3-48months. ~  8/12: note from DrWHoover- full body check & several AKs removed... ~  He continues to f/u w/ Derm every 82mo he says...  Health Maintenance: ~  GI:  DrStark w/ colon 12/10 as above... ~  GU:  4/11> DRE= neg & PSA= 1.50 ~  Immunizations:  he gets yearly Flu shots,  ?Pneumovax,  Given TDAP 5/13...   Past Surgical History  Procedure Laterality Date  . Kidney stone surgery      Outpatient Encounter Prescriptions as of 01/20/2014  Medication Sig  . amoxicillin (AMOXIL) 500 MG capsule 4 tabs 1 hr prior to dental procedures.    Marland Kitchen aspirin 81 MG EC tablet Take 81 mg by mouth daily.    Marland Kitchen atenolol (TENORMIN) 50 MG tablet Take 50 mg by mouth daily.  . felodipine (PLENDIL) 10 MG 24 hr tablet Take 1 tablet (10 mg total) by mouth daily.  . fish oil-omega-3 fatty acids 1000 MG capsule Take 1 g by mouth daily.    Marland Kitchen losartan-hydrochlorothiazide (HYZAAR) 100-12.5 MG per tablet Take 1 tablet by mouth daily.  . Multiple Vitamin (MULTIVITAMIN) tablet Take 1 tablet by mouth daily.    . simvastatin (ZOCOR) 40 MG tablet Take 1 tablet (40 mg total) by mouth at bedtime.    No Known Allergies   Current Medications, Allergies, Past Medical History, Past Surgical History, Family History, and Social History were reviewed in [employer] record.    Review of Systems         See HPI - all other systems neg except as noted... The patient denies anorexia, fever, weight loss, weight gain, vision loss, decreased hearing, hoarseness, chest pain, syncope, dyspnea on exertion, peripheral edema, prolonged cough, headaches, hemoptysis, abdominal pain, melena, hematochezia, severe indigestion/heartburn, hematuria, incontinence, muscle weakness, suspicious skin lesions, transient blindness, difficulty walking, depression, unusual weight change, abnormal bleeding, enlarged lymph nodes, and angioedema.    Objective:   Physical Exam     WD, WN, 73 y/o WM in NAD... GENERAL:  Alert & oriented; pleasant & cooperative... HEENT:  [place]/AT, EOM-wnl, PERRLA, Fundi-benign, EACs-clear, TMs-wnl, NOSE-clear, THROAT-clear & wnl. NECK:  Supple w/ fairROM; no JVD; normal carotid impulses w/o bruits; no thyromegaly or nodules palpated; no lymphadenopathy. CHEST:  Clear to P & A; without wheezes/ rales/ or rhonchi. HEART:  Regular Rhythm;  gr1/6 SEM LSB without rubs or gallops heard... ABDOMEN:  Soft & nontender; normal bowel sounds; no organomegaly or masses detected. (RECTAL:  Neg - prostate 2+ & nontender w/o nodules; stool hematest  neg) EXT: without deformities or arthritic changes; no varicose veins/ venous insuffic/ or edema. NEURO:  CN's intact; motor testing normal; sensory testing normal; gait normal & balance OK. DERM:  No lesions noted; no rash etc...  RADIOLOGY DATA:  Reviewed in the EPIC EMR & discussed w/ the patient...  LABORATORY DATA:  Reviewed in the EPIC EMR & discussed w/ the patient...    Assessment & Plan:    HBP>  Controlled on 12meds above; continue same...  Hx AFlutter, RBBB, Valv heart dis>  Followed by [name] & [name]; stable, continue same Rx...  CHOL>  Stable on diet + Simva40; continue same...  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  Other medical issues as noted...    Patient's Medications  New Prescriptions   No medications on file  Previous Medications   AMOXICILLIN (AMOXIL) 500 MG CAPSULE    4 tabs 1 hr prior to dental procedures.    ASPIRIN 81 MG EC TABLET    Take 81 mg by mouth daily.     ATENOLOL (TENORMIN) 50 MG TABLET    Take 50  mg by mouth daily.   FISH OIL-OMEGA-3 FATTY ACIDS 1000 MG CAPSULE    Take 1 g by mouth daily.     MULTIPLE VITAMIN (MULTIVITAMIN) TABLET    Take 1 tablet by mouth daily.    Modified Medications   Modified Medication Previous Medication   FELODIPINE (PLENDIL) 10 MG 24 HR TABLET felodipine (PLENDIL) 10 MG 24 hr tablet      Take 1 tablet (10 mg total) by mouth daily.    Take 1 tablet (10 mg total) by mouth daily.   LOSARTAN-HYDROCHLOROTHIAZIDE (HYZAAR) 100-12.5 MG PER TABLET losartan-hydrochlorothiazide (HYZAAR) 100-12.5 MG per tablet      Take 1 tablet by mouth daily.    Take 1 tablet by mouth daily.   SIMVASTATIN (ZOCOR) 40 MG TABLET simvastatin (ZOCOR) 40 MG tablet      Take 1 tablet (40 mg total) by mouth at bedtime.    Take 1 tablet (40 mg total) by mouth at bedtime.  Discontinued Medications   No medications on file

## 2014-01-20 NOTE — Patient Instructions (Signed)
Today we updated your med list in our EPIC system...    Continue your current medications the same...  Today we did your follow up FASTING blood work...    We will contact you w/ the results when available...   Call for any questions...  Let's plan a follow up visit in 6mo, sooner if needed for problems...   

## 2014-01-22 NOTE — Progress Notes (Signed)
Quick Note:  LMTCB ______ 

## 2014-02-23 ENCOUNTER — Other Ambulatory Visit: Payer: Self-pay | Admitting: Pulmonary Disease

## 2014-03-17 ENCOUNTER — Other Ambulatory Visit: Payer: Self-pay | Admitting: Pulmonary Disease

## 2014-05-03 IMAGING — CR DG CHEST 2V
2 series · 2 of 2 positions shown · non-contrast
Comparison: None

CLINICAL DATA: Physical exam, hypertension, arrhythmia, valvular
disease

CHEST - 2 VIEW

[view not recorded (1 of 2)]
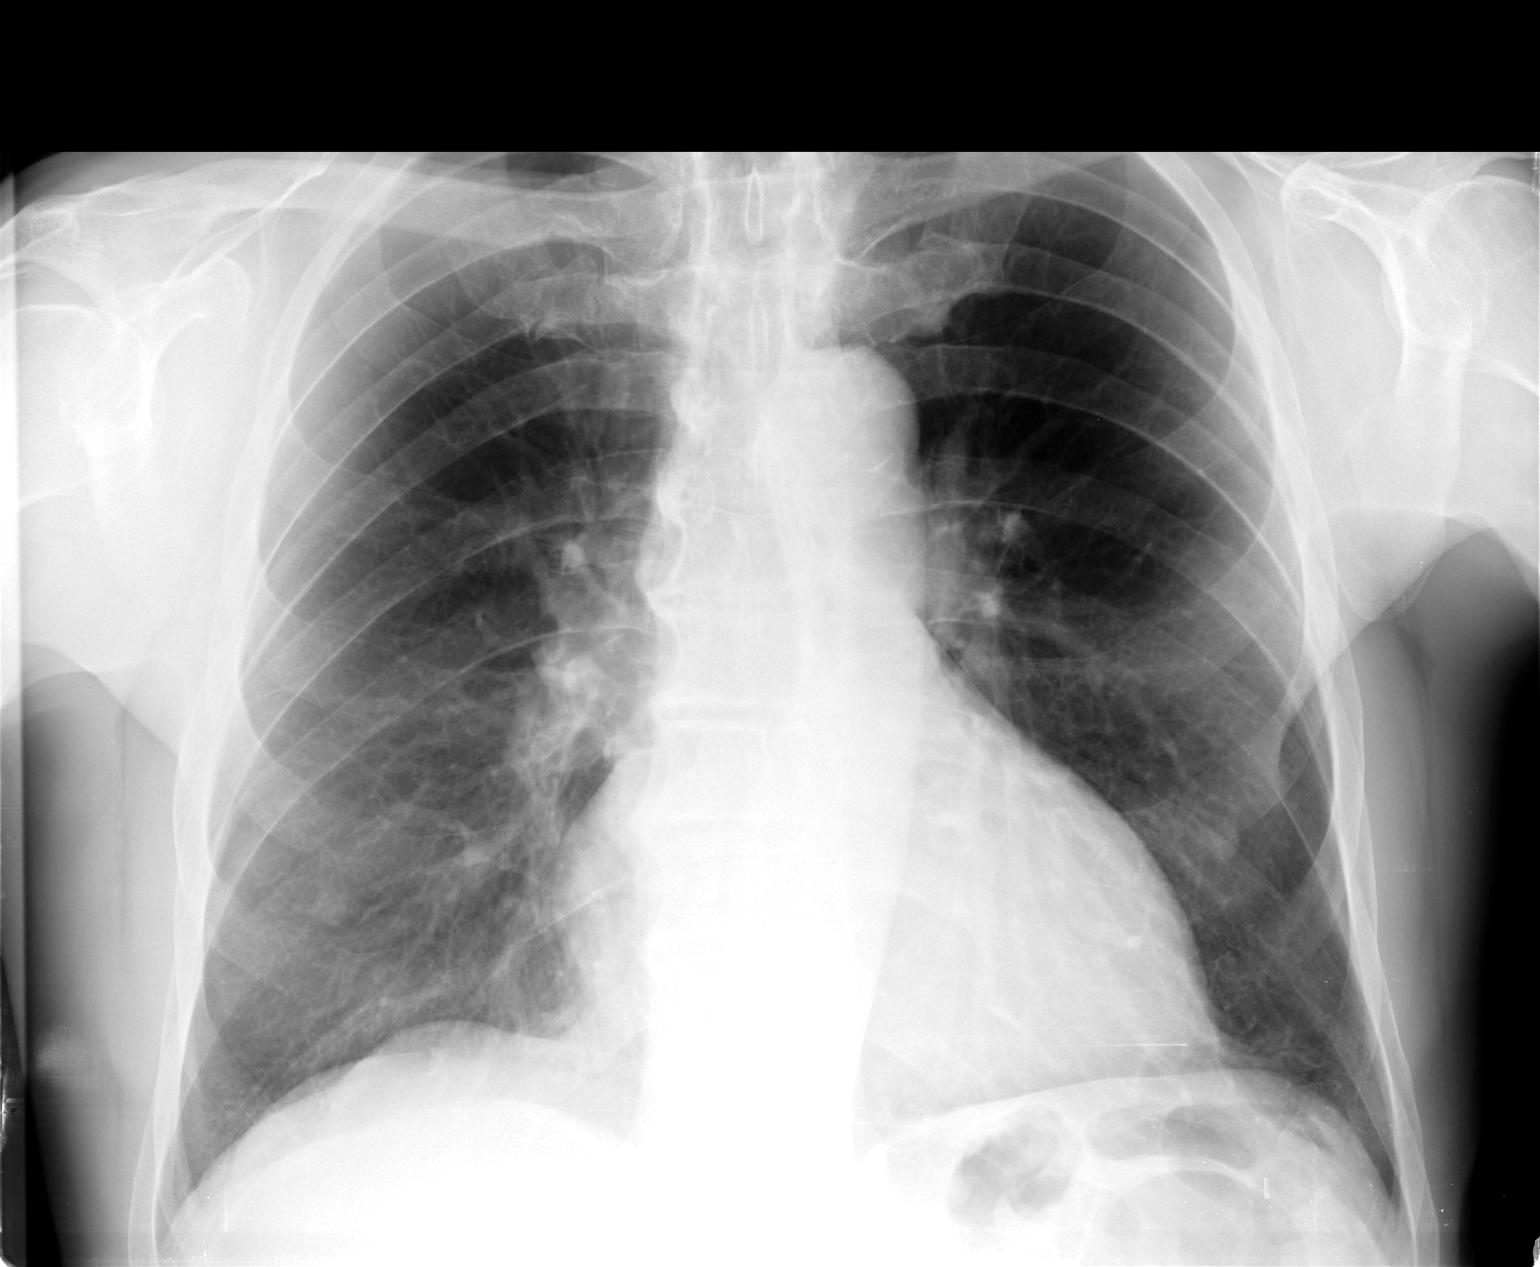

[view not recorded (2 of 2)]
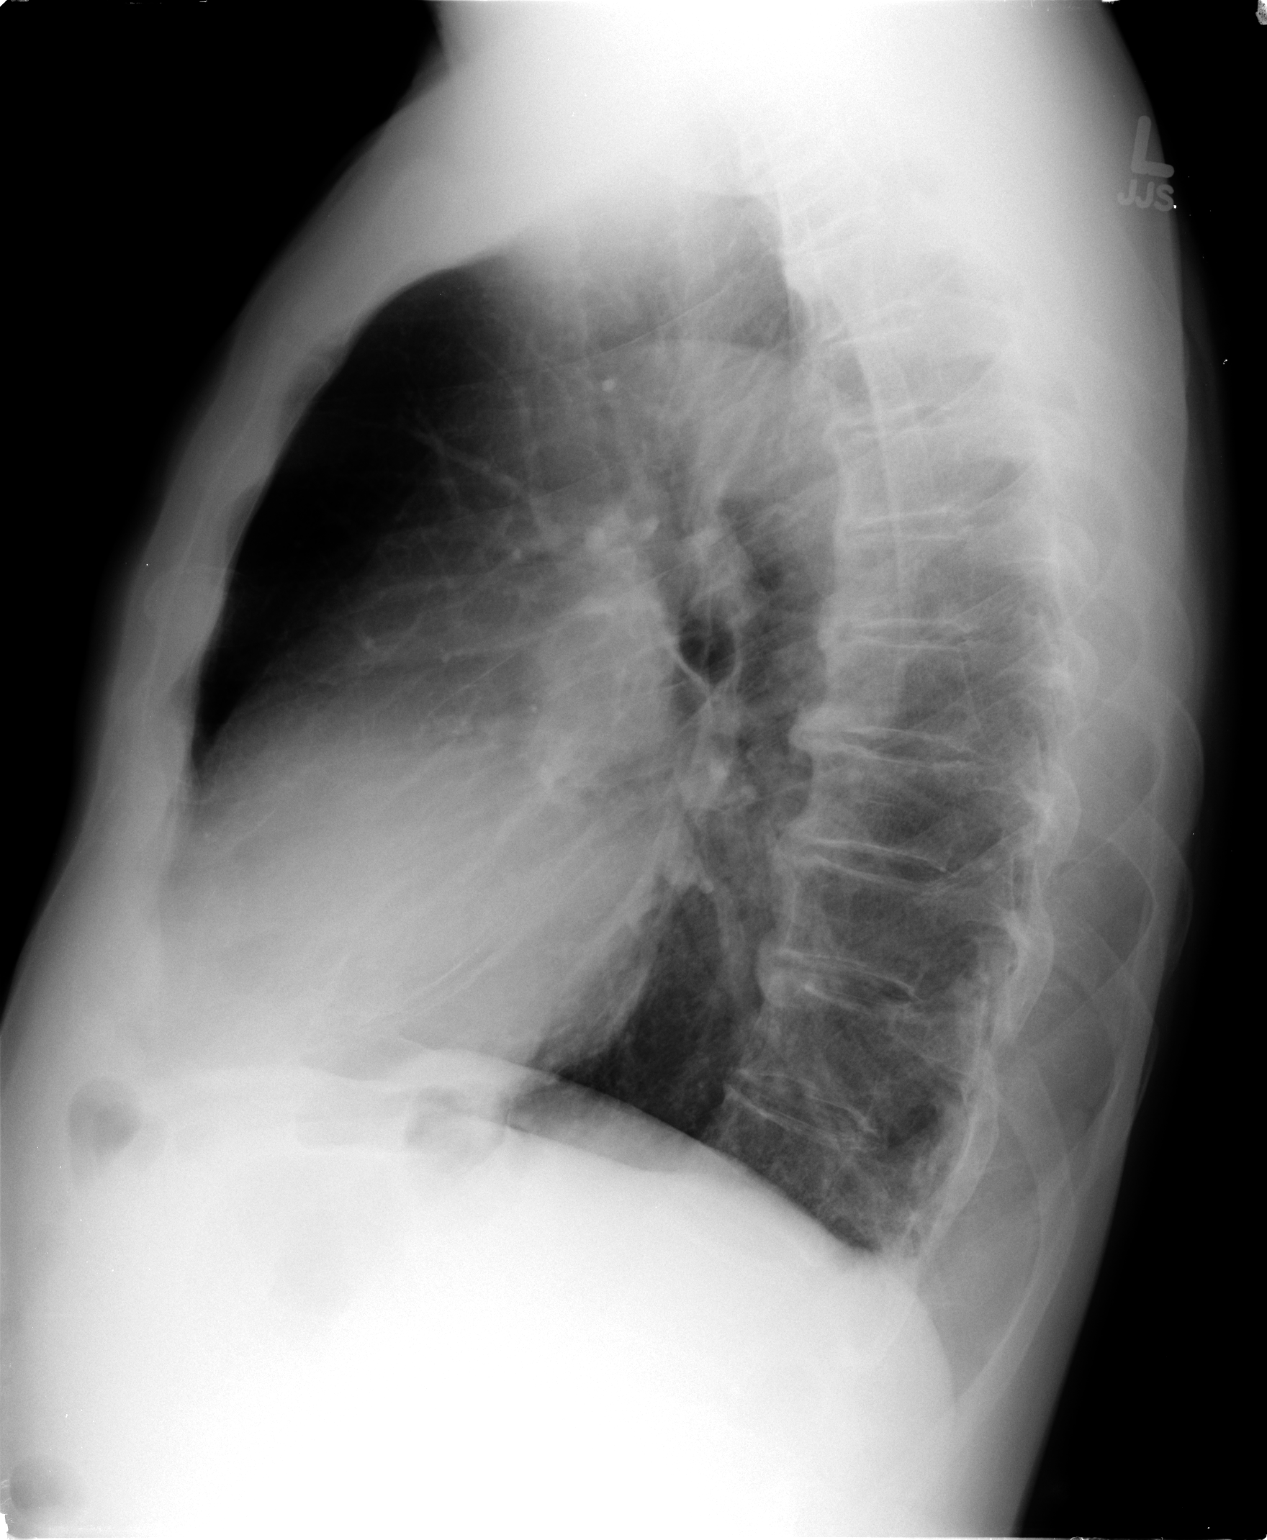

[2 of 2 positions shown; findings below may reference images not displayed]

FINDINGS: Upper normal heart size.
Normal mediastinal contours and pulmonary vascularity.
Minimal atherosclerotic calcification aorta.
Probable bilateral nipple shadows unchanged.
Emphysematous changes without infiltrate, pleural effusion or
pneumothorax.
No acute osseous findings.
IMPRESSION: Bilateral nipple shadows.
Emphysematous changes.
No acute abnormalities.

## 2014-06-02 ENCOUNTER — Encounter: Payer: Self-pay | Admitting: Gastroenterology

## 2014-06-15 ENCOUNTER — Encounter: Payer: Self-pay | Admitting: Gastroenterology

## 2014-07-09 ENCOUNTER — Encounter: Payer: Self-pay | Admitting: Cardiology

## 2014-07-09 ENCOUNTER — Ambulatory Visit (INDEPENDENT_AMBULATORY_CARE_PROVIDER_SITE_OTHER): Payer: Medicare Other | Admitting: Cardiology

## 2014-07-09 VITALS — BP 108/60 | HR 50 | Ht 72.0 in | Wt 179.0 lb

## 2014-07-09 DIAGNOSIS — E785 Hyperlipidemia, unspecified: Secondary | ICD-10-CM

## 2014-07-09 DIAGNOSIS — R001 Bradycardia, unspecified: Secondary | ICD-10-CM

## 2014-07-09 DIAGNOSIS — I483 Typical atrial flutter: Secondary | ICD-10-CM

## 2014-07-09 DIAGNOSIS — I340 Nonrheumatic mitral (valve) insufficiency: Secondary | ICD-10-CM

## 2014-07-09 DIAGNOSIS — I10 Essential (primary) hypertension: Secondary | ICD-10-CM

## 2014-07-09 NOTE — Assessment & Plan Note (Signed)
Historically he had atrial flutter. He is now in sinus rhythm. The original plan was for ablation if his rate was not well controlled. When he turns 83, reconsideration will have to be given as to whether not anticoagulation is needed.

## 2014-07-09 NOTE — Assessment & Plan Note (Signed)
He had mild mitral regurgitation in the past. I feel he does not need a 2-D echo at this time.

## 2014-07-09 NOTE — Assessment & Plan Note (Signed)
He has been on simvastatin 40 mg for a long time. His LDL is 42. I feel this is not too low. He is inclined to continue the simvastatin dose unless I disagree. In his case, I have no recent disagree with continuing the current dose.

## 2014-07-09 NOTE — Assessment & Plan Note (Signed)
Blood pressure is controlled. It is on the low side. Because he has bradycardia his atenolol dose will be cut back.

## 2014-07-09 NOTE — Patient Instructions (Signed)
Your physician recommends that you continue on your current medications as directed. Please refer to the Current Medication list given to you today.  Your physician wants you to follow-up in: 1 year. You will receive a reminder letter in the mail two months in advance. If you don't receive a letter, please call our office to schedule the follow-up appointment.  

## 2014-07-09 NOTE — Assessment & Plan Note (Signed)
He has resting sinus bradycardia. Atenolol dose will be decreased.

## 2014-07-09 NOTE — Progress Notes (Signed)
Patient ID: Carlos [name], male   DOB: Mar 01, 1941, 73 y.o.   MRN: [id number]    HPI  patient is seen today to follow-up a history of sinus bradycardia and atrial flutter and right bundle branch block. He has been very stable.  Historically the patient is seen every 6 months in our office. I see him alternating with Dr. Lovena Le for his bradycardia and history of atrial flutter. We have decided to follow him medically over time and he's been quite stable. He saw Dr. Lovena Le in  May, 2015.the plan is to continue to follow him.  He works actively. 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 He is quite stable. He does have resting sinus bradycardia.  No Known Allergies  Current Outpatient Prescriptions  Medication Sig Dispense Refill  . amoxicillin (AMOXIL) 500 MG capsule 4 tabs 1 hr prior to dental procedures.     Marland Kitchen aspirin 81 MG EC tablet Take 81 mg by mouth daily.      Marland Kitchen atenolol (TENORMIN) 50 MG tablet TAKE 1 TABLET BY MOUTH EVERY DAY 90 tablet 1  . felodipine (PLENDIL) 10 MG 24 hr tablet Take 1 tablet (10 mg total) by mouth daily. 90 tablet 3  . fish oil-omega-3 fatty acids 1000 MG capsule Take 1 g by mouth daily.      Marland Kitchen losartan-hydrochlorothiazide (HYZAAR) 100-12.5 MG per tablet TAKE 1 TABLET BY MOUTH EVERY DAY 90 tablet 0  . Multiple Vitamin (MULTIVITAMIN) tablet Take 1 tablet by mouth daily.      . simvastatin (ZOCOR) 40 MG tablet Take 1 tablet (40 mg total) by mouth at bedtime. 90 tablet 3   No current facility-administered medications for this visit.    History   Social History  . Marital Status: Married    Spouse Name: Cornelius Marullo    Number of Children: 2  . Years of Education: N/A   Occupational History  . Not on file.   Social History Main Topics  . Smoking status: Never Smoker   . Smokeless tobacco: Never Used  . Alcohol Use: 1.8 oz/week    3 Glasses of wine per week     Comment: 3 glasses of wine per week  . Drug Use: No  . Sexual  Activity: Not on file   Other Topics Concern  . Not on file   Social History Narrative    Family History  Problem Relation Age of Onset  . Hypertension Mother     Past Medical History  Diagnosis Date  . Allergic rhinitis   . HTN (hypertension)   . Aortic insufficiency     mild... echo... 02/2010  . Mitral regurgitation     mild... echo.. 02/2010  . LVH (left ventricular hypertrophy)     moderate... echo... 02/2010  . Diastolic dysfunction     EF 50-55%... echo.. 02/2010 (55-60% echo 2009)  . Incomplete RBBB     IRBBB; sinus bradycardia w/ PVCs  . Sinus bradycardia   . PVC (premature ventricular contraction)   . Atrial flutter 02/03/2010    Consult by Dr. Lovena Le... June, 2011.... plan rate control...  Coumadin not needed... ablation if rate not controlled well  . Dyslipidemia   . Hemorrhoids   . Renal calculus   . Degenerative joint disease   . Anxiety   . Basal cell carcinoma   . Ejection fraction     EF 50-55%, echo, June, 2011  . Chest pain     Nuclear,  March, 2010, no ischemia    Past Surgical History  Procedure Laterality Date  . Kidney stone surgery      Patient Active Problem List   Diagnosis Date Noted  . Chest pain     Priority: High  . Ejection fraction     Priority: High  . Diastolic dysfunction     Priority: High  . Dyslipidemia     Priority: High  . Hx of colonic polyps 01/13/2013  . Incomplete RBBB   . Essential hypertension   . Mitral regurgitation   . LVH (left ventricular hypertrophy)   . Sinus bradycardia   . PVC (premature ventricular contraction)   . Atrial flutter 02/03/2010  . ALLERGIC RHINITIS 12/28/2009  . BASAL CELL CARCINOMA, HX OF 10/26/2008  . ANXIETY 10/21/2007  . HEMORRHOIDS 10/21/2007  . DEGENERATIVE JOINT DISEASE 10/21/2007  . RENAL CALCULUS, HX OF 10/20/2007    ROS   patient denies fever, chills, headache, sweats, rash, change in vision, change in hearing, chest pain, cough, nausea or vomiting, urinary symptoms. All  other systems are reviewed and are negative.  PHYSICAL EXAM  patient is oriented to person time and place. Affect is normal. He is here with his wife. Head is emphatic. Sclera and conjunctiva are normal. There is no jugulovenous distention. Lungs are clear. Respiratory effort is not labored. Cardiac exam reveals an S1 and S2. Abdomen is soft. There is no peripheral edema.  Filed Vitals:   07/09/14 1451  BP: 108/60  Pulse: 50  Height: 6' (1.829 m)  Weight: 179 lb (81.194 kg)   I reviewed his EKG from May, 2015. He has sinus bradycardia and right bundle branch block.  ASSESSMENT & PLAN

## 2014-07-19 ENCOUNTER — Ambulatory Visit (INDEPENDENT_AMBULATORY_CARE_PROVIDER_SITE_OTHER): Payer: Medicare Other | Admitting: Pulmonary Disease

## 2014-07-19 VITALS — BP 102/62 | HR 45 | Temp 97.4°F | Ht 72.0 in | Wt 181.8 lb

## 2014-07-19 DIAGNOSIS — M159 Polyosteoarthritis, unspecified: Secondary | ICD-10-CM

## 2014-07-19 DIAGNOSIS — E785 Hyperlipidemia, unspecified: Secondary | ICD-10-CM

## 2014-07-19 DIAGNOSIS — I450 Right fascicular block: Secondary | ICD-10-CM

## 2014-07-19 DIAGNOSIS — Z23 Encounter for immunization: Secondary | ICD-10-CM

## 2014-07-19 DIAGNOSIS — I340 Nonrheumatic mitral (valve) insufficiency: Secondary | ICD-10-CM

## 2014-07-19 DIAGNOSIS — I4510 Unspecified right bundle-branch block: Secondary | ICD-10-CM

## 2014-07-19 DIAGNOSIS — Z86010 Personal history of colonic polyps: Secondary | ICD-10-CM

## 2014-07-19 DIAGNOSIS — M150 Primary generalized (osteo)arthritis: Secondary | ICD-10-CM

## 2014-07-19 DIAGNOSIS — Z87442 Personal history of urinary calculi: Secondary | ICD-10-CM

## 2014-07-19 DIAGNOSIS — I10 Essential (primary) hypertension: Secondary | ICD-10-CM

## 2014-07-19 DIAGNOSIS — I4892 Unspecified atrial flutter: Secondary | ICD-10-CM

## 2014-07-19 NOTE — Patient Instructions (Signed)
Today we updated your med list in our EPIC system...    We decided to DECREASE your HYZAAR dose to 1/2 tab daily...    Watch your BP at home & call for any questions...  We gave you the PREVNAR-13 Pneumonia shot today ("one and done")...  Keep up the good work w/ your diet & exercise...  Let's plan a follow up visit in 64mo, sooner if needed for problems.Marland KitchenMarland Kitchen

## 2014-07-20 ENCOUNTER — Encounter: Payer: Self-pay | Admitting: Pulmonary Disease

## 2014-07-20 NOTE — Progress Notes (Signed)
Subjective:    Patient ID: Carlos [name], male    DOB: 1941-02-12, 73 y.o.   MRN: [id number]  HPI 73 y/o WM here for a follow up visit... he has multiple medical problems as noted below...    ~  July 04, 2012:  3mo ROV & Carlos [name] continues to do well- no new complaints or concerns, he requests Epi-pen for fire ant exposure at work... 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    HBP> on Aten50, Plendil10, Hyzaar100-12.5;  BP= 112/82 & denies CP, palpit, dizzy, SOB, edema, etc...    Valv Heart Dis> on Amoxicillin Prn for SBE prophy...    AFlutter, RBBB> on ASA81; denies CP, palpit, dizzy, etc; he had f/u [name] 5/13- doing well & no need for pacer...    Chol> on Simva40, FishOil; on diet & wt stable- FLP 5/13 showed TChol 112, TG 39, HDL 55, LDL 49    GI- polyps, hems> he remains asymptomatic w/o abd pain, n/v, c/d, blood seen...    Kidney stones> he remains asymptomatic w/o pain, dysuria, blood, etc...    DJD> on OTC analgesics as needed; he continues to work hard, min arthritic symptoms, using OTC meds as needed...    Anxiety> doing satis, not on meds...    Skin cancer> he continues to f/u w/ Derm Q7mo... We reviewed prob list, meds, xrays and labs> see below for updates >> ok Flu shot today...  ~  Jan 13, 2013:  50mo ROV & Carlos [name] has had a good interval w/o new complaints or concerns... 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    HBP> on Aten50, Plendil10, Hyzaar100-12.5;  BP= 118/58 & denies CP, palpit, dizzy, SOB, edema, etc...    Valv Heart Dis> on Amoxicillin Prn for SBE prophy; he had f/u 11/13 [name]- PAFlutter, mild AI&MR, doing satis/ no change-same meds...    AFlutter, RBBB> on ASA81; denies CP, palpit, dizzy, etc; he had f/u [name] 5/13- doing well & no need for pacer...    Chol> on Simva40, FishOil; on  diet & wt stable- FLP 7/14 showed TChol 113, TG 42, HDL 52, LDL 53    GI- polyps, hems> he remains asymptomatic w/o abd pain, n/v, c/d, blood seen; last colon 12/10 w/ divertics, 3mm adenoma, & f/u 64yrs...    Kidney stones> he remains asymptomatic w/o pain, dysuria, blood, etc...    DJD> on OTC analgesics as needed; he continues to work hard, min arthritic symptoms, using OTC meds as needed...    Anxiety> doing satis, not on meds...    Skin cancer> he continues to f/u w/ Derm Q2mo... We reviewed prob list, meds, xrays and labs> see below for updates >>   CXR 5/14 showed heart at upper lim of norm, mild atherosclerotic changes in Ao, bilat nipple shadows, NAD...  LABS 5/14:  FLP- at goals on simva40;  Chems- wnl;  CBC- wnl x plat=130K;  TSH=1.04;  PSA=1.70...  ~  July 15, 2013:  85mo ROV & Carlos [name] tells me he had a left kidney stone 7/14, went to ER & was able to pass it, no prob since then; recall hx of kid stone in 1970's that required surgery (this has been his only recurrence since then); advised lots of fluids, avoid cokes/ tea/ etc; his renal funct is normal  w/ cr~1...     He remains on ASA81, Aten50, Plendil10, Hyzaar100-12.5 and BP is well controlled at 110/66 today; he feels well w/o CP, palpit, dizzy, SOB, edema, etc;  He has Hx PAFlutter, RBBB, and mild AI/ MR followed by Benay Spice- stable...    Chol treated w/ diet & Simva40; last FLP 5/14 at goals and stabler yr to yr...     GI is stable and up to date... We reviewed prob list, meds, xrays and labs> see below for updates >> he had the 2014 flu vaccine in Oct...  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    HBP> on Aten50, Plendil10, Hyzaar100-12.5;  BP= 118/64 & denies  CP, palpit, dizzy, SOB, edema, etc...    Valv Heart Dis> on Amoxicillin Prn for SBE prophy; he had f/u 5/15 [name]- PAFlutter, mild AI&MR, doing satis/ no change-same meds...    AFlutter, RBBB> on ASA81; denies CP, palpit, dizzy, etc; he had f/u [name] 5/13- doing well & no need for pacer...    Chol> on Simva40, FishOil; on diet & wt stable- FLP 5/15 showed TChol 97, TG 20, HDL 51, LDL 42    GI- polyps, hems> he remains asymptomatic w/o abd pain, n/v, c/d, blood seen; last colon 12/10 w/ divertics, 24mm adenoma, & f/u 17yrs...    Kidney stones> he remains asymptomatic w/o pain, dysuria, blood, etc...    DJD> on OTC analgesics as needed; he continues to work hard, min arthritic symptoms, using OTC meds as needed...    Anxiety> doing satis, not on meds...    Skin cancer> he continues to f/u w/ Derm Q66mo... We reviewed prob list, meds, xrays and labs> see below for updates >>  EKG 5/15 showed SBrady, rate46, RBBB, NAD... LABS 5/15:  FLP- at goals on Simva40;  Chems- wnl;  CBC- wnl;  TSH=0.85;  PSA=1.88  ~  July 20, 2014:  92mo ROV & Carlos [name] continues to do well- no new complaints or concerns;  He saw [name] several weeks ago- Hx SBrady & Aflutter, RBBB; doing well w/o symptoms but BP on the low end so they cut back on his Atenolol50 to 1/2 tab daily...     BP remains on low side w/ Aten50-1/2, Felodip10, Hyzaar100-12.5; BP= 102/62 today & we decided to decr the Hyzaar to 1/2 tab daily; he will monitor BP at home...    He remains on Simva40 + FishOil and last FLP 5/15 looked great, continue same...     ROS is otherw neg- he continues f/u w/ Derm every 3-70mo... We reviewed prob list, meds, xrays and labs> see below for updates >> He had the 2015 Flu vaccine already; given PREVNAR-13 today...           Problem List:  HYPERTENSION (ICD-401.9) - controlled on ATENOLOL 50mg /d,  FELODIPINE 10mg /d, & LOSARTAN/HCT 100-12.5 daily... ~  see 2DEcho below> ~  CXR 4/11 showed borderline cardiomeg, clear  lungs, DJD spine, NAD;  CXR 4/12 same> NAD, WNL... ~  10/11:   BP= 110/66 & similar at home; pt denies HA, fatigue, visual changes, CP, palipit, dizziness, syncope, dyspnea, edema, etc... ~  4/12:  BP= 110/84 & he remains asymptomatic... We decided to decr the  ATENOLOL from 50mg  to 25mg /d due to his bradycardia... ~  CXR 4/12 showed normal heart size, clear lungs, NAD... ~  10/12:  BP= 120/72, Pulse= 50 & regular; he went back on his Aten50/d; he remains asymptomatic w/o CP, palpit, dizzy, syncope, SOB, edema, etc... ~  5/13:  BP= 122/80 & he remains bradycardic but doesn't want to cut the BBlocker; denies CP, palpit, dizzy, syncope, SOB, etc. ~  11/13: on Aten50, Plendil10, Hyzaar100-12.5;  BP= 112/82 & denies CP, palpit, dizzy, SOB, edema, etc... ~  5/14: on Aten50, Plendil10, Hyzaar100-12.5;  BP= 118/58 & denies CP, palpit, dizzy, SOB, edema, etc ~  CXR 5/14 showed heart at upper lim of norm, mild atherosclerotic changes in Ao, bilat nipple shadows, NAD ~  11/14: He remains on ASA81, Aten50, Plendil10, Hyzaar100-12.5 and BP is well controlled at 110/66 today; he feels well w/o CP, palpit, dizzy, SOB, edema, etc. ~  5/15: on Aten50, Plendil10, Hyzaar100-12.5;  BP= 118/64 & denies CP, palpit, dizzy, SOB, edema, etc ~  11/15: on Aten50-1/2, Felodip10, Hyzaar100-12.5; BP= 102/62 today & we decided to decr the Hyzaar to 1/2 tab daily; he will monitor BP at home  [place] (ICD-424.90) - followed by [name]/Taylor- pt knows to take AMOX prior to dental work...  ~  2DEcho in 2002 w/ mild AI, MR, TR and normal LVF... ~  2DEcho 2/09 showed LV sl dilated w/ mild DD, norm LVF w/o regional wall motion abn & EF= 55-60%;  mild AI, mild-mod MR, dil LA @ 99mm... ~  NuclearStressTest 3/10 showed mild inferobas thinning, no ischemia, EF=48% w/ mild global HK... ~  2DEcho 4/10 showed mild AI & MR, mod dil LA, norm LVF w/ EF= 55-60% (Myoview= false result). ~  2DEcho 6/11 showed mod conc LVH, norm wall  motion & EF= 50-55%, mild AI, mild thick MV leaflets w/ mild MR, mod LAdil= 46mm. ~  10/12 & 11/13: he saw [facility] for Cards f/u> doing satis, not on anticoagulation, no changes made... ~   EKG 5/15 showed SBrady, rate46, RBBB, NAD...  ~  11/15: he had f/u [name]- doing satis, asymptomatic but BP was low at 108/60 so they decr the aten50 to 1/2 tab daily...  ATRIAL FLUTTER (ICD-427.32) & BUNDLE BRANCH BLOCK, RIGHT (ICD-426.4) - on ASA 325mg /d and rate control strategy...  ~  prev EKG's w/ IRBBB...2/09 w/ IVCD, NSR, & QS wave in III and aVF (new)... check LVF (nl) and wall motion (nl) on echo... ~  he developed AFlutter 2011 & had evals from [name] 7/11 & [name] 10/11-  they rec rate control & ASA, w/ consideration of cath ablation if not controlled... ~  2DEcho 6/11 showed mod conc LVH, norm wall motion & EF= 50-55%, mild AI, mild thick MV leaflets w/ mild MR, mod LAdil= 51mm;   ~  Holter 6/11 revealed AFlutter (ave rate 106, max=145, no pauses)... ~  5/13: he saw [name]- doing well, no symptoms, no indication for pacer...  ~  EKG 11/13 showed sinus brady, rate48, rsr', otherw wnl... ~   EKG 5/15 showed SBrady, rate46, RBBB, NAD...    HYPERCHOLESTEROLEMIA (ICD-272.0) - on SIMVASTATIN 40mg /d, Fish Oil, & diet...  ~  [place] 1/08 showed TChol 106, TG 59, HDL 48, LDL46 ~  FLP 2/09 showed TChol 123, TG 57, HDL 51, LDL 61 ~  FLP 2/10 showed TChol 117, TG 47, HDL 55, LDL 53 ~  FLP 4/11 showed TChol 105, TG 27, HDL 57, LDL 43 ~  FLP 4/12 on  Simva40 showed TChol 104, Tg 34, HDL 51, LDL 46 ~  FLP 5/13 on Simva40 showed TChol 112, TG 39, HDL 55, LDL 49 ~  FLP 5/14 on Simva40 showed TChol 113, TG 42, HDL 52, LDL 53 ~  FLP 5/15 on Simva40 showed  TChol 97, TG 20, HDL 51, LDL 42  Hx of HEMORRHOIDS (ICD-455.6) - last colonoscopy in [place] 3/05 by DrNichols was neg x hems... rec f/u 5 yrs due to fam history & he asks to be seen here (refer to GI)... ~  12/10: had f/u colonoscopy by DrStark- 62mm sessile  polyp in cecum= path showed fragments of tubular adenoma; +mild divertics...   RENAL CALCULUS, HX OF (ICD-V13.01) - surg in 1974 for kid stone...  ~  7/14:  CT Abd & Pelvis showed enhancement of the left kidney w/ marked perineph stranding, mod left hydroureteronephrosis, 36mm left UVJ stone, tortuous left ureter, enlarged prostatelumbar DJD...  DEGENERATIVE JOINT DISEASE (ICD-715.90) - occas neck discomfort without arm pain, numbness, etc... he is very active...  ANXIETY (ICD-300.00)  BASAL CELL CARCINOMA, HX OF (ICD-V10.83) - he follow up w/ Derm in [place] every 3-24months. ~  8/12: note from DrWHoover- full body check & several AKs removed... ~  He continues to f/u w/ Derm every 33mo he says...  Health Maintenance: ~  GI:  DrStark w/ colon 12/10 as above... ~  GU:  4/11> DRE= neg & PSA= 1.50 ~  Immunizations:  he gets yearly Flu shots,  ?Pneumovax,  Given TDAP 5/13...   Past Surgical History  Procedure Laterality Date  . Kidney stone surgery      Outpatient Encounter Prescriptions as of 07/19/2014  Medication Sig  . amoxicillin (AMOXIL) 500 MG capsule 4 tabs 1 hr prior to dental procedures.   Marland Kitchen aspirin 81 MG EC tablet Take 81 mg by mouth daily.    Marland Kitchen atenolol (TENORMIN) 50 MG tablet TAKE 1 TABLET BY MOUTH EVERY DAY (Patient taking differently: TAKE 1/2  TABLET BY MOUTH EVERY DAY)  . felodipine (PLENDIL) 10 MG 24 hr tablet Take 1 tablet (10 mg total) by mouth daily.  . fish oil-omega-3 fatty acids 1000 MG capsule Take 1 g by mouth daily.    Marland Kitchen losartan-hydrochlorothiazide (HYZAAR) 100-12.5 MG per tablet TAKE 1 TABLET BY MOUTH EVERY DAY (Patient taking differently: TAKE 1/2  TABLET BY MOUTH EVERY DAY)  . Multiple Vitamin (MULTIVITAMIN) tablet Take 1 tablet by mouth daily.    . simvastatin (ZOCOR) 40 MG tablet Take 1 tablet (40 mg total) by mouth at bedtime.    No Known Allergies   Current Medications, Allergies, Past Medical History, Past Surgical History, Family History, and  Social History were reviewed in [employer] record.    Review of Systems         See HPI - all other systems neg except as noted... The patient denies anorexia, fever, weight loss, weight gain, vision loss, decreased hearing, hoarseness, chest pain, syncope, dyspnea on exertion, peripheral edema, prolonged cough, headaches, hemoptysis, abdominal pain, melena, hematochezia, severe indigestion/heartburn, hematuria, incontinence, muscle weakness, suspicious skin lesions, transient blindness, difficulty walking, depression, unusual weight change, abnormal bleeding, enlarged lymph nodes, and angioedema.    Objective:   Physical Exam    WD, WN, 73 y/o WM in NAD... GENERAL:  Alert & oriented; pleasant & cooperative... HEENT:  [place]/AT, EOM-wnl, PERRLA, Fundi-benign, EACs-clear, TMs-wnl, NOSE-clear, THROAT-clear & wnl. NECK:  Supple w/ fairROM; no JVD; normal carotid impulses w/o bruits; no thyromegaly or nodules palpated;  no lymphadenopathy. CHEST:  Clear to P & A; without wheezes/ rales/ or rhonchi. HEART:  Regular Rhythm;  gr1/6 SEM LSB without rubs or gallops heard... ABDOMEN:  Soft & nontender; normal bowel sounds; no organomegaly or masses detected. (RECTAL:  Neg - prostate 2+ & nontender w/o nodules; stool hematest neg) EXT: without deformities or arthritic changes; no varicose veins/ venous insuffic/ or edema. NEURO:  CN's intact; motor testing normal; sensory testing normal; gait normal & balance OK. DERM:  No lesions noted; no rash etc...  RADIOLOGY DATA:  Reviewed in the EPIC EMR & discussed w/ the patient...  LABORATORY DATA:  Reviewed in the EPIC EMR & discussed w/ the patient...    Assessment & Plan:    HBP>  BP remains on the low side despite recent decr Aten50 to 1/2 tab/d; REC to decr the Hyzaar to 1/2 tab daily...   Hx AFlutter, RBBB, Valv heart dis>  Followed by [name] & [name]; stable, continue same Rx...  CHOL>  Stable on diet + Simva40; continue  same...  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  Other medical issues as noted...    Patient's Medications  New Prescriptions   No medications on file  Previous Medications   AMOXICILLIN (AMOXIL) 500 MG CAPSULE    4 tabs 1 hr prior to dental procedures.    ASPIRIN 81 MG EC TABLET    Take 81 mg by mouth daily.     ATENOLOL (TENORMIN) 50 MG TABLET    TAKE 1/2 TABLET BY MOUTH EVERY DAY   FELODIPINE (PLENDIL) 10 MG 24 HR TABLET    Take 1 tablet (10 mg total) by mouth daily.   FISH OIL-OMEGA-3 FATTY ACIDS 1000 MG CAPSULE    Take 1 g by mouth daily.     LOSARTAN-HYDROCHLOROTHIAZIDE (HYZAAR) 100-12.5 MG PER TABLET    TAKE 1/2 TABLET BY MOUTH EVERY DAY   MULTIPLE VITAMIN (MULTIVITAMIN) TABLET    Take 1 tablet by mouth daily.     SIMVASTATIN (ZOCOR) 40 MG TABLET    Take 1 tablet (40 mg total) by mouth at bedtime.  Modified Medications   No medications on file  Discontinued Medications   No medications on file

## 2014-07-21 ENCOUNTER — Ambulatory Visit: Payer: Medicare Other | Admitting: Pulmonary Disease

## 2014-08-06 ENCOUNTER — Ambulatory Visit (AMBULATORY_SURGERY_CENTER): Payer: Self-pay | Admitting: *Deleted

## 2014-08-06 VITALS — Ht 73.0 in | Wt 182.0 lb

## 2014-08-06 DIAGNOSIS — Z860100 Personal history of colon polyps, unspecified: Secondary | ICD-10-CM

## 2014-08-06 MED ORDER — MOVIPREP 100 G PO SOLR
1.0000 | Freq: Once | ORAL | Status: DC
Start: 2014-08-06 — End: 2014-08-20

## 2014-08-06 NOTE — Progress Notes (Signed)
No egg or soy allergy. ewm No issues with past sedation. ewm No diet pills. ewm no home 02 use. ewm

## 2014-08-20 ENCOUNTER — Encounter: Payer: Self-pay | Admitting: Gastroenterology

## 2014-08-20 ENCOUNTER — Ambulatory Visit (AMBULATORY_SURGERY_CENTER): Payer: Medicare Other | Admitting: Gastroenterology

## 2014-08-20 VITALS — BP 131/70 | HR 55 | Temp 96.9°F | Resp 42 | Ht 73.0 in | Wt 182.0 lb

## 2014-08-20 DIAGNOSIS — D122 Benign neoplasm of ascending colon: Secondary | ICD-10-CM

## 2014-08-20 DIAGNOSIS — Z860100 Personal history of colon polyps, unspecified: Secondary | ICD-10-CM

## 2014-08-20 MED ORDER — SODIUM CHLORIDE 0.9 % IV SOLN: 500.0000 mL | INTRAVENOUS | Status: DC

## 2014-08-20 NOTE — Op Note (Signed)
[facility]  [address] [place], [id number]   COLONOSCOPY PROCEDURE REPORT  PATIENT: Carlos [name], Carlos [name]  MR#: [id number] BIRTHDATE: 01-Oct-1940 , 73  yrs. old GENDER: male ENDOSCOPIST: Ladene Artist, MD, [facility] [R PROCEDURE DATE:  08/20/2014 PROCEDURE:   Colonoscopy with biopsy First Screening Colonoscopy - Avg.  risk and is 50 yrs.  old or older - No.  Prior Negative Screening - Now for repeat screening. N/A  History of Adenoma - Now for follow-up colonoscopy & has been > or = to 3 yrs.  Yes hx of adenoma.  Has been 3 or more years since last colonoscopy.  Polyps Removed Today? Yes. ASA CLASS:   Class III INDICATIONS:surveillance colonoscopy based on a history of adenomatous colonic polyp(s). MEDICATIONS: Monitored anesthesia care and Propofol 200 mg IV DESCRIPTION OF PROCEDURE:   After the risks benefits and alternatives of the procedure were thoroughly explained, informed consent was obtained.  The digital rectal exam revealed no abnormalities of the rectum.   The LB [id number]  endoscope was introduced through the anus and advanced to the cecum, which was identified by both the appendix and ileocecal valve. No adverse events experienced.   The quality of the prep was excellent, using MoviPrep  The instrument was then slowly withdrawn as the colon was fully examined.    COLON FINDINGS: A sessile polyp measuring 5 mm in size was found in the ascending colon.  A polypectomy was performed with cold forceps.  The resection was complete, the polyp tissue was completely retrieved and sent to histology.   There was mild diverticulosis noted in the sigmoid colon.   The examination was otherwise normal.  Retroflexed views revealed internal Grade I hemorrhoids. The time to cecum=1 minutes 51 seconds.  Withdrawal time=10 minutes 22 seconds.  The scope was withdrawn and the procedure completed. COMPLICATIONS: There were no immediate complications.  ENDOSCOPIC  IMPRESSION: 1.   Sessile polyp in the ascending colon; polypectomy performed with cold forceps 2.   Mild diverticulosis in the sigmoid colon 3.   Grade l internal hemorrhoids  RECOMMENDATIONS: 1.  Await pathology results 2.  High fiber diet with liberal fluid intake. 3.  Repeat Colonoscopy in 5 years.  eSigned:  Ladene Artist, MD, [facility] 08/20/2014 9:29 AM

## 2014-08-20 NOTE — Patient Instructions (Signed)
YOU HAD AN ENDOSCOPIC PROCEDURE TODAY AT [facility]: Refer to the procedure report that was given to you for any specific questions about what was found during the examination.  If the procedure report does not answer your questions, please call your gastroenterologist to clarify.  If you requested that your care partner not be given the details of your procedure findings, then the procedure report has been included in a sealed envelope for you to review at your convenience later.  YOU SHOULD EXPECT: Some feelings of bloating in the abdomen. Passage of more gas than usual.  Walking can help get rid of the air that was put into your GI tract during the procedure and reduce the bloating. If you had a lower endoscopy (such as a colonoscopy or flexible sigmoidoscopy) you may notice spotting of blood in your stool or on the toilet paper. If you underwent a bowel prep for your procedure, then you may not have a normal bowel movement for a few days.  DIET: Your first meal following the procedure should be a light meal and then it is ok to progress to your normal diet.  A half-sandwich or bowl of soup is an example of a good first meal.  Heavy or fried foods are harder to digest and may make you feel nauseous or bloated.  Likewise meals heavy in dairy and vegetables can cause extra gas to form and this can also increase the bloating.  Drink plenty of fluids but you should avoid alcoholic beverages for 24 hours. Try to increase the fiber in your diet.  ACTIVITY: Your care partner should take you home directly after the procedure.  You should plan to take it easy, moving slowly for the rest of the day.  You can resume normal activity the day after the procedure however you should NOT DRIVE or use heavy machinery for 24 hours (because of the sedation medicines used during the test).    SYMPTOMS TO REPORT IMMEDIATELY: A gastroenterologist can be reached at any hour.  During normal business hours, 8:30  AM to 5:00 PM Monday through Friday, call [phone].  After hours and on weekends, please call the GI answering service at [phone] who will take a message and have the physician on call contact you.   Following lower endoscopy (colonoscopy or flexible sigmoidoscopy):  Excessive amounts of blood in the stool  Significant tenderness or worsening of abdominal pains  Swelling of the abdomen that is new, acute  Fever of 100F or higher  FOLLOW UP: If any biopsies were taken you will be contacted by phone or by letter within the next 1-3 weeks.  Call your gastroenterologist if you have not heard about the biopsies in 3 weeks.  Our staff will call the home number listed on your records the next business day following your procedure to check on you and address any questions or concerns that you may have at that time regarding the information given to you following your procedure. This is a courtesy call and so if there is no answer at the home number and we have not heard from you through the emergency physician on call, we will assume that you have returned to your regular daily activities without incident.  SIGNATURES/CONFIDENTIALITY: You and/or your care partner have signed paperwork which will be entered into your electronic medical record.  These signatures attest to the fact that that the information above on your After Visit Summary has been reviewed and is understood.  Full responsibility of the confidentiality of this discharge information lies with you and/or your care-partner.  Read all of the handouts given to you by your recovery room nurse.   Repeat colonoscopy in 5 years.

## 2014-08-20 NOTE — Progress Notes (Signed)
Called to room to assist during endoscopic procedure.  Patient ID and intended procedure confirmed with present staff. Received instructions for my participation in the procedure from the performing physician.  

## 2014-08-20 NOTE — Progress Notes (Signed)
Report to PACU, RN, vss, BBS= Clear.  

## 2014-08-23 ENCOUNTER — Telehealth: Payer: Self-pay

## 2014-08-23 NOTE — Telephone Encounter (Signed)
Left message on answering machine. 

## 2014-08-30 ENCOUNTER — Encounter: Payer: Self-pay | Admitting: Gastroenterology

## 2014-09-09 DIAGNOSIS — Z85828 Personal history of other malignant neoplasm of skin: Secondary | ICD-10-CM | POA: Diagnosis not present

## 2014-09-09 DIAGNOSIS — Z08 Encounter for follow-up examination after completed treatment for malignant neoplasm: Secondary | ICD-10-CM | POA: Diagnosis not present

## 2014-09-09 DIAGNOSIS — L570 Actinic keratosis: Secondary | ICD-10-CM | POA: Diagnosis not present

## 2014-10-02 ENCOUNTER — Other Ambulatory Visit: Payer: Self-pay | Admitting: Pulmonary Disease

## 2014-10-04 ENCOUNTER — Telehealth: Payer: Self-pay | Admitting: Pulmonary Disease

## 2014-10-04 MED ORDER — ATENOLOL 50 MG PO TABS: 50.0000 mg | ORAL_TABLET | Freq: Every day | ORAL | Status: DC

## 2014-10-04 NOTE — Telephone Encounter (Signed)
Rx has been sent in. Pt is aware. 

## 2014-12-16 DIAGNOSIS — Z85828 Personal history of other malignant neoplasm of skin: Secondary | ICD-10-CM | POA: Diagnosis not present

## 2014-12-16 DIAGNOSIS — Z08 Encounter for follow-up examination after completed treatment for malignant neoplasm: Secondary | ICD-10-CM | POA: Diagnosis not present

## 2015-01-17 ENCOUNTER — Ambulatory Visit (INDEPENDENT_AMBULATORY_CARE_PROVIDER_SITE_OTHER)
Admission: RE | Admit: 2015-01-17 | Discharge: 2015-01-17 | Disposition: A | Discharge status: Home / Self Care | Payer: Medicare Other | Source: Ambulatory Visit | Attending: Pulmonary Disease | Admitting: Pulmonary Disease

## 2015-01-17 ENCOUNTER — Encounter (INDEPENDENT_AMBULATORY_CARE_PROVIDER_SITE_OTHER): Payer: Self-pay

## 2015-01-17 ENCOUNTER — Other Ambulatory Visit (INDEPENDENT_AMBULATORY_CARE_PROVIDER_SITE_OTHER): Payer: Medicare Other

## 2015-01-17 ENCOUNTER — Ambulatory Visit (INDEPENDENT_AMBULATORY_CARE_PROVIDER_SITE_OTHER): Payer: Medicare Other | Admitting: Pulmonary Disease

## 2015-01-17 ENCOUNTER — Encounter: Payer: Self-pay | Admitting: Pulmonary Disease

## 2015-01-17 VITALS — BP 104/60 | HR 50 | Temp 97.0°F | Wt 179.0 lb

## 2015-01-17 DIAGNOSIS — I4892 Unspecified atrial flutter: Secondary | ICD-10-CM | POA: Diagnosis not present

## 2015-01-17 DIAGNOSIS — I340 Nonrheumatic mitral (valve) insufficiency: Secondary | ICD-10-CM

## 2015-01-17 DIAGNOSIS — Z86010 Personal history of colonic polyps: Secondary | ICD-10-CM

## 2015-01-17 DIAGNOSIS — I4510 Unspecified right bundle-branch block: Secondary | ICD-10-CM

## 2015-01-17 DIAGNOSIS — I450 Right fascicular block: Secondary | ICD-10-CM

## 2015-01-17 DIAGNOSIS — M159 Polyosteoarthritis, unspecified: Secondary | ICD-10-CM

## 2015-01-17 DIAGNOSIS — Z87442 Personal history of urinary calculi: Secondary | ICD-10-CM

## 2015-01-17 DIAGNOSIS — E785 Hyperlipidemia, unspecified: Secondary | ICD-10-CM

## 2015-01-17 DIAGNOSIS — M150 Primary generalized (osteo)arthritis: Secondary | ICD-10-CM

## 2015-01-17 DIAGNOSIS — I10 Essential (primary) hypertension: Secondary | ICD-10-CM | POA: Diagnosis not present

## 2015-01-17 DIAGNOSIS — J449 Chronic obstructive pulmonary disease, unspecified: Secondary | ICD-10-CM | POA: Diagnosis not present

## 2015-01-17 LAB — CBC WITH DIFFERENTIAL/PLATELET
BASOS PCT: 0.8 % (ref 0.0–3.0)
Basophils Absolute: 0 10*3/uL (ref 0.0–0.1)
EOS PCT: 1.9 % (ref 0.0–5.0)
Eosinophils Absolute: 0.1 10*3/uL (ref 0.0–0.7)
HCT: 49.4 % (ref 39.0–52.0)
Hemoglobin: 16.7 g/dL (ref 13.0–17.0)
LYMPHS PCT: 28.5 % (ref 12.0–46.0)
Lymphs Abs: 1.8 10*3/uL (ref 0.7–4.0)
MCHC: 33.8 g/dL (ref 30.0–36.0)
MCV: 89.4 fl (ref 78.0–100.0)
MONO ABS: 0.5 10*3/uL (ref 0.1–1.0)
Monocytes Relative: 8.3 % (ref 3.0–12.0)
NEUTROS ABS: 3.8 10*3/uL (ref 1.4–7.7)
NEUTROS PCT: 60.5 % (ref 43.0–77.0)
Platelets: 140 10*3/uL — ABNORMAL LOW (ref 150.0–400.0)
RBC: 5.52 Mil/uL (ref 4.22–5.81)
RDW: 13.9 % (ref 11.5–15.5)
WBC: 6.3 10*3/uL (ref 4.0–10.5)

## 2015-01-17 LAB — BASIC METABOLIC PANEL
BUN: 11 mg/dL (ref 6–23)
CHLORIDE: 101 meq/L (ref 96–112)
CO2: 31 mEq/L (ref 19–32)
CREATININE: 0.92 mg/dL (ref 0.40–1.50)
Calcium: 10.2 mg/dL (ref 8.4–10.5)
GFR: 85.33 mL/min (ref >60.00)
GLUCOSE: 121 mg/dL — AB (ref 70–99)
POTASSIUM: 4 meq/L (ref 3.5–5.1)
Sodium: 140 mEq/L (ref 135–145)

## 2015-01-17 LAB — LIPID PANEL
CHOL/HDL RATIO: 2
Cholesterol: 116 mg/dL (ref 0–200)
HDL: 65.3 mg/dL (ref >39.00)
LDL CALC: 41 mg/dL (ref 0–99)
NonHDL: 50.7
Triglycerides: 48 mg/dL (ref 0.0–149.0)
VLDL: 9.6 mg/dL (ref 0.0–40.0)

## 2015-01-17 LAB — TSH: TSH: 1.16 u[IU]/mL (ref 0.35–4.50)

## 2015-01-17 LAB — HEPATIC FUNCTION PANEL
ALK PHOS: 71 U/L (ref 39–117)
ALT: 25 U/L (ref 0–53)
AST: 26 U/L (ref 0–37)
Albumin: 4.7 g/dL (ref 3.5–5.2)
Bilirubin, Direct: 0.3 mg/dL (ref 0.0–0.3)
Total Bilirubin: 1 mg/dL (ref 0.2–1.2)
Total Protein: 7.4 g/dL (ref 6.0–8.3)

## 2015-01-17 LAB — PSA: PSA: 2.1 ng/mL (ref 0.10–4.00)

## 2015-01-17 NOTE — Patient Instructions (Signed)
Today we updated your med list in our EPIC system...    Continue your current medications the same...  Today we did your follow up CXR & FASTING blood work...    We will contact you w/ the results when available...   Keep up the good work w/ diet & exercise...  Call for any questions...  Let's plan a follow up visit in 8mo, sooner if needed for problems.Marland KitchenMarland Kitchen

## 2015-01-17 NOTE — Progress Notes (Signed)
Subjective:    Patient ID: Carlos [name], male    DOB: 1941-02-12, 74 y.o.   MRN: [id number]  HPI 74 y/o WM here for a follow up visit... he has multiple medical problems as noted below...    ~  July 04, 2012:  3mo ROV & [name] continues to do well- no new complaints or concerns, he requests Epi-pen for fire ant exposure at work... 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    HBP> on Aten50, Plendil10, Hyzaar100-12.5;  BP= 112/82 & denies CP, palpit, dizzy, SOB, edema, etc...    Valv Heart Dis> on Amoxicillin Prn for SBE prophy...    AFlutter, RBBB> on ASA81; denies CP, palpit, dizzy, etc; he had f/u [name] 5/13- doing well & no need for pacer...    Chol> on Simva40, FishOil; on diet & wt stable- FLP 5/13 showed TChol 112, TG 39, HDL 55, LDL 49    GI- polyps, hems> he remains asymptomatic w/o abd pain, n/v, c/d, blood seen...    Kidney stones> he remains asymptomatic w/o pain, dysuria, blood, etc...    DJD> on OTC analgesics as needed; he continues to work hard, min arthritic symptoms, using OTC meds as needed...    Anxiety> doing satis, not on meds...    Skin cancer> he continues to f/u w/ Derm Q7mo... We reviewed prob list, meds, xrays and labs> see below for updates >> ok Flu shot today...  ~  Jan 13, 2013:  50mo ROV & [name] has had a good interval w/o new complaints or concerns... 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    HBP> on Aten50, Plendil10, Hyzaar100-12.5;  BP= 118/58 & denies CP, palpit, dizzy, SOB, edema, etc...    Valv Heart Dis> on Amoxicillin Prn for SBE prophy; he had f/u 11/13 [name]- PAFlutter, mild AI&MR, doing satis/ no change-same meds...    AFlutter, RBBB> on ASA81; denies CP, palpit, dizzy, etc; he had f/u [name] 5/13- doing well & no need for pacer...    Chol> on Simva40, FishOil; on  diet & wt stable- FLP 7/14 showed TChol 113, TG 42, HDL 52, LDL 53    GI- polyps, hems> he remains asymptomatic w/o abd pain, n/v, c/d, blood seen; last colon 12/10 w/ divertics, 3mm adenoma, & f/u 64yrs...    Kidney stones> he remains asymptomatic w/o pain, dysuria, blood, etc...    DJD> on OTC analgesics as needed; he continues to work hard, min arthritic symptoms, using OTC meds as needed...    Anxiety> doing satis, not on meds...    Skin cancer> he continues to f/u w/ Derm Q2mo... We reviewed prob list, meds, xrays and labs> see below for updates >>   CXR 5/14 showed heart at upper lim of norm, mild atherosclerotic changes in Ao, bilat nipple shadows, NAD...  LABS 5/14:  FLP- at goals on simva40;  Chems- wnl;  CBC- wnl x plat=130K;  TSH=1.04;  PSA=1.70...  ~  July 15, 2013:  85mo ROV & [name] tells me he had a left kidney stone 7/14, went to ER & was able to pass it, no prob since then; recall hx of kid stone in 1970's that required surgery (this has been his only recurrence since then); advised lots of fluids, avoid cokes/ tea/ etc; his renal funct is normal  w/ cr~1...     He remains on ASA81, Aten50, Plendil10, Hyzaar100-12.5 and BP is well controlled at 110/66 today; he feels well w/o CP, palpit, dizzy, SOB, edema, etc;  He has Hx PAFlutter, RBBB, and mild AI/ MR followed by Benay Spice- stable...    Chol treated w/ diet & Simva40; last FLP 5/14 at goals and stabler yr to yr...     GI is stable and up to date... We reviewed prob list, meds, xrays and labs> see below for updates >> he had the 2014 flu vaccine in Oct...  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    HBP> on Aten50, Plendil10, Hyzaar100-12.5;  BP= 118/64 & denies  CP, palpit, dizzy, SOB, edema, etc...    Valv Heart Dis> on Amoxicillin Prn for SBE prophy; he had f/u 5/15 [name]- PAFlutter, mild AI&MR, doing satis/ no change-same meds...    AFlutter, RBBB> on ASA81; denies CP, palpit, dizzy, etc; he had f/u [name] 5/13- doing well & no need for pacer...    Chol> on Simva40, FishOil; on diet & wt stable- FLP 5/15 showed TChol 97, TG 20, HDL 51, LDL 42    GI- polyps, hems> he remains asymptomatic w/o abd pain, n/v, c/d, blood seen; last colon 12/10 w/ divertics, 15mm adenoma, & f/u 40yrs...    Kidney stones> he remains asymptomatic w/o pain, dysuria, blood, etc...    DJD> on OTC analgesics as needed; he continues to work hard, min arthritic symptoms, using OTC meds as needed...    Anxiety> doing satis, not on meds...    Skin cancer> he continues to f/u w/ Derm Q45mo... We reviewed prob list, meds, xrays and labs> see below for updates >>  EKG 5/15 showed SBrady, rate46, RBBB, NAD... LABS 5/15:  FLP- at goals on Simva40;  Chems- wnl;  CBC- wnl;  TSH=0.85;  PSA=1.88  ~  July 20, 2014:  26mo ROV & [name] continues to do well- no new complaints or concerns;  He saw [name] several weeks ago- Hx SBrady & Aflutter, RBBB; doing well w/o symptoms but BP on the low end so they cut back on his Atenolol50 to 1/2 tab daily...     BP remains on low side w/ Aten50-1/2, Felodip10, Hyzaar100-12.5; BP= 102/62 today & we decided to decr the Hyzaar to 1/2 tab daily; he will monitor BP at home...    He remains on Simva40 + FishOil and last FLP 5/15 looked great, continue same...     ROS is otherw neg- he continues f/u w/ Derm every 3-53mo... We reviewed prob list, meds, xrays and labs> see below for updates >> He had the 2015 Flu vaccine already; given PREVNAR-13 today...   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    Valv Heart Dis> on Amoxicillin Prn for SBE prophy; he had f/u 5/15 [name]- PAFlutter, mild AI&MR, doing satis/ no change-same meds...    AFlutter, RBBB> on ASA81; denies CP, palpit, dizzy, etc; he had f/u [name] 5/15- doing well & no need for pacer...    Chol> on Simva40, FishOil; on diet & wt stable- FLP 5/16 showed TChol 116, TG 48, HDL 65, LDL 41; continue same...    GI- polyps, hems> he remains asymptomatic w/o abd pain, n/v, c/d, blood seen; last colon 12/10 w/ divertics, 57mm adenoma, & f/u 20yrs...    Kidney stones> he remains asymptomatic w/o pain, dysuria, blood, etc...    DJD> on OTC analgesics as needed; he continues to work hard, min arthritic symptoms, using OTC meds as needed...    Anxiety> doing satis, not on meds...    Skin cancer> he continues to f/u w/ Derm Q36mo... We reviewed prob list, meds, xrays and labs> see below for updates >>   CXR 5/16 showed norm heart size, clear lungs, NAD...  LABS 5/16:  FLP- at goals on Simva40;  Chems- wnl x BS=121 (diet rx);  CBC- wnl;  TSH=1.16;  PSA=2.10... PLAN>> stable overall, continue same meds & watch BP at home (may need to decr the Plendil if BP on low end)...         Problem List:  HYPERTENSION (ICD-401.9) - controlled on ATENOLOL 50mg /d,  FELODIPINE 10mg /d, & LOSARTAN/HCT 100-12.5 daily... ~  see 2DEcho below> ~  CXR 4/11 showed borderline cardiomeg, clear lungs, DJD spine, NAD;  CXR 4/12 same> NAD, WNL... ~  10/11:   BP= 110/66 & similar at home; pt denies HA, fatigue, visual changes, CP, palipit, dizziness, syncope, dyspnea, edema, etc... ~  4/12:  BP= 110/84 & he remains asymptomatic... We  decided to decr the ATENOLOL from 50mg  to 25mg /d due to his bradycardia... ~  CXR 4/12 showed normal heart size, clear lungs, NAD... ~  10/12:  BP= 120/72, Pulse= 50 & regular; he went back on his Aten50/d; he remains asymptomatic w/o CP, palpit, dizzy, syncope, SOB, edema, etc... ~  5/13:  BP= 122/80 & he remains bradycardic but doesn't want to cut the BBlocker; denies CP, palpit, dizzy, syncope, SOB, etc. ~  11/13: on Aten50, Plendil10, Hyzaar100-12.5;  BP= 112/82 & denies CP, palpit, dizzy, SOB, edema, etc... ~  5/14: on Aten50, Plendil10, Hyzaar100-12.5;  BP= 118/58 & denies CP, palpit, dizzy, SOB, edema, etc ~  CXR 5/14 showed heart at upper lim of norm, mild atherosclerotic changes in Ao, bilat nipple shadows, NAD ~  11/14: He remains on ASA81, Aten50, Plendil10, Hyzaar100-12.5 and BP is well controlled at 110/66 today; he feels well w/o CP, palpit, dizzy, SOB, edema, etc. ~  5/15: on Aten50, Plendil10, Hyzaar100-12.5;  BP= 118/64 & denies CP, palpit, dizzy, SOB, edema, etc ~  11/15: on Aten50-1/2, Felodip10, Hyzaar100-12.5; BP= 102/62 today & we decided to decr the Hyzaar to 1/2 tab daily; he will monitor BP at home ~  5/16: on Aten50-1/2, Plendil10, Hyzaar100/12.5-taking 1/2;  BP= 104/60 & he remains asymptomatic...  VALVULAR HEART DISEASE (ICD-424.90) - followed by [name]/Taylor- pt knows to take AMOX prior to dental work...  ~  2DEcho in 2002 w/ mild AI, MR, TR and normal LVF... ~  2DEcho 2/09 showed LV sl dilated w/ mild DD, norm LVF w/o regional wall motion abn & EF= 55-60%;  mild  AI, mild-mod MR, dil LA @ 34mm... ~  NuclearStressTest 3/10 showed mild inferobas thinning, no ischemia, EF=48% w/ mild global HK... ~  2DEcho 4/10 showed mild AI & MR, mod dil LA, norm LVF w/ EF= 55-60% (Myoview= false result). ~  2DEcho 6/11 showed mod conc LVH, norm wall motion & EF= 50-55%, mild AI, mild thick MV leaflets w/ mild MR, mod LAdil= 109mm. ~  10/12 & 11/13: he saw [facility] f/u> doing  satis, not on anticoagulation, no changes made... ~   EKG 5/15 showed SBrady, rate46, RBBB, NAD...  ~  11/15: he had f/u [name]- doing satis, asymptomatic but BP was low at 108/60 so they decr the aten50 to 1/2 tab daily...  ATRIAL FLUTTER (ICD-427.32) & BUNDLE BRANCH BLOCK, RIGHT (ICD-426.4) - on ASA 325mg /d and rate control strategy...  ~  prev EKG's w/ IRBBB...2/09 w/ IVCD, NSR, & QS wave in III and aVF (new)... check LVF (nl) and wall motion (nl) on echo... ~  he developed AFlutter 2011 & had evals from [name] 7/11 & [name] 10/11-  they rec rate control & ASA, w/ consideration of cath ablation if not controlled... ~  2DEcho 6/11 showed mod conc LVH, norm wall motion & EF= 50-55%, mild AI, mild thick MV leaflets w/ mild MR, mod LAdil= 74mm;   ~  Holter 6/11 revealed AFlutter (ave rate 106, max=145, no pauses)... ~  5/13: he saw [name]- doing well, no symptoms, no indication for pacer...  ~  EKG 11/13 showed sinus brady, rate48, rsr', otherw wnl... ~   EKG 5/15 showed SBrady, rate46, RBBB, NAD...    HYPERCHOLESTEROLEMIA (ICD-272.0) - on SIMVASTATIN 40mg /d, Fish Oil, & diet...  ~  [place] 1/08 showed TChol 106, TG 59, HDL 48, LDL46 ~  FLP 2/09 showed TChol 123, TG 57, HDL 51, LDL 61 ~  FLP 2/10 showed TChol 117, TG 47, HDL 55, LDL 53 ~  FLP 4/11 showed TChol 105, TG 27, HDL 57, LDL 43 ~  FLP 4/12 on Simva40 showed TChol 104, Tg 34, HDL 51, LDL 46 ~  FLP 5/13 on Simva40 showed TChol 112, TG 39, HDL 55, LDL 49 ~  FLP 5/14 on Simva40 showed TChol 113, TG 42, HDL 52, LDL 53 ~  FLP 5/15 on Simva40 showed  TChol 97, TG 20, HDL 51, LDL 42 ~  FLP 5/16 on Simva40 showed  TChol 116, TG 48, HDL 65, LDL 41  Hx of HEMORRHOIDS (ICD-455.6) - last colonoscopy in [place] 3/05 by DrNichols was neg x hems... rec f/u 5 yrs due to fam history & he asks to be seen here (refer to GI)... ~  12/10: had f/u colonoscopy by DrStark- 4mm sessile polyp in cecum= path showed fragments of tubular adenoma; +mild  divertics...   RENAL CALCULUS, HX OF (ICD-V13.01) - surg in 1974 for kid stone...  ~  7/14:  CT Abd & Pelvis showed enhancement of the left kidney w/ marked perineph stranding, mod left hydroureteronephrosis, 24mm left UVJ stone, tortuous left ureter, enlarged prostatelumbar DJD...  DEGENERATIVE JOINT DISEASE (ICD-715.90) - occas neck discomfort without arm pain, numbness, etc... he is very active...  ANXIETY (ICD-300.00)  BASAL CELL CARCINOMA, HX OF (ICD-V10.83) - he follow up w/ Derm in [place] every 3-75months. ~  8/12: note from DrWHoover- full body check & several AKs removed... ~  He continues to f/u w/ Derm every 12mo he says...  Health Maintenance: ~  GI:  DrStark w/ colon 12/10 as above... ~  GU:  4/11> DRE= neg & PSA= 1.50 ~  Immunizations:  he gets yearly Flu shots,  ?Pneumovax,  Given TDAP 5/13...   Past Surgical History  Procedure Laterality Date  . Kidney stone surgery    . Colonoscopy    . Polypectomy      Outpatient Encounter Prescriptions as of 01/17/2015  Medication Sig  . amoxicillin (AMOXIL) 500 MG capsule 4 tabs 1 hr prior to dental procedures.   Marland Kitchen aspirin 81 MG EC tablet Take 81 mg by mouth daily.    Marland Kitchen atenolol (TENORMIN) 50 MG tablet Take 1 tablet (50 mg total) by mouth daily. (Patient taking differently: Take 25 mg by mouth daily. )  . felodipine (PLENDIL) 10 MG 24 hr tablet Take 1 tablet (10 mg total) by mouth daily.  . fish oil-omega-3 fatty acids 1000 MG capsule Take 1 g by mouth daily.    Marland Kitchen losartan-hydrochlorothiazide (HYZAAR) 100-12.5 MG per tablet TAKE 1 TABLET BY MOUTH EVERY DAY (Patient taking differently: TAKE 1/2  TABLET BY MOUTH EVERY DAY)  . Multiple Vitamin (MULTIVITAMIN) tablet Take 1 tablet by mouth daily.    . simvastatin (ZOCOR) 40 MG tablet Take 1 tablet (40 mg total) by mouth at bedtime.   No facility-administered encounter medications on file as of 01/17/2015.    No Known Allergies   Current Medications, Allergies, Past Medical  History, Past Surgical History, Family History, and Social History were reviewed in [employer] record.    Review of Systems         See HPI - all other systems neg except as noted... The patient denies anorexia, fever, weight loss, weight gain, vision loss, decreased hearing, hoarseness, chest pain, syncope, dyspnea on exertion, peripheral edema, prolonged cough, headaches, hemoptysis, abdominal pain, melena, hematochezia, severe indigestion/heartburn, hematuria, incontinence, muscle weakness, suspicious skin lesions, transient blindness, difficulty walking, depression, unusual weight change, abnormal bleeding, enlarged lymph nodes, and angioedema.    Objective:   Physical Exam    WD, WN, 74 y/o WM in NAD... GENERAL:  Alert & oriented; pleasant & cooperative... HEENT:  [place]/AT, EOM-wnl, PERRLA, Fundi-benign, EACs-clear, TMs-wnl, NOSE-clear, THROAT-clear & wnl. NECK:  Supple w/ fairROM; no JVD; normal carotid impulses w/o bruits; no thyromegaly or nodules palpated; no lymphadenopathy. CHEST:  Clear to P & A; without wheezes/ rales/ or rhonchi. HEART:  Regular Rhythm;  gr1/6 SEM LSB without rubs or gallops heard... ABDOMEN:  Soft & nontender; normal bowel sounds; no organomegaly or masses detected. (RECTAL:  Neg - prostate 2+ & nontender w/o nodules; stool hematest neg) EXT: without deformities or arthritic changes; no varicose veins/ venous insuffic/ or edema. NEURO:  CN's intact; motor testing normal; sensory testing normal; gait normal & balance OK. DERM:  No lesions noted; no rash etc...  RADIOLOGY DATA:  Reviewed in the EPIC EMR & discussed w/ the patient...  LABORATORY DATA:  Reviewed in the EPIC EMR & discussed w/ the patient...    Assessment & Plan:    HBP>  BP remains on the low side despite decr Aten50&Hyzaar to 1/2 tab/d; REC to continue same for now & watch BP...  Hx AFlutter, RBBB, Valv heart dis>  Followed by [name] & [name]; stable, continue same  Rx...  CHOL>  Stable on diet + Simva40; continue same...  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  Other medical issues as noted...    Patient's Medications  New Prescriptions   No medications on file  Previous Medications  AMOXICILLIN (AMOXIL) 500 MG CAPSULE    4 tabs 1 hr prior to dental procedures.    ASPIRIN 81 MG EC TABLET    Take 81 mg by mouth daily.     ATENOLOL (TENORMIN) 50 MG TABLET    Take 1 tablet (50 mg total) by mouth daily.   FELODIPINE (PLENDIL) 10 MG 24 HR TABLET    Take 1 tablet (10 mg total) by mouth daily.   FISH OIL-OMEGA-3 FATTY ACIDS 1000 MG CAPSULE    Take 1 g by mouth daily.     LOSARTAN-HYDROCHLOROTHIAZIDE (HYZAAR) 100-12.5 MG PER TABLET    TAKE 1 TABLET BY MOUTH EVERY DAY   MULTIPLE VITAMIN (MULTIVITAMIN) TABLET    Take 1 tablet by mouth daily.     SIMVASTATIN (ZOCOR) 40 MG TABLET    Take 1 tablet (40 mg total) by mouth at bedtime.  Modified Medications   No medications on file  Discontinued Medications   No medications on file

## 2015-02-13 ENCOUNTER — Other Ambulatory Visit: Payer: Self-pay | Admitting: Pulmonary Disease

## 2015-05-02 ENCOUNTER — Other Ambulatory Visit: Payer: Self-pay | Admitting: Pulmonary Disease

## 2015-05-03 ENCOUNTER — Ambulatory Visit (INDEPENDENT_AMBULATORY_CARE_PROVIDER_SITE_OTHER): Payer: Medicare Other | Admitting: Pulmonary Disease

## 2015-05-03 ENCOUNTER — Encounter: Payer: Self-pay | Admitting: Pulmonary Disease

## 2015-05-03 VITALS — BP 116/70 | HR 82 | Temp 97.1°F | Wt 176.0 lb

## 2015-05-03 DIAGNOSIS — R000 Tachycardia, unspecified: Secondary | ICD-10-CM | POA: Diagnosis not present

## 2015-05-03 DIAGNOSIS — I4891 Unspecified atrial fibrillation: Secondary | ICD-10-CM | POA: Insufficient documentation

## 2015-05-03 DIAGNOSIS — I480 Paroxysmal atrial fibrillation: Secondary | ICD-10-CM

## 2015-05-03 MED ORDER — APIXABAN 5 MG PO TABS: 5.0000 mg | ORAL_TABLET | Freq: Two times a day (BID) | ORAL | Status: DC

## 2015-05-03 NOTE — Progress Notes (Signed)
Subjective:    Patient ID: Carlos [name], male    DOB: [birth year], 74 y.o.   MRN: [id number]  HPI 74 y/o WM here for a follow up visit... he has multiple medical problems as noted below...   ~  SEE PREV EPIC NOTES FOR OLDER DATA >>    CXR 5/14 showed heart at upper lim of norm, mild atherosclerotic changes in Ao, bilat nipple shadows, NAD...  LABS 5/14:  FLP- at goals on simva40;  Chems- wnl;  CBC- wnl x plat=130K;  TSH=1.04;  PSA=1.70...  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    HBP> on Aten50, Plendil10, Hyzaar100-12.5;  BP= 118/64 & denies CP, palpit, dizzy, SOB, edema, etc...    Valv Heart Dis> on Amoxicillin Prn for SBE prophy; he had f/u 5/15 [name]- PAFlutter, mild AI&MR, doing satis/ no change-same meds...    AFlutter, RBBB> on ASA81; denies CP, palpit, dizzy, etc; he had f/u [name] 5/13- doing well & no need for pacer...    Chol> on Simva40, FishOil; on diet & wt stable- FLP 5/15 showed TChol 97, TG 20, HDL 51, LDL 42    GI- polyps, hems> he remains asymptomatic w/o abd pain, n/v, c/d, blood seen; last colon 12/10 w/ divertics, 57mm adenoma, & f/u 58yrs...    Kidney stones> he remains asymptomatic w/o pain, dysuria, blood, etc...    DJD> on OTC analgesics as needed; he continues to work hard, min arthritic symptoms, using OTC meds as needed...    Anxiety> doing satis, not on meds...    Skin cancer> he continues to f/u w/ Derm Q18mo... We reviewed prob list, meds, xrays and labs> see below for updates >>   EKG 5/15 showed SBrady, rate46, RBBB, NAD...  LABS 5/15:  FLP- at goals on Simva40;  Chems- wnl;  CBC- wnl;  TSH=0.85;  PSA=1.88  ~  July 20, 2014:  38mo ROV & [name] continues to do well- no new  complaints or concerns;  He saw [name] several weeks ago- Hx SBrady & Aflutter, RBBB; doing well w/o symptoms but BP on the low end so they cut back on his Atenolol50 to 1/2 tab daily...     BP remains on low side w/ Aten50-1/2, Felodip10, Hyzaar100-12.5; BP= 102/62 today & we decided to decr the Hyzaar to 1/2 tab daily; he will monitor BP at home...    He remains on Simva40 + FishOil and last FLP 5/15 looked great, continue same...     ROS is otherw neg- he continues f/u w/ Derm every 3-9mo... We reviewed prob list, meds, xrays and labs> see below for updates >> He had the 2015 Flu vaccine already; given PREVNAR-13 today...   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    HBP>  on Aten50-1/2, Plendil10, Hyzaar100/12.5-taking 1/2;  BP= 104/60 & denies CP, palpit, dizzy, SOB, edema, etc...    Valv Heart Dis> on Amoxicillin Prn for SBE prophy; he had f/u 5/15 [name]- PAFlutter, mild AI&MR, doing satis/ no change-same meds...    AFlutter, RBBB> on ASA81; denies CP, palpit, dizzy, etc; he had f/u [name] 5/15- doing well & no need for pacer...    Chol> on Simva40, FishOil; on diet & wt stable- FLP 5/16 showed TChol 116, TG 48, HDL 65, LDL 41; continue same...    GI- polyps, hems> he remains asymptomatic w/o abd pain, n/v, c/d, blood seen; last colon 12/10 w/ divertics, 16mm adenoma, & f/u 26yrs...    Kidney stones> he had stone in the 1970's that required surg; last stone was 7/14 & he passed it; he remains asymptomatic w/o pain, dysuria, blood, etc;    DJD> on OTC analgesics as needed; he continues to work hard, min arthritic symptoms,  using OTC meds as needed...    Anxiety> doing satis, not on meds...    Skin cancer> he continues to f/u w/ Derm Q74mo... We reviewed prob list, meds, xrays and labs> see below for updates >>   CXR 5/16 showed norm heart size, clear lungs, NAD...  LABS 5/16:  FLP- at goals on Simva40;  Chems- wnl x BS=121 (diet rx);  CBC- wnl;  TSH=1.16;  PSA=2.10... PLAN>> stable overall, continue same meds & watch BP at home (may need to decr the Plendil if BP on low end)...  ~  May 03, 2015:  Add-on appt requested by pt for "BP & heart rate"> he states that he usually checks BP/HR every day & it has maintained in a narrow range w/ BP=100-110 sys and HR=40s-50s on his meds; he was recently in [place] working around a relatives house & forgot his cuff; on ret to [place] he noted BP was elevated in the 120-130 range and pulse up to 85/min which really concerned him; he denies CP, palpit, dizzy, SOB/DOE, edema, etc; CV hx follows>     HBP> on Aten50-1/2, Plendil10, Hyzaar100/12.5-taking 1/2;  BP= 116/70 & pulse=82/min but irreg; denies CP, palpit, dizzy, SOB, edema, etc...    Valv Heart Dis> on Amoxicillin Prn for SBE prophy; he last saw [name] 5/15> HxPAFlutter, mild AI&MR (last Echo 2011), doing satis/ no change- same meds...    HxAFlutter, RBBB> on ASA81; denies CP, palpit, dizzy, etc; he had f/u [name]11/15- doing well, pulse was in 40s & Aten50 decr to 25mg /d... EXAM reveals Afeb, VSS but pulse irreg, O2sat=98% on RA;  HEENT- neg, clears throat a lot;  Chest- clear w/o w/r/r;  Heart- irreg irreg HQ4/6 SEM & diastolic murmur at base;  Abd- soft, neg;  Ext- neg w/o c/c/e...  EKG shows AFib w/ controlled HR~80, one PVC noted, IRBBB, sl incr voltage... IMP/PLAN>>  I explained to the pt & his wife that his seemingly sudden incr in his baseline heart rate was related to his developing atrial fibrillation w/ variable VR;  He prev had AFlutter (last 2011- see below & notes from [name]&Taylor);  I discussed the case  w/ [name] by phone & he rec starting anticoag w/ Eliquis5mg Bid, continue his other meds the same, and they will call w/ time for a f/u appt w/ 2DEcho... CHADsVASc=2-3 (he will be 74 in 53mo, +HBP).          Problem List:  HYPERTENSION (ICD-401.9) - controlled on ATENOLOL 50mg /d,  FELODIPINE 10mg /d, & LOSARTAN/HCT 100-12.5 daily... ~  see 2DEcho  below> ~  CXR 4/11 showed borderline cardiomeg, clear lungs, DJD spine, NAD;  CXR 4/12 same> NAD, WNL... ~  10/11:   BP= 110/66 & similar at home; pt denies HA, fatigue, visual changes, CP, palipit, dizziness, syncope, dyspnea, edema, etc... ~  4/12:  BP= 110/84 & he remains asymptomatic... We decided to decr the ATENOLOL from $RemoveBefor'50mg'nVLZeEzpXDUK$  to $R'25mg'Br$ /d due to his bradycardia... ~  CXR 4/12 showed normal heart size, clear lungs, NAD... ~  10/12:  BP= 120/72, Pulse= 50 & regular; he went back on his Aten50/d; he remains asymptomatic w/o CP, palpit, dizzy, syncope, SOB, edema, etc... ~  5/13:  BP= 122/80 & he remains bradycardic but doesn't want to cut the BBlocker; denies CP, palpit, dizzy, syncope, SOB, etc. ~  11/13: on Aten50, Plendil10, Hyzaar100-12.5;  BP= 112/82 & denies CP, palpit, dizzy, SOB, edema, etc... ~  5/14: on Aten50, Plendil10, Hyzaar100-12.5;  BP= 118/58 & denies CP, palpit, dizzy, SOB, edema, etc ~  CXR 5/14 showed heart at upper lim of norm, mild atherosclerotic changes in Ao, bilat nipple shadows, NAD ~  11/14: He remains on ASA81, Aten50, Plendil10, Hyzaar100-12.5 and BP is well controlled at 110/66 today; he feels well w/o CP, palpit, dizzy, SOB, edema, etc. ~  5/15: on Aten50, Plendil10, Hyzaar100-12.5;  BP= 118/64 & denies CP, palpit, dizzy, SOB, edema, etc ~  11/15: on Aten50-1/2, Felodip10, Hyzaar100-12.5; BP= 102/62 today & we decided to decr the Hyzaar to 1/2 tab daily; he will monitor BP at home ~  5/16: on Aten50-1/2, Plendil10, Hyzaar100/12.5-taking 1/2;  BP= 104/60 & he remains asymptomatic...  VALVULAR HEART DISEASE (ICD-424.90) -  followed by [name]/Taylor- pt knows to take AMOX prior to dental work...  ~  2DEcho in 2002 w/ mild AI, MR, TR and normal LVF... ~  2DEcho 2/09 showed LV sl dilated w/ mild DD, norm LVF w/o regional wall motion abn & EF= 55-60%;  mild AI, mild-mod MR, dil LA @ 86mm... ~  NuclearStressTest 3/10 showed mild inferobas thinning, no ischemia, EF=48% w/ mild global HK... ~  2DEcho 4/10 showed mild AI & MR, mod dil LA, norm LVF w/ EF= 55-60% (Myoview= false result). ~  2DEcho 6/11 showed mod conc LVH, norm wall motion & EF= 50-55%, mild AI, mild thick MV leaflets w/ mild MR, mod LAdil= 35mm. ~  10/12 & 11/13: he saw [facility] for [facility] f/u> doing satis, not on anticoagulation, no changes made... ~   EKG 5/15 showed SBrady, rate46, RBBB, NAD...  ~  11/15: he had f/u [name]- doing satis, asymptomatic but BP was low at 108/60 so they decr the aten50 to 1/2 tab daily...  AFIB >> ATRIAL FLUTTER (ICD-427.32) & BUNDLE BRANCH BLOCK, RIGHT (ICD-426.4) - on ASA $Remo'325mg'fjXYJ$ /d and rate control strategy...  ~  prev EKG's w/ IRBBB...2/09 w/ IVCD, NSR, & QS wave in III and aVF (new)... check LVF (nl) and wall motion (nl) on echo... ~  he developed AFlutter 2011 & had evals from [name] 7/11 & [name] 10/11-  they rec rate control & ASA, w/ consideration of cath ablation if not controlled... ~  2DEcho 6/11 showed mod conc LVH, norm wall motion & EF= 50-55%, mild AI, mild thick MV leaflets w/ mild MR, mod LAdil= 65mm;   ~  [place] 6/11 revealed AFlutter (ave rate 106, max=145, no pauses)... ~  5/13: he saw [name]- doing well, no symptoms, no indication for pacer...  ~  EKG 11/13 showed sinus brady, rate48, rsr', otherw wnl... ~   EKG 5/15  showed SBrady, rate46, RBBB, NAD.Marland Kitchen.   ~  8/16: he presented w/ increased HR (noted pulse up to 80's & irreg)=> EKG w/ AFib & controlled VR where prev he'd been in SinusBrady w/ pulse~50; denied CP/ palpit/ SOB/ edema; started on Eliquis & referred to Cards for f/u &  2DEcho.  HYPERCHOLESTEROLEMIA (ICD-272.0) - on SIMVASTATIN $RemoveBefore'40mg'gscdHxdSeMOID$ /d, Fish Oil, & diet...  ~  [place] 1/08 showed TChol 106, TG 59, HDL 48, LDL46 ~  FLP 2/09 showed TChol 123, TG 57, HDL 51, LDL 61 ~  FLP 2/10 showed TChol 117, TG 47, HDL 55, LDL 53 ~  FLP 4/11 showed TChol 105, TG 27, HDL 57, LDL 43 ~  FLP 4/12 on Simva40 showed TChol 104, Tg 34, HDL 51, LDL 46 ~  FLP 5/13 on Simva40 showed TChol 112, TG 39, HDL 55, LDL 49 ~  FLP 5/14 on Simva40 showed TChol 113, TG 42, HDL 52, LDL 53 ~  FLP 5/15 on Simva40 showed  TChol 97, TG 20, HDL 51, LDL 42 ~  FLP 5/16 on Simva40 showed  TChol 116, TG 48, HDL 65, LDL 41  Hx of HEMORRHOIDS (ICD-455.6) - last colonoscopy in [place] 3/05 by DrNichols was neg x hems... rec f/u 5 yrs due to fam history & he asks to be seen here (refer to GI)... ~  12/10: had f/u colonoscopy by DrStark- 17mm sessile polyp in cecum= path showed fragments of tubular adenoma; +mild divertics...   RENAL CALCULUS, HX OF (ICD-V13.01) - surg in 1974 for kid stone...  ~  7/14:  CT Abd & Pelvis showed enhancement of the left kidney w/ marked perineph stranding, mod left hydroureteronephrosis, 6mm left UVJ stone, tortuous left ureter, enlarged prostatelumbar DJD...  DEGENERATIVE JOINT DISEASE (ICD-715.90) - occas neck discomfort without arm pain, numbness, etc... he is very active...  ANXIETY (ICD-300.00)  BASAL CELL CARCINOMA, HX OF (ICD-V10.83) - he follow up w/ Derm in [place] every 3-23months. ~  8/12: note from DrWHoover- full body check & several AKs removed... ~  He continues to f/u w/ Derm every 56mo he says...  Health Maintenance: ~  GI:  DrStark w/ colon 12/10 as above... ~  GU:  4/11> DRE= neg & PSA= 1.50 ~  Immunizations:  he gets yearly Flu shots,  ?Pneumovax,  Given TDAP 5/13...   Past Surgical History  Procedure Laterality Date  . Kidney stone surgery    . Colonoscopy    . Polypectomy      Outpatient Encounter Prescriptions as of 05/03/2015  Medication Sig  .  aspirin 81 MG EC tablet Take 81 mg by mouth daily.    Marland Kitchen atenolol (TENORMIN) 50 MG tablet Take 1 tablet (50 mg total) by mouth daily. (Patient taking differently: Take 25 mg by mouth daily. )  . felodipine (PLENDIL) 10 MG 24 hr tablet TAKE 1 TABLET BY MOUTH EVERY DAY  . fish oil-omega-3 fatty acids 1000 MG capsule Take 1 g by mouth daily.    Marland Kitchen losartan-hydrochlorothiazide (HYZAAR) 100-12.5 MG per tablet TAKE 1 TABLET BY MOUTH EVERY DAY (Patient taking differently: TAKE 1/2  TABLET BY MOUTH EVERY DAY)  . Multiple Vitamin (MULTIVITAMIN) tablet Take 1 tablet by mouth daily.    . simvastatin (ZOCOR) 40 MG tablet TAKE 1 TABLET BY MOUTH EVERY NIGHT AT BEDTIME  . amoxicillin (AMOXIL) 500 MG capsule 4 tabs 1 hr prior to dental procedures.     No Known Allergies   Current Medications, Allergies, Past Medical History, Past Surgical History, Family History,  and Social History were reviewed in [employer] record.    Review of Systems         See HPI - all other systems neg except as noted... The patient denies anorexia, fever, weight loss, weight gain, vision loss, decreased hearing, hoarseness, chest pain, syncope, dyspnea on exertion, peripheral edema, prolonged cough, headaches, hemoptysis, abdominal pain, melena, hematochezia, severe indigestion/heartburn, hematuria, incontinence, muscle weakness, suspicious skin lesions, transient blindness, difficulty walking, depression, unusual weight change, abnormal bleeding, enlarged lymph nodes, and angioedema.    Objective:   Physical Exam    WD, WN, 74 y/o WM in NAD... GENERAL:  Alert & oriented; pleasant & cooperative... HEENT:  [place]/AT, EOM-wnl, PERRLA, Fundi-benign, EACs-clear, TMs-wnl, NOSE-clear, THROAT-clear & wnl. NECK:  Supple w/ fairROM; no JVD; normal carotid impulses w/o bruits; no thyromegaly or nodules palpated; no lymphadenopathy. CHEST:  Clear to P & A; without wheezes/ rales/ or rhonchi. HEART:  iregular rhythm;   gr1/6 SEM LSB & WE9/9 diastolic murmur in aortic area, without rubs or gallops heard... ABDOMEN:  Soft & nontender; normal bowel sounds; no organomegaly or masses detected. (RECTAL:  Neg - prostate 2+ & nontender w/o nodules; stool hematest neg) EXT: without deformities or arthritic changes; no varicose veins/ venous insuffic/ or edema. NEURO:  CN's intact; motor testing normal; sensory testing normal; gait normal & balance OK. DERM:  No lesions noted; no rash etc...  RADIOLOGY DATA:  Reviewed in the EPIC EMR & discussed w/ the patient...  LABORATORY DATA:  Reviewed in the EPIC EMR & discussed w/ the patient...    Assessment & Plan:    NEW ONSET AFIB>> odd that he became concerned when his pulse rate increased to normal range! EKG shows AFib w/ controlled VR; he is essentially asymptomatic- we discussed w/ Cards-[name] & started pt on Eliquis anticoag for Chads-Vasc of 2-3 & they will contact pt for follow up visit w/ repeat 2DEcho...   HBP>  BP remains on the low side despite decr Aten50&Hyzaar to 1/2 tab/d; REC to continue same for now & watch BP...  Hx AFlutter, RBBB, Valv heart dis>  Followed by [name] & [name]....  CHOL>  Stable on diet + Simva40; continue same...  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  Other medical issues as noted...    Patient's Medications  New Prescriptions   APIXABAN (ELIQUIS) 5 MG TABS TABLET    Take 1 tablet (5 mg total) by mouth 2 (two) times daily.  Previous Medications   AMOXICILLIN (AMOXIL) 500 MG CAPSULE    4 tabs 1 hr prior to dental procedures.    ASPIRIN 81 MG EC TABLET    Take 81 mg by mouth daily.     ATENOLOL (TENORMIN) 50 MG TABLET    Take 1 tablet (50 mg total) by mouth daily.   FELODIPINE (PLENDIL) 10 MG 24 HR TABLET    TAKE 1 TABLET BY MOUTH EVERY DAY   FISH OIL-OMEGA-3 FATTY ACIDS 1000 MG CAPSULE    Take 1 g by mouth daily.     LOSARTAN-HYDROCHLOROTHIAZIDE (HYZAAR) 100-12.5  MG PER TABLET    TAKE 1 TABLET BY MOUTH EVERY DAY   MULTIPLE VITAMIN (MULTIVITAMIN) TABLET    Take 1 tablet by mouth daily.     SIMVASTATIN (ZOCOR) 40 MG TABLET    TAKE 1 TABLET BY MOUTH EVERY NIGHT AT BEDTIME  Modified Medications   No medications on file  Discontinued Medications   No medications  on file

## 2015-05-03 NOTE — Patient Instructions (Signed)
Today we updated your med list in our EPIC system...    Continue your current medications the same...  Your EKG shows Atrial Fib...    [name] wants [place] to start you on an anticoagulant ELIQUIS 5mg  one tab twice daily...    Continue your other medications the same...  They cannot see you in their office today but he will call you w/ an ASAP appt to check an ECHOCARDIOGRAM & discuss the options for your AFib...  Call for any questions or if I can be of service in any way.Marland KitchenMarland Kitchen

## 2015-05-05 ENCOUNTER — Ambulatory Visit (HOSPITAL_COMMUNITY)
Admission: RE | Admit: 2015-05-05 | Discharge: 2015-05-05 | Disposition: A | Discharge status: Home / Self Care | Payer: Medicare Other | Source: Ambulatory Visit | Attending: Nurse Practitioner | Admitting: Nurse Practitioner

## 2015-05-05 ENCOUNTER — Encounter (HOSPITAL_COMMUNITY): Payer: Self-pay | Admitting: Nurse Practitioner

## 2015-05-05 ENCOUNTER — Telehealth: Payer: Self-pay | Admitting: *Deleted

## 2015-05-05 VITALS — BP 100/60 | HR 50 | Ht 73.0 in | Wt 176.8 lb

## 2015-05-05 DIAGNOSIS — I480 Paroxysmal atrial fibrillation: Secondary | ICD-10-CM | POA: Diagnosis not present

## 2015-05-05 NOTE — Progress Notes (Signed)
Patient ID: Carlos [name], male   DOB: [birth year], 74 y.o.   MRN: [id number]     Primary Care Physician: Noralee Space, MD Electrophysiologist:Dr. Sammuel Bailiff [name] is a 74 y.o. male with a h/o paroxysmal asymptomatic aflutter for several years that has been quiescent that presented to Dr. Lenna Gilford yesterday due to noticing his heart rate on his fitbit was in the 80's. His usual HR is usually is in the 50's. EKG at Dr. Jeannine Kitten office did confirm afib. Pt was completely asymptomatic and if he had not ben wearing his fitbit he would not have been aware. He was started on apixaban 5 mg bid for a chadsvasc of 3(age- 39 in Oct, HTN). He is being further evaluated in the afib clinic today. EKG today shows SR. He had been out of town x one week and had eaten out a lot and had not been sleeping in his bed that elevates the head, which usually eliminates his snoring.Otherwise, nothing different in his health. He feels well today.  Today, he denies symptoms of palpitations, chest pain, shortness of breath, orthopnea, PND, lower extremity edema, dizziness, presyncope, syncope, or neurologic sequela. The patient is tolerating medications without difficulties and is otherwise without complaint today.   Past Medical History  Diagnosis Date  . Allergic rhinitis   . HTN (hypertension)   . Aortic insufficiency     mild... echo... 02/2010  . Mitral regurgitation     mild... echo.. 02/2010  . LVH (left ventricular hypertrophy)     moderate... echo... 02/2010  . Diastolic dysfunction     EF 50-55%... echo.. 02/2010 (55-60% echo 2009)  . Incomplete RBBB     IRBBB; sinus bradycardia w/ PVCs  . Sinus bradycardia   . PVC (premature ventricular contraction)   . Atrial flutter 02/03/2010    Consult by Dr. Lovena Le... June, 2011.... plan rate control...  Coumadin not needed... ablation if rate not controlled well  . Dyslipidemia   . Hemorrhoids   . Renal calculus   . Degenerative joint disease   . Anxiety   .  Basal cell carcinoma   . Ejection fraction     EF 50-55%, echo, June, 2011  . Chest pain     Nuclear, March, 2010, no ischemia  . Allergy    Past Surgical History  Procedure Laterality Date  . Kidney stone surgery    . Colonoscopy    . Polypectomy      Current Outpatient Prescriptions  Medication Sig Dispense Refill  . apixaban (ELIQUIS) 5 MG TABS tablet Take 1 tablet (5 mg total) by mouth 2 (two) times daily. 60 tablet 5  . atenolol (TENORMIN) 50 MG tablet Take 1 tablet (50 mg total) by mouth daily. (Patient taking differently: Take 25 mg by mouth daily. ) 90 tablet 1  . felodipine (PLENDIL) 10 MG 24 hr tablet TAKE 1 TABLET BY MOUTH EVERY DAY (Patient taking differently: TAKE 0.5 TABLET BY MOUTH EVERY DAY) 90 tablet 2  . fish oil-omega-3 fatty acids 1000 MG capsule Take 1 g by mouth daily.      Marland Kitchen losartan-hydrochlorothiazide (HYZAAR) 100-12.5 MG per tablet TAKE 1 TABLET BY MOUTH EVERY DAY (Patient taking differently: TAKE 1/2  TABLET BY MOUTH EVERY DAY) 90 tablet 0  . Multiple Vitamin (MULTIVITAMIN) tablet Take 1 tablet by mouth daily.      . simvastatin (ZOCOR) 40 MG tablet TAKE 1 TABLET BY MOUTH EVERY NIGHT AT BEDTIME 90 tablet 1  . amoxicillin (AMOXIL) 500  MG capsule 4 tabs 1 hr prior to dental procedures.      No current facility-administered medications for this encounter.    No Known Allergies  Social History   Social History  . Marital Status: Married    Spouse Name: Lamonte Hartt  . Number of Children: 2  . Years of Education: N/A   Occupational History  . Not on file.   Social History Main Topics  . Smoking status: Never Smoker   . Smokeless tobacco: Never Used  . Alcohol Use: 1.8 oz/week    3 Glasses of wine per week     Comment: 3 glasses of wine per week  . Drug Use: No  . Sexual Activity: Not on file   Other Topics Concern  . Not on file   Social History Narrative    Family History  Problem Relation Age of Onset  . Hypertension Mother   . Colon  cancer Paternal Aunt   . Colon cancer Paternal Uncle   . Stroke Mother 25    ROS- All systems are reviewed and negative except as per the HPI above  Physical Exam: Filed Vitals:   05/05/15 1105  BP: 100/60  Pulse: 50  Height: 6\' 1"  (1.854 m)  Weight: 176 lb 12.8 oz (80.196 kg)    GEN- The patient is well appearing, alert and oriented x 3 today.   Head- normocephalic, atraumatic Eyes-  Sclera clear, conjunctiva pink Ears- hearing intact Oropharynx- clear Neck- supple, no JVP Lymph- no cervical lymphadenopathy Lungs- Clear to ausculation bilaterally, normal work of breathing Heart- Regular rate and rhythm, no murmurs, rubs or gallops, PMI not laterally displaced GI- soft, NT, ND, + BS Extremities- no clubbing, cyanosis, or edema MS- no significant deformity or atrophy Skin- no rash or lesion Psych- euthymic mood, full affect Neuro- strength and sensation are intact  EKG- Sinus brady with sinus arrhythmia 50 bpm, IRBBB, pr int 170 ms, QRS 116 ms, QTc 430 ms.  Last echo 2011-Left ventricle: The cavity size was normal. There was moderate  concentric hypertrophy. Systolic function was normal. The  estimated ejection fraction was in the range of 50% to 55%. Wall  motion was normal; there were no regional wall motion  abnormalities. - Aortic valve: Mild regurgitation. Valve area: 3.45cm^2(VTI). Valve  area: 3.24cm^2 (Vmax). - Mitral valve: Calcified annulus. Mildly thickened leaflets . Mild  regurgitation. - Left atrium: The atrium was moderately dilated. - Right atrium: The atrium was mildly dilated. - Atrial septum: No defect or patent foramen ovale was identified.  Assessment and Plan:  1. Asymptomatic afib In SR today Continue atenolol Echo ordered  2. Chadsvasc of 3 Continue apixaban 5 mg bid    F/u with Dr. Lovena Le as per recall list in November Can be seen in afib clinic for interim issues.  Geroge Baseman Katelind Pytel, [facility] [address] [place], [place] [address] [phone]

## 2015-05-05 NOTE — Telephone Encounter (Signed)
PA submitted to Cover My Meds for Eliquis 5mg  Key: [id number]. Pending review. Will await response.

## 2015-05-05 NOTE — Patient Instructions (Addendum)
I will call you to schedule echo  Your physician has recommended you make the following change in your medication:  1)Stop Aspirin  Follow up with Dr. Lovena Le

## 2015-05-06 NOTE — Telephone Encounter (Signed)
Approval received for Eliquis 5mg . Valid till 05/04/16 under Medicare Part D. Pharmacy notified.  Ref #: [id number]

## 2015-05-11 ENCOUNTER — Ambulatory Visit (HOSPITAL_COMMUNITY)
Admission: RE | Admit: 2015-05-11 | Discharge: 2015-05-11 | Disposition: A | Discharge status: Home / Self Care | Payer: Medicare Other | Source: Ambulatory Visit | Attending: Nurse Practitioner | Admitting: Nurse Practitioner

## 2015-05-11 DIAGNOSIS — I340 Nonrheumatic mitral (valve) insufficiency: Secondary | ICD-10-CM | POA: Diagnosis not present

## 2015-05-11 DIAGNOSIS — I341 Nonrheumatic mitral (valve) prolapse: Secondary | ICD-10-CM | POA: Insufficient documentation

## 2015-05-11 DIAGNOSIS — I4891 Unspecified atrial fibrillation: Secondary | ICD-10-CM | POA: Diagnosis not present

## 2015-05-11 DIAGNOSIS — I371 Nonrheumatic pulmonary valve insufficiency: Secondary | ICD-10-CM | POA: Insufficient documentation

## 2015-05-11 DIAGNOSIS — I10 Essential (primary) hypertension: Secondary | ICD-10-CM | POA: Insufficient documentation

## 2015-05-11 DIAGNOSIS — I480 Paroxysmal atrial fibrillation: Secondary | ICD-10-CM

## 2015-05-11 DIAGNOSIS — E785 Hyperlipidemia, unspecified: Secondary | ICD-10-CM | POA: Diagnosis not present

## 2015-05-11 DIAGNOSIS — I77810 Thoracic aortic ectasia: Secondary | ICD-10-CM | POA: Insufficient documentation

## 2015-05-11 DIAGNOSIS — I351 Nonrheumatic aortic (valve) insufficiency: Secondary | ICD-10-CM | POA: Insufficient documentation

## 2015-05-11 DIAGNOSIS — I517 Cardiomegaly: Secondary | ICD-10-CM | POA: Insufficient documentation

## 2015-05-11 NOTE — Progress Notes (Signed)
  Echocardiogram 2D Echocardiogram has been performed.  Darlina Sicilian M 05/11/2015, 3:28 PM

## 2015-05-23 ENCOUNTER — Other Ambulatory Visit: Payer: Self-pay | Admitting: Pulmonary Disease

## 2015-06-09 DIAGNOSIS — Z85828 Personal history of other malignant neoplasm of skin: Secondary | ICD-10-CM | POA: Diagnosis not present

## 2015-06-09 DIAGNOSIS — Z08 Encounter for follow-up examination after completed treatment for malignant neoplasm: Secondary | ICD-10-CM | POA: Diagnosis not present

## 2015-07-20 ENCOUNTER — Ambulatory Visit (INDEPENDENT_AMBULATORY_CARE_PROVIDER_SITE_OTHER): Payer: Medicare Other | Admitting: Pulmonary Disease

## 2015-07-20 ENCOUNTER — Encounter: Payer: Self-pay | Admitting: Pulmonary Disease

## 2015-07-20 VITALS — BP 116/68 | HR 50 | Temp 98.0°F | Wt 176.0 lb

## 2015-07-20 DIAGNOSIS — I351 Nonrheumatic aortic (valve) insufficiency: Secondary | ICD-10-CM

## 2015-07-20 DIAGNOSIS — I480 Paroxysmal atrial fibrillation: Secondary | ICD-10-CM | POA: Diagnosis not present

## 2015-07-20 DIAGNOSIS — I340 Nonrheumatic mitral (valve) insufficiency: Secondary | ICD-10-CM | POA: Diagnosis not present

## 2015-07-20 DIAGNOSIS — I38 Endocarditis, valve unspecified: Secondary | ICD-10-CM | POA: Insufficient documentation

## 2015-07-20 NOTE — Patient Instructions (Signed)
Today we updated your med list in our EPIC system...    Continue your current medications the same...  Call for any questions...  Let's plan a follow up visit in 6mo, sooner if needed for problems...   

## 2015-08-01 ENCOUNTER — Other Ambulatory Visit: Payer: Self-pay | Admitting: Pulmonary Disease

## 2015-08-02 ENCOUNTER — Ambulatory Visit (INDEPENDENT_AMBULATORY_CARE_PROVIDER_SITE_OTHER): Payer: Medicare Other | Admitting: Internal Medicine

## 2015-08-02 ENCOUNTER — Encounter: Payer: Self-pay | Admitting: Internal Medicine

## 2015-08-02 VITALS — BP 120/74 | HR 40 | Ht 72.0 in | Wt 175.2 lb

## 2015-08-02 DIAGNOSIS — I495 Sick sinus syndrome: Secondary | ICD-10-CM | POA: Diagnosis not present

## 2015-08-02 DIAGNOSIS — I10 Essential (primary) hypertension: Secondary | ICD-10-CM | POA: Diagnosis not present

## 2015-08-02 DIAGNOSIS — I480 Paroxysmal atrial fibrillation: Secondary | ICD-10-CM

## 2015-08-02 DIAGNOSIS — I483 Typical atrial flutter: Secondary | ICD-10-CM

## 2015-08-02 NOTE — Patient Instructions (Signed)

## 2015-08-02 NOTE — Assessment & Plan Note (Signed)
He is essentially asymptomatic. He will continue Eliquis. He is on low dose atenolol. Because he is not bothered by his bradycardia will continue the beta blocker but would stop if he becomes symptomatic.

## 2015-08-02 NOTE — Assessment & Plan Note (Signed)
His blood pressure is well controlled. No change in meds.  

## 2015-08-02 NOTE — Assessment & Plan Note (Signed)
He has had no recurrent symptomatic atrial flutter. Will follow.

## 2015-08-02 NOTE — Assessment & Plan Note (Signed)
He is currently asymptomatic. We discussed the warning signs to watch for if her bradycardia worsens. I suspect he will ultimately need a pPM.

## 2015-08-02 NOTE — Progress Notes (Signed)
HPI Carlos [name] returns today for followup. He is a very pleasant 74 year old man with palpitations and documented paroxysmal atrial flutter as well as brief episodes of bradycardia. He has been nearly asymptomatic. He denies chest pain, shortness of breath, and syncope. He has retired from [employer]. He had not had any atrial fib for several years when he noticed while checking his blood pressure that his HR was faster. He was found to have atrial fib with a controlled VR. He has been on beta blocker therapy. He denies chest pain. He is able to walk. The patient denies syncope.   No Known Allergies   Current Outpatient Prescriptions  Medication Sig Dispense Refill  . amoxicillin (AMOXIL) 500 MG capsule 4 tabs by mouth 1 hr prior to dental procedures.    Marland Kitchen apixaban (ELIQUIS) 5 MG TABS tablet Take 1 tablet (5 mg total) by mouth 2 (two) times daily. 60 tablet 5  . atenolol (TENORMIN) 50 MG tablet Take 25 mg by mouth daily.    . felodipine (PLENDIL) 10 MG 24 hr tablet TAKE 1 TABLET BY MOUTH EVERY DAY 90 tablet 2  . fish oil-omega-3 fatty acids 1000 MG capsule Take 1 g by mouth daily.      Marland Kitchen losartan-hydrochlorothiazide (HYZAAR) 100-12.5 MG tablet TAKE 1 TABLET BY MOUTH EVERY DAY 90 tablet 0  . Multiple Vitamin (MULTIVITAMIN) tablet Take 1 tablet by mouth daily.      . simvastatin (ZOCOR) 40 MG tablet TAKE 1 TABLET BY MOUTH EVERY NIGHT AT BEDTIME 90 tablet 1   No current facility-administered medications for this visit.     Past Medical History  Diagnosis Date  . Allergic rhinitis   . HTN (hypertension)   . Aortic insufficiency     mild... echo... 02/2010  . Mitral regurgitation     mild... echo.. 02/2010  . LVH (left ventricular hypertrophy)     moderate... echo... 02/2010  . Diastolic dysfunction     EF 50-55%... echo.. 02/2010 (55-60% echo 2009)  . Incomplete RBBB     IRBBB; sinus bradycardia w/ PVCs  . Sinus bradycardia   . PVC (premature ventricular contraction)   .  Atrial flutter ([place]) 02/03/2010    Consult by Dr. Lovena Le... June, 2011.... plan rate control...  Coumadin not needed... ablation if rate not controlled well  . Dyslipidemia   . Hemorrhoids   . Renal calculus   . Degenerative joint disease   . Anxiety   . Basal cell carcinoma   . Ejection fraction     EF 50-55%, echo, June, 2011  . Chest pain     Nuclear, March, 2010, no ischemia  . Allergy     ROS:   All systems reviewed and negative except as noted in the HPI.   Past Surgical History  Procedure Laterality Date  . Kidney stone surgery    . Colonoscopy    . Polypectomy       Family History  Problem Relation Age of Onset  . Hypertension Mother   . Colon cancer Paternal Aunt   . Colon cancer Paternal Uncle   . Stroke Mother 82     Social History   Social History  . Marital Status: Married    Spouse Name: Austen Nakatani  . Number of Children: 2  . Years of Education: N/A   Occupational History  . Not on file.   Social History Main Topics  . Smoking status: Never Smoker   . Smokeless tobacco: Never Used  .  Alcohol Use: 1.8 oz/week    3 Glasses of wine per week     Comment: 3 glasses of wine per week  . Drug Use: No  . Sexual Activity: Not on file   Other Topics Concern  . Not on file   Social History Narrative     BP 120/74 mmHg  Pulse 40  Ht 6' (1.829 m)  Wt 175 lb 3.2 oz (79.47 kg)  BMI 23.76 kg/m2  Physical Exam:  Well appearing 74 yo man, NAD HEENT: Unremarkable Neck:  6 cm JVD, no thyromegally Lymphatics:  No adenopathy Back:  No CVA tenderness Lungs:  Clear with no wheezes.  HEART:  Regular brady rhythm, no murmurs, no rubs, no clicks Abd:  soft, positive bowel sounds, no organomegally, no rebound, no guarding Ext:  2 plus pulses, no edema, no cyanosis, no clubbing Skin:  No rashes no nodules Neuro:  CN II through XII intact, motor grossly intact  ECG - sinus bradycardia at 40/min.   Assess/Plan:

## 2015-08-20 ENCOUNTER — Other Ambulatory Visit: Payer: Self-pay | Admitting: Pulmonary Disease

## 2015-09-21 ENCOUNTER — Other Ambulatory Visit: Payer: Self-pay | Admitting: Pulmonary Disease

## 2015-09-21 ENCOUNTER — Other Ambulatory Visit: Payer: Self-pay

## 2015-09-21 DIAGNOSIS — I10 Essential (primary) hypertension: Secondary | ICD-10-CM

## 2015-11-11 DIAGNOSIS — H52203 Unspecified astigmatism, bilateral: Secondary | ICD-10-CM | POA: Diagnosis not present

## 2015-11-11 DIAGNOSIS — H524 Presbyopia: Secondary | ICD-10-CM | POA: Diagnosis not present

## 2015-11-11 DIAGNOSIS — D3132 Benign neoplasm of left choroid: Secondary | ICD-10-CM | POA: Diagnosis not present

## 2015-11-11 DIAGNOSIS — H43811 Vitreous degeneration, right eye: Secondary | ICD-10-CM | POA: Diagnosis not present

## 2015-11-11 DIAGNOSIS — H25813 Combined forms of age-related cataract, bilateral: Secondary | ICD-10-CM | POA: Diagnosis not present

## 2015-11-21 ENCOUNTER — Other Ambulatory Visit: Payer: Self-pay | Admitting: Pulmonary Disease

## 2015-12-15 DIAGNOSIS — L570 Actinic keratosis: Secondary | ICD-10-CM | POA: Diagnosis not present

## 2015-12-15 DIAGNOSIS — Z85828 Personal history of other malignant neoplasm of skin: Secondary | ICD-10-CM | POA: Diagnosis not present

## 2015-12-15 DIAGNOSIS — Z08 Encounter for follow-up examination after completed treatment for malignant neoplasm: Secondary | ICD-10-CM | POA: Diagnosis not present

## 2016-01-17 ENCOUNTER — Ambulatory Visit: Payer: Medicare Other | Admitting: Pulmonary Disease

## 2016-01-24 ENCOUNTER — Ambulatory Visit: Payer: Medicare Other | Admitting: Pulmonary Disease

## 2016-01-25 ENCOUNTER — Other Ambulatory Visit: Payer: Self-pay | Admitting: Pulmonary Disease

## 2016-02-06 ENCOUNTER — Other Ambulatory Visit (INDEPENDENT_AMBULATORY_CARE_PROVIDER_SITE_OTHER): Payer: Medicare Other

## 2016-02-06 ENCOUNTER — Ambulatory Visit (INDEPENDENT_AMBULATORY_CARE_PROVIDER_SITE_OTHER): Payer: Medicare Other | Admitting: Pulmonary Disease

## 2016-02-06 ENCOUNTER — Encounter: Payer: Self-pay | Admitting: Pulmonary Disease

## 2016-02-06 VITALS — BP 122/66 | HR 80 | Ht 72.0 in | Wt 172.2 lb

## 2016-02-06 DIAGNOSIS — I351 Nonrheumatic aortic (valve) insufficiency: Secondary | ICD-10-CM | POA: Diagnosis not present

## 2016-02-06 DIAGNOSIS — I340 Nonrheumatic mitral (valve) insufficiency: Secondary | ICD-10-CM

## 2016-02-06 DIAGNOSIS — Z860100 Personal history of colon polyps, unspecified: Secondary | ICD-10-CM

## 2016-02-06 DIAGNOSIS — F411 Generalized anxiety disorder: Secondary | ICD-10-CM

## 2016-02-06 DIAGNOSIS — M159 Polyosteoarthritis, unspecified: Secondary | ICD-10-CM

## 2016-02-06 DIAGNOSIS — E785 Hyperlipidemia, unspecified: Secondary | ICD-10-CM

## 2016-02-06 DIAGNOSIS — I480 Paroxysmal atrial fibrillation: Secondary | ICD-10-CM | POA: Diagnosis not present

## 2016-02-06 DIAGNOSIS — I38 Endocarditis, valve unspecified: Secondary | ICD-10-CM | POA: Diagnosis not present

## 2016-02-06 DIAGNOSIS — Z87442 Personal history of urinary calculi: Secondary | ICD-10-CM

## 2016-02-06 DIAGNOSIS — I10 Essential (primary) hypertension: Secondary | ICD-10-CM

## 2016-02-06 DIAGNOSIS — R251 Tremor, unspecified: Secondary | ICD-10-CM

## 2016-02-06 DIAGNOSIS — M150 Primary generalized (osteo)arthritis: Secondary | ICD-10-CM

## 2016-02-06 LAB — PSA: PSA: 2.24 ng/mL (ref 0.10–4.00)

## 2016-02-06 LAB — CBC WITH DIFFERENTIAL/PLATELET
BASOS PCT: 0.7 % (ref 0.0–3.0)
Basophils Absolute: 0.1 10*3/uL (ref 0.0–0.1)
EOS PCT: 1.3 % (ref 0.0–5.0)
Eosinophils Absolute: 0.1 10*3/uL (ref 0.0–0.7)
HEMATOCRIT: 49.7 % (ref 39.0–52.0)
Hemoglobin: 16.7 g/dL (ref 13.0–17.0)
LYMPHS ABS: 2.1 10*3/uL (ref 0.7–4.0)
LYMPHS PCT: 29.1 % (ref 12.0–46.0)
MCHC: 33.7 g/dL (ref 30.0–36.0)
MCV: 89 fl (ref 78.0–100.0)
MONOS PCT: 7.1 % (ref 3.0–12.0)
Monocytes Absolute: 0.5 10*3/uL (ref 0.1–1.0)
NEUTROS ABS: 4.5 10*3/uL (ref 1.4–7.7)
NEUTROS PCT: 61.8 % (ref 43.0–77.0)
PLATELETS: 164 10*3/uL (ref 150.0–400.0)
RBC: 5.59 Mil/uL (ref 4.22–5.81)
RDW: 13.9 % (ref 11.5–15.5)
WBC: 7.4 10*3/uL (ref 4.0–10.5)

## 2016-02-06 LAB — LIPID PANEL
Cholesterol: 120 mg/dL (ref 0–200)
HDL: 58 mg/dL (ref >39.00)
LDL Cholesterol: 49 mg/dL (ref 0–99)
NONHDL: 62.14
Total CHOL/HDL Ratio: 2
Triglycerides: 66 mg/dL (ref 0.0–149.0)
VLDL: 13.2 mg/dL (ref 0.0–40.0)

## 2016-02-06 LAB — COMPREHENSIVE METABOLIC PANEL
ALK PHOS: 67 U/L (ref 39–117)
ALT: 23 U/L (ref 0–53)
AST: 22 U/L (ref 0–37)
Albumin: 5 g/dL (ref 3.5–5.2)
BILIRUBIN TOTAL: 1.3 mg/dL — AB (ref 0.2–1.2)
BUN: 16 mg/dL (ref 6–23)
CO2: 32 mEq/L (ref 19–32)
Calcium: 10 mg/dL (ref 8.4–10.5)
Chloride: 101 mEq/L (ref 96–112)
Creatinine, Ser: 0.85 mg/dL (ref 0.40–1.50)
GFR: 93.23 mL/min (ref >60.00)
GLUCOSE: 112 mg/dL — AB (ref 70–99)
Potassium: 3.7 mEq/L (ref 3.5–5.1)
SODIUM: 141 meq/L (ref 135–145)
TOTAL PROTEIN: 7.5 g/dL (ref 6.0–8.3)

## 2016-02-06 LAB — TSH: TSH: 1.12 u[IU]/mL (ref 0.35–4.50)

## 2016-02-06 NOTE — Patient Instructions (Signed)
Today we updated your med list in our EPIC system...    Continue your current medications the same...  We will arrange for a Neurology evaluation for your tremor...  02/06/2016 we checked your follow up FASTING  Blood work...    We will contact you w/ the results when available...   Call for any questions...  Let's plan a follow up visit in 20mo, sooner if needed for problems.Marland KitchenMarland Kitchen

## 2016-02-06 NOTE — Progress Notes (Signed)
Subjective:    Patient ID: Carlos Carlos [name], male    DOB: [birth year], 75 y.o.   MRN: [id number]  HPI 75 y/o WM here for a follow up visit... he has multiple medical problems as noted below...   ~  SEE PREV EPIC NOTES FOR OLDER DATA >>    CXR 5/14 showed heart at upper lim of norm, mild atherosclerotic changes in Ao, bilat nipple shadows, NAD...  LABS 5/14:  FLP- at goals on simva40;  Chems- wnl;  CBC- wnl x plat=130K;  TSH=1.04;  PSA=1.70...  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    HBP> on Aten50, Plendil10, Hyzaar100-12.5;  BP= 118/64 & denies CP, palpit, dizzy, SOB, edema, etc...    Valv Heart Dis> on Amoxicillin Prn for SBE prophy; he had f/u 5/15 [name]- PAFlutter, mild AI&MR, doing satis/ no change-same meds...    AFlutter, RBBB> on ASA81; denies CP, palpit, dizzy, etc; he had f/u [name] 5/13- doing Carlos [name] & no need for pacer...    Chol> on Simva40, FishOil; on diet & wt stable- FLP 5/15 showed TChol 97, TG 20, HDL 51, LDL 42    GI- polyps, hems> he remains asymptomatic w/o abd pain, n/v, c/d, blood seen; last colon 12/10 w/ divertics, 350madenoma, & f/u 5y59yr.    Kidney stones> he remains asymptomatic w/o pain, dysuria, blood, etc...    DJD> on OTC analgesics as needed; he continues to work hard, min arthritic symptoms, using OTC meds as needed...    Anxiety> doing satis, not on meds...    Skin cancer> he continues to f/u w/ Derm Q3mo75moWe reviewed prob list, meds, xrays and labs> see below for updates >>   EKG 5/15 showed SBrady, rate46, RBBB, NAD...  LABS 5/15:  FLP- at goals on Simva40;  Chems- wnl;  CBC- wnl;  TSH=0.85;  PSA=1.88  ~  July 20, 2014:  38mo 40mo& Carlos Carlos [name] to do Carlos [name]- no new  complaints or concerns;  He saw [name] several weeks ago- Hx SBrady & Aflutter, RBBB; doing Carlos [name] w/o symptoms but BP on the low end so they cut back on his Atenolol50 to 1/2 tab daily...     BP remains on low side w/ Aten50-1/2, Felodip10, Hyzaar100-12.5; BP= 102/62 today & we decided to decr the Hyzaar to 1/2 tab daily; he will monitor BP at home...    He remains on Simva40 + FishOil and last FLP 5/15 looked great, continue same...     ROS is otherw neg- he continues f/u w/ Derm every 3-38mo..38mo reviewed prob list, meds, xrays and labs> see below for updates >> He had the 2015 Flu vaccine already; given PREVNAR-13 today...   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    HBP>  on Aten50-1/2, Plendil10, Hyzaar100/12.5-taking 1/2;  BP= 104/60 & denies CP, palpit, dizzy, SOB, edema, etc...    Valv Heart Dis> on Amoxicillin Prn for SBE prophy; he had f/u 5/15 [name]- PAFlutter, mild AI&MR, doing satis/ no change-same meds...    AFlutter, RBBB> on ASA81; denies CP, palpit, dizzy, etc; he had f/u [name] 5/15- doing Carlos [name] & no need for pacer...    Chol> on Simva40, FishOil; on diet & wt stable- FLP 5/16 showed TChol 116, TG 48, HDL 65, LDL 41; continue same...    GI- polyps, hems> he remains asymptomatic w/o abd pain, n/v, c/d, blood seen; last colon 12/10 w/ divertics, 33m adenoma, & f/u 540yr..    Kidney stones> he had stone in the 1970's that required surg; last stone was 7/14 & he passed it; he remains asymptomatic w/o pain, dysuria, blood, etc;    DJD> on OTC analgesics as needed; he continues to work hard, min arthritic symptoms,  using OTC meds as needed...    Anxiety> doing satis, not on meds...    Skin cancer> he continues to f/u w/ Derm Q3m40mo We reviewed prob list, meds, xrays and labs> see below for updates >>   CXR 5/16 showed norm heart size, clear lungs, NAD...  LABS 5/16:  FLP- at goals on Simva40;  Chems- wnl x BS=121 (diet rx);  CBC- wnl;  TSH=1.16;  PSA=2.10... PLAN>> stable overall, continue same meds & watch BP at home (may need to decr the Plendil if BP on low end)...  ~  May 03, 2015:  Add-on appt requested by pt for "BP & heart rate"> he states that he usually checks BP/HR every day & it has maintained in a narrow range w/ BP=100-110 sys and HR=40s-50s on his meds; [place]; [place] BP was elevated in the 120-130 range and pulse up to 85/min which really concerned him; he denies CP, palpit, dizzy, SOB/DOE, edema, etc; CV hx follows>     HBP> on Aten50-1/2, Plendil10, Hyzaar100/12.5-taking 1/2;  BP= 116/70 & pulse=82/min but irreg; denies CP, palpit, dizzy, SOB, edema, etc...    Valv Heart Dis> on Amoxicillin Prn for SBE prophy; he last saw [name] 5/15> HxPAFlutter, mild AI&MR (last Echo 2011), doing satis/ no change- same meds...    HxAFlutter, RBBB> on ASA81; denies CP, palpit, dizzy, etc; he had f/u [name]11/15- doing Carlos [name], pulse was in 40s & Aten50 decr to 45m45m.. EXAM reveals Afeb, VSS but pulse irreg, O2sat=98% on RA;  HEENT- neg, clears throat a lot;  Chest- clear w/o w/r/r;  Heart- irreg irreg gr1/MB5/5 & diastolic murmur at base;  Abd- soft, neg;  Ext- neg w/o c/c/e...  EKG shows AFib w/ controlled HR~80, one PVC noted, IRBBB, sl incr voltage... IMP/PLAN>>  I explained to the pt & his wife that his seemingly sudden incr in his baseline heart rate was related to his developing atrial fibrillation w/ variable VR;  He prev had AFlutter (last 2011- see below & notes from [name]&Taylor);  I discussed the case  w/ [name] by phone & he rec starting anticoag w/ Eliquis5mgB26m continue his other meds the same, and they will call w/ time for a f/u appt w/ 2DEcho... CHADsVASc=2-3 (he will be 74 in 46mo, 646mo).  ~  July 20, 2015:  2-87mo [id number] is here due to wife's concern regarding his hands shaking;  At his last OV he had recurrent PAF  that pt noted on his fitbit because his usual HR in the 50s jumped up to the 80s & we added Eliquis5Bid (CHADSVASC- 3) & referred pt to [name];  He spont converted to NSR, he was seen in the [facility] 9/1- back in sinus brady, IRBBB;  They checked f/u 2DEcho (see below) & no change in meds- he denies any further episodes of incr HR and he never really had any CP/ palpit/ dizzy/ etc... Pt notes some arthritis, eg-shoulders and uses Advil, he declines Ortho...    HBP> on Aten50-1/2, Plendil10, Hyzaar100/12.5-taking 1/2;  BP= 116/68 & pulse=50 & reg; denies CP, palpit, dizzy, SOB, edema, etc...    Valv Heart Dis> on Amoxicillin Prn for SBE prophy; he last saw [name] 5/15> HxPAFlutter, mild AI&MR (last Echo 2011), doing satis/ no change- same meds...    HxAFib/Flutter, RBBB> on Eliquis5Bid; denies CP, palpit, dizzy, etc; he had f/u [name] in the past, now [name] & AF clinic... EXAM reveals Afeb, VSS & pulse reg/50, O2sat=97% on RA;  HEENT- neg, clears throat a lot;  Chest- clear w/o w/r/r;  Heart- reg KV4/2 SEM & diastolic murmur at base;  Abd- soft, neg;  Ext- neg w/o c/c/e; Neuro- intact, no trmor noted...  2DEcho 05/11/15 showed norm LV size & function w/ EF=60-65%, no regional wall motion abn, AoV mildly thickened & calcif w/ mod AI, Asc Ao is mildly dilated, mild post leaflet of MV prolapse w/ mild MR, mod LA dil at 52m, & mod pulmonic regurg. IMP/PLAN>>  Problems as noted- PAF/flutter controlled on meds and now on Eliquis per [name] & AF clinic (note- he has valvular heart dis);  I do not note any hand shaking/ tremor today & we will continue on observation, plan ROV in  685mo/ follow up labs etc...   ~  February 06, 2016:  6-52m98moV & JamJayvien stable overall but mentons an intermittent tremor in his right hand, this could be early Parkinson's dis & we will refer to Neuro for their consultation;      Allergy to fire ants> he has Epi-pen for prn use...    HBP> on Aten50-1/2, Plendil10, Hyzaar100/12.5-taking 1/2;  BP= 122/66 & denies CP, palpit, dizzy, SOB, edema, etc...    Valv Heart Dis> on Amoxicillin Prn for SBE prophy; he had f/u 5/15 [name]- PAFlutter, mild AI&MR, doing satis/ no change-same meds...    AFlutter, RBBB> on ASA81; denies CP, palpit, dizzy, etc; he had f/u [name] 5/15- doing Carlos [name] & no need for pacer...    Chol> on Simva40, FishOil; on diet & wt stable- FLP 6/17 showed TChol 120, TG 66, HDL 58, LDL 49; continue same...    GI- polyps, hems> he remains asymptomatic w/o abd pain, n/v, c/d, blood seen; last colon 12/10 w/ divertics, 3mm26menoma, & f/u 31yrs103yr   Kidney stones> he had stone in the 1970's that required surg; last stone was 7/14 & he passed it; he remains asymptomatic w/o pain, dysuria, blood, etc;    DJD> on OTC analgesics as needed; he continues to work hard, min arthritic symptoms, using OTC meds as needed...    Tremor> we will refer to Neuro to r/o early Parkinsons...    Anxiety> doing satis, not on meds...    Skin cancer> he continues to f/u w/ Derm Q3mo..20moAM reveals Afeb, VSS, O2sat=99% on RA;  HEENT- neg, clears throat a lot;  Chest- clear w/o w/r/r;  Heart- reg gr1/6 VZ5/6 diastolic murmur at base;  Abd- soft,  neg;  Ext- neg w/o c/c/e; Neuro- intact, no trmor noted...  LABS 02/06/16>  FLP- at goals on Simva40;  Chems- wnl x BS=112;  CBC- wnl w/ Hg=16.7;  TSH=1.12;  PSA=2.24 IMP/PLAN>>  Rayne is stable overall but notes tremor in right hand that could be early Parkinsons- refer to Neuro; continue other meds the same...           Problem List:  HYPERTENSION (ICD-401.9) - controlled on ATENOLOL '50mg'$ /d,  FELODIPINE '10mg'$ /d, &  LOSARTAN/HCT 100-12.5 daily... ~  see 2DEcho below> ~  CXR 4/11 showed borderline cardiomeg, clear lungs, DJD spine, NAD;  CXR 4/12 same> NAD, WNL... ~  10/11:   BP= 110/66 & similar at home; pt denies HA, fatigue, visual changes, CP, palipit, dizziness, syncope, dyspnea, edema, etc... ~  4/12:  BP= 110/84 & he remains asymptomatic... We decided to decr the ATENOLOL from '50mg'$  to '25mg'$ /d due to his bradycardia... ~  CXR 4/12 showed normal heart size, clear lungs, NAD... ~  10/12:  BP= 120/72, Pulse= 50 & regular; he went back on his Aten50/d; he remains asymptomatic w/o CP, palpit, dizzy, syncope, SOB, edema, etc... ~  5/13:  BP= 122/80 & he remains bradycardic but doesn't want to cut the BBlocker; denies CP, palpit, dizzy, syncope, SOB, etc. ~  11/13: on Aten50, Plendil10, Hyzaar100-12.5;  BP= 112/82 & denies CP, palpit, dizzy, SOB, edema, etc... ~  5/14: on Aten50, Plendil10, Hyzaar100-12.5;  BP= 118/58 & denies CP, palpit, dizzy, SOB, edema, etc ~  CXR 5/14 showed heart at upper lim of norm, mild atherosclerotic changes in Ao, bilat nipple shadows, NAD ~  11/14: He remains on ASA81, Aten50, Plendil10, Hyzaar100-12.5 and BP is Carlos [name] controlled at 110/66 today; he feels Carlos [name] w/o CP, palpit, dizzy, SOB, edema, etc. ~  5/15: on Aten50, Plendil10, Hyzaar100-12.5;  BP= 118/64 & denies CP, palpit, dizzy, SOB, edema, etc ~  11/15: on Aten50-1/2, Felodip10, Hyzaar100-12.5; BP= 102/62 today & we decided to decr the Hyzaar to 1/2 tab daily; he will monitor BP at home ~  5/16: on Aten50-1/2, Plendil10, Hyzaar100/12.5-taking 1/2;  BP= 104/60 & he remains asymptomatic... ~  11/16: on same meds w/ good control of BP.  VALVULAR HEART DISEASE (ICD-424.90) - followed by [name]/Taylor- pt knows to take AMOX prior to dental work...  ~  2DEcho in 2002 w/ mild AI, MR, TR and normal LVF... ~  2DEcho 2/09 showed LV sl dilated w/ mild DD, norm LVF w/o regional wall motion abn & EF= 55-60%;  mild AI, mild-mod MR, dil LA @  27m... ~  NuclearStressTest 3/10 showed mild inferobas thinning, no ischemia, EF=48% w/ mild global HK... ~  2DEcho 4/10 showed mild AI & MR, mod dil LA, norm LVF w/ EF= 55-60% (Myoview= false result). ~  2DEcho 6/11 showed mod conc LVH, norm wall motion & EF= 50-55%, mild AI, mild thick MV leaflets w/ mild MR, mod LAdil= 45m ~  [facility]12 & [facility]13[facility]> doing satis, not on anticoagulation, no changes made... ~   EKG 5/15 showed SBrady, rate46, RBBB, NAD...  ~  11/15: he had f/u [name]- doing satis, asymptomatic but BP was low at 108/60 so they decr the aten50 to 1/2 tab daily... ~  11/16: he had 2DEcho 9/16 at [facility]- norm LV size & function w/ EF=60-65%, no regional wall motion abn, AoV mildly thickened & calcif w/ mod AI, Asc Ao is mildly dilated, mild post leaflet of MV prolapse w/ mild MR, mod LA dil  at 55m, & mod pulmonic regurg.  AFIB >> ATRIAL FLUTTER (ICD-427.32) & BUNDLE BRANCH BLOCK, RIGHT (ICD-426.4) - on ASA '325mg'$ /d and rate control strategy...  ~  prev EKG's w/ IRBBB...2/09 w/ IVCD, NSR, & QS wave in III and aVF (new)... check LVF (nl) and wall motion (nl) on echo... ~  he developed AFlutter 2011 & had evals from [name] 7/11 & [name] 10/11-  they rec rate control & ASA, w/ consideration of cath ablation if not controlled... ~  2DEcho 6/11 showed mod conc LVH, norm wall motion & EF= 50-55%, mild AI, mild thick MV leaflets w/ mild MR, mod LAdil= 47m   ~  Holter 6/11 revealed AFlutter (ave rate 106, max=145, no pauses)... ~  5/13: he saw [name]- doing Carlos [name], no symptoms, no indication for pacer...  ~  EKG 11/13 showed sinus brady, rate48, rsr', otherw wnl... ~   EKG 5/15 showed SBrady, rate46, RBBB, NAD...Marland Kitchen  ~  8/16: he presented w/ increased HR (noted pulse up to 80's & irreg)=> EKG w/ AFib & controlled VR where prev he'd been in SinusBrady w/ pulse~50; denied CP/ palpit/ SOB/ edema; started on Eliquis & referred to Cards for f/u & 2DEcho. ~  9/16: seen in  [facility]inic & had spont converted to NSR- ASA changed to Eliquis5Bid & continued on other meds the same...  HYPERCHOLESTEROLEMIA (ICD-272.0) - on SIMVASTATIN '40mg'$ /d, Fish Oil, & diet...  ~  [place] showed TChol 106, TG 59, HDL 48, LDL46 ~  FLP 2/09 showed TChol 123, TG 57, HDL 51, LDL 61 ~  FLP 2/10 showed TChol 117, TG 47, HDL 55, LDL 53 ~  FLP 4/11 showed TChol 105, TG 27, HDL 57, LDL 43 ~  FLP 4/12 on Simva40 showed TChol 104, Tg 34, HDL 51, LDL 46 ~  FLP 5/13 on Simva40 showed TChol 112, TG 39, HDL 55, LDL 49 ~  FLP 5/14 on Simva40 showed TChol 113, TG 42, HDL 52, LDL 53 ~  FLP 5/15 on Simva40 showed  TChol 97, TG 20, HDL 51, LDL 42 ~  FLP 5/16 on Simva40 showed  TChol 116, TG 48, HDL 65, LDL 41  Hx of HEMORRHOIDS (ICD-455.6) - [place] by DrNichols was neg x hems... rec f/u 5 yrs due to fam history & he asks to be seen here (refer to GI)... ~  12/10: had f/u colonoscopy by DrStark- 71m37messile polyp in cecum= path showed fragments of tubular adenoma; +mild divertics...   RENAL CALCULUS, HX OF (ICD-V13.01) - surg in 1974 for kid stone...  ~  7/14:  CT Abd & Pelvis showed enhancement of the left kidney w/ marked perineph stranding, mod left hydroureteronephrosis, 4mm77mft UVJ stone, tortuous left ureter, enlarged prostatelumbar DJD...  DEGENERATIVE JOINT DISEASE (ICD-715.90) - occas neck discomfort without arm pain, numbness, etc... he is very active...  TREMOR >> noted tremor of right hand 02/2016 & refer to Neuro to r/o Parkinsons...  ANXIETY (ICD-300.00)  BASAL CELL CARCINOMA, HX OF (ICD-V10.83) - he [place]/ [place]16month 8/12: note from DrWHoover- full body check & several AKs removed... ~  He continues to f/u w/ Derm every 71mo h60moys...  Health Maintenance: ~  GI:  DrStark w/ colon 12/10 as above... ~  GU:  4/11> DRE= neg & PSA= 1.50 ~  Immunizations:  he gets yearly Flu shots,  ?Pneumovax,  Given TDAP 5/13...   Past Surgical  History  Procedure Laterality Date  .  Kidney stone surgery    . Colonoscopy    . Polypectomy      Outpatient Encounter Prescriptions as of 02/06/2016  Medication Sig  . amoxicillin (AMOXIL) 500 MG capsule 4 tabs by mouth 1 hr prior to dental procedures.  Marland Kitchen atenolol (TENORMIN) 50 MG tablet Take 25 mg by mouth daily.  Marland Kitchen atenolol (TENORMIN) 50 MG tablet TAKE 1 TABLET BY MOUTH DAILY  . ELIQUIS 5 MG TABS tablet TAKE 1 TABLET BY MOUTH TWICE DAILY  . felodipine (PLENDIL) 10 MG 24 hr tablet TAKE 1 TABLET BY MOUTH EVERY DAY  . fish oil-omega-3 fatty acids 1000 MG capsule Take 1 g by mouth daily.    Marland Kitchen losartan-hydrochlorothiazide (HYZAAR) 100-12.5 MG tablet TAKE 1 TABLET BY MOUTH EVERY DAY  . Multiple Vitamin (MULTIVITAMIN) tablet Take 1 tablet by mouth daily.    . simvastatin (ZOCOR) 40 MG tablet TAKE 1 TABLET BY MOUTH EVERY NIGHT AT BEDTIME   No facility-administered encounter medications on file as of 02/06/2016.    No Known Allergies   Current Medications, Allergies, Past Medical History, Past Surgical History, Family History, and Social History were reviewed in [employer] record.    Review of Systems         See HPI - all other systems neg except as noted... The patient denies anorexia, fever, weight loss, weight gain, vision loss, decreased hearing, hoarseness, chest pain, syncope, dyspnea on exertion, peripheral edema, prolonged cough, headaches, hemoptysis, abdominal pain, melena, hematochezia, severe indigestion/heartburn, hematuria, incontinence, muscle weakness, suspicious skin lesions, transient blindness, difficulty walking, depression, unusual weight change, abnormal bleeding, enlarged lymph nodes, and angioedema.    Objective:   Physical Exam    WD, WN, 75 y/o WM in NAD... GENERAL:  Alert & oriented; pleasant & cooperative... HEENT:  [place]/AT, EOM-wnl, PERRLA, Fundi-benign, EACs-clear, TMs-wnl, NOSE-clear, THROAT-clear & wnl. NECK:  Supple w/ fairROM; no  JVD; normal carotid impulses w/o bruits; no thyromegaly or nodules palpated; no lymphadenopathy. CHEST:  Clear to P & A; without wheezes/ rales/ or rhonchi. HEART:  regular rhythm;  gr1/6 SEM LSB & OZ3/6 diastolic murmur in aortic area, without rubs or gallops heard... ABDOMEN:  Soft & nontender; normal bowel sounds; no organomegaly or masses detected. (RECTAL:  Neg - prostate 2+ & nontender w/o nodules; stool hematest neg) EXT: without deformities or arthritic changes; no varicose veins/ venous insuffic/ or edema. NEURO:  CN's intact; motor testing normal x mild tremor right hand; sensory testing normal; gait normal & balance OK. DERM:  No lesions noted; no rash etc...  RADIOLOGY DATA:  Reviewed in the EPIC EMR & discussed w/ the patient...  LABORATORY DATA:  Reviewed in the EPIC EMR & discussed w/ the patient...    Assessment & Plan:    HBP>  BP remains on the low side despite decr Aten50&Hyzaar to 1/2 tab/d; REC to continue same for now & watch BP...  Hx AFib/Flutter, RBBB, Valv heart dis>  Followed by [name] & AFclinic  CHOL>  Stable on diet + Simva40; continue same...  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  Other medical issues as noted...  No tremor noted & we will continue to observe...    Patient's Medications  New Prescriptions   No medications on file  Previous Medications   AMOXICILLIN (AMOXIL) 500 MG CAPSULE    4 tabs by mouth 1 hr prior to dental procedures.   ATENOLOL (TENORMIN) 50 MG TABLET    Take  25 mg by mouth daily.   ATENOLOL (TENORMIN) 50 MG TABLET    TAKE 1 TABLET BY MOUTH DAILY   ELIQUIS 5 MG TABS TABLET    TAKE 1 TABLET BY MOUTH TWICE DAILY   FELODIPINE (PLENDIL) 10 MG 24 HR TABLET    TAKE 1 TABLET BY MOUTH EVERY DAY   FISH OIL-OMEGA-3 FATTY ACIDS 1000 MG CAPSULE    Take 1 g by mouth daily.     LOSARTAN-HYDROCHLOROTHIAZIDE (HYZAAR) 100-12.5 MG TABLET    TAKE 1 TABLET BY MOUTH EVERY DAY   MULTIPLE  VITAMIN (MULTIVITAMIN) TABLET    Take 1 tablet by mouth daily.     SIMVASTATIN (ZOCOR) 40 MG TABLET    TAKE 1 TABLET BY MOUTH EVERY NIGHT AT BEDTIME  Modified Medications   No medications on file  Discontinued Medications   No medications on file

## 2016-02-06 NOTE — Progress Notes (Signed)
Subjective:    Patient ID: Carlos Carlos [name], male    DOB: May 21, 1941, 75 y.o.   MRN: [id number]  HPI 75 y/o WM here for a follow up visit... Carlos [name] has multiple medical problems as noted below...   ~  SEE PREV EPIC NOTES FOR OLDER DATA >>    CXR 5/14 showed heart at upper lim of norm, mild atherosclerotic changes in Ao, bilat nipple shadows, NAD...  LABS 5/14:  FLP- at goals on simva40;  Chems- wnl;  CBC- wnl x plat=130K;  TSH=1.04;  PSA=1.70...  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    HBP> on Aten50, Plendil10, Hyzaar100-12.5;  BP= 118/64 & denies CP, palpit, dizzy, SOB, edema, etc...    Valv Heart Dis> on Amoxicillin Prn for SBE prophy; Carlos [name] had f/u 5/15 [name]- PAFlutter, mild AI&MR, doing satis/ no change-same meds...    AFlutter, RBBB> on ASA81; denies CP, palpit, dizzy, etc; Carlos [name] had f/u [name] 5/13- doing well & no need for pacer...    Chol> on Simva40, FishOil; on diet & wt stable- FLP 5/15 showed TChol 97, TG 20, HDL 51, LDL 42    GI- polyps, hems> Carlos [name] remains asymptomatic w/o abd pain, n/v, c/d, blood seen; last colon 12/10 w/ divertics, 352madenoma, & f/u 5y38yr.    Kidney stones> Carlos [name] remains asymptomatic w/o pain, dysuria, blood, etc...    DJD> on OTC analgesics as needed; Carlos [name] continues to work hard, min arthritic symptoms, using OTC meds as needed...    Anxiety> doing satis, not on meds...    Skin cancer> Carlos [name] continues to f/u w/ Derm Q3mo58moWe reviewed prob list, meds, xrays and labs> see below for updates >>   EKG 5/15 showed SBrady, rate46, RBBB, NAD...  LABS 5/15:  FLP- at goals on Simva40;  Chems- wnl;  CBC- wnl;  TSH=0.85;  PSA=1.88  ~  July 20, 2014:  97mo 2mo& Carlos Carlos [name] to do well- no new  complaints or concerns;  Carlos [name] saw [name] several weeks ago- Hx SBrady & Aflutter, RBBB; doing well w/o symptoms but BP on the low end so they cut back on his Atenolol50 to 1/2 tab daily...     BP remains on low side w/ Aten50-1/2, Felodip10, Hyzaar100-12.5; BP= 102/62 today & we decided to decr the Hyzaar to 1/2 tab daily; Carlos [name] will monitor BP at home...    Carlos [name] remains on Simva40 + FishOil and last FLP 5/15 looked great, continue same...     ROS is otherw neg- Carlos [name] continues f/u w/ Derm every 3-97mo..497mo reviewed prob list, meds, xrays and labs> see below for updates >> Carlos [name] had the 2015 Flu vaccine already; given PREVNAR-13 today...   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    HBP>  on Aten50-1/2, Plendil10, Hyzaar100/12.5-taking 1/2;  BP= 104/60 & denies CP, palpit, dizzy, SOB, edema, etc...    Valv Heart Dis> on Amoxicillin Prn for SBE prophy; Carlos [name] had f/u 5/15 [name]- PAFlutter, mild AI&MR, doing satis/ no change-same meds...    AFlutter, RBBB> on ASA81; denies CP, palpit, dizzy, etc; Carlos [name] had f/u [name] 5/15- doing well & no need for pacer...    Chol> on Simva40, FishOil; on diet & wt stable- FLP 5/16 showed TChol 116, TG 48, HDL 65, LDL 41; continue same...    GI- polyps, hems> Carlos [name] remains asymptomatic w/o abd pain, n/v, c/d, blood seen; last colon 12/10 w/ divertics, 33m adenoma, & f/u 540yr..    Kidney stones> Carlos [name] had stone in the 1970's that required surg; last stone was 7/14 & Carlos [name] passed it; Carlos [name] remains asymptomatic w/o pain, dysuria, blood, etc;    DJD> on OTC analgesics as needed; Carlos [name] continues to work hard, min arthritic symptoms,  using OTC meds as needed...    Anxiety> doing satis, not on meds...    Skin cancer> Carlos [name] continues to f/u w/ Derm Q3m40mo We reviewed prob list, meds, xrays and labs> see below for updates >>   CXR 5/16 showed norm heart size, clear lungs, NAD...  LABS 5/16:  FLP- at goals on Simva40;  Chems- wnl x BS=121 (diet rx);  CBC- wnl;  TSH=1.16;  PSA=2.10... PLAN>> stable overall, continue same meds & watch BP at home (may need to decr the Plendil if BP on low end)...  ~  May 03, 2015:  Add-on appt requested by pt for "BP & heart rate"> Carlos [name] states that Carlos [name] usually checks BP/HR every day & it has maintained in a narrow range w/ BP=100-110 sys and HR=40s-50s on his meds; [name]; [place] noted BP was elevated in the 120-130 range and pulse up to 85/min which really concerned him; Carlos [name] denies CP, palpit, dizzy, SOB/DOE, edema, etc; CV hx follows>     HBP> on Aten50-1/2, Plendil10, Hyzaar100/12.5-taking 1/2;  BP= 116/70 & pulse=82/min but irreg; denies CP, palpit, dizzy, SOB, edema, etc...    Valv Heart Dis> on Amoxicillin Prn for SBE prophy; Carlos [name] last saw [name] 5/15> HxPAFlutter, mild AI&MR (last Echo 2011), doing satis/ no change- same meds...    HxAFlutter, RBBB> on ASA81; denies CP, palpit, dizzy, etc; Carlos [name] had f/u [name]11/15- doing well, pulse was in 40s & Aten50 decr to 45m45m.. EXAM reveals Afeb, VSS but pulse irreg, O2sat=98% on RA;  HEENT- neg, clears throat a lot;  Chest- clear w/o w/r/r;  Heart- irreg irreg gr1/MB5/5 & diastolic murmur at base;  Abd- soft, neg;  Ext- neg w/o c/c/e...  EKG shows AFib w/ controlled HR~80, one PVC noted, IRBBB, sl incr voltage... IMP/PLAN>>  I explained to the pt & his wife that his seemingly sudden incr in his baseline heart rate was related to his developing atrial fibrillation w/ variable VR;  Carlos [name] prev had AFlutter (last 2011- see below & notes from [name]&Taylor);  I discussed the case  w/ [name] by phone & Carlos [name] rec starting anticoag w/ Eliquis5mgB26m continue his other meds the same, and they will call w/ time for a f/u appt w/ 2DEcho... CHADsVASc=2-3 (Carlos [name] will be 74 in 46mo, 646mo).  ~  July 20, 2015:  2-87mo [id number] is here due to wife's concern regarding his hands shaking;  At his last OV Carlos [name] had recurrent PAF  that pt noted on his fitbit because his usual HR in the 50s jumped up to the 80s & we added Eliquis5Bid (CHADSVASC- 3) & referred pt to [name];  Carlos [name] spont converted to NSR, Carlos [name] was seen in the [facility] 9/1- back in sinus brady, IRBBB;  They checked f/u 2DEcho (see below) & no change in meds- Carlos [name] denies any further episodes of incr HR and Carlos [name] never really had any CP/ palpit/ dizzy/ etc... Pt notes some arthritis, eg-shoulders and uses Advil, Carlos [name] declines Ortho...    HBP> on Aten50-1/2, Plendil10, Hyzaar100/12.5-taking 1/2;  BP= 116/68 & pulse=50 & reg; denies CP, palpit, dizzy, SOB, edema, etc...    Valv Heart Dis> on Amoxicillin Prn for SBE prophy; Carlos [name] last saw [name] 5/15> HxPAFlutter, mild AI&MR (last Echo 2011), doing satis/ no change- same meds...    HxAFib/Flutter, RBBB> on Eliquis5Bid; denies CP, palpit, dizzy, etc; Carlos [name] had f/u [name] in the past, now [name] & AF clinic... EXAM reveals Afeb, VSS & pulse reg/50, O2sat=97% on RA;  HEENT- neg, clears throat a lot;  Chest- clear w/o w/r/r;  Heart- reg XB1/4 SEM & diastolic murmur at base;  Abd- soft, neg;  Ext- neg w/o c/c/e; Neuro- intact, no trmor noted...  2DEcho 05/11/15 showed norm LV size & function w/ EF=60-65%, no regional wall motion abn, AoV mildly thickened & calcif w/ mod AI, Asc Ao is mildly dilated, mild post leaflet of MV prolapse w/ mild MR, mod LA dil at 70m, & mod pulmonic regurg. IMP/PLAN>>  Problems as noted- PAF/flutter controlled on meds and now on Eliquis per [name] & AF clinic (note- Carlos [name] has valvular heart dis);  I do not note any hand shaking/ tremor today & we will continue on observation, plan ROV in  620mo/ follow up labs etc...           Problem List:  HYPERTENSION (ICD-401.9) - controlled on ATENOLOL '50mg'$ /d,  FELODIPINE '10mg'$ /d, & LOSARTAN/HCT 100-12.5 daily... ~  see 2DEcho below> ~  CXR 4/11 showed borderline cardiomeg, clear lungs, DJD spine, NAD;  CXR 4/12 same> NAD, WNL... ~  10/11:   BP= 110/66 & similar at home; pt denies HA, fatigue, visual changes, CP, palipit, dizziness, syncope, dyspnea, edema, etc... ~  4/12:  BP= 110/84 & Carlos [name] remains asymptomatic... We decided to decr the ATENOLOL from '50mg'$  to '25mg'$ /d due to his bradycardia... ~  CXR 4/12 showed normal heart size, clear lungs, NAD... ~  10/12:  BP= 120/72, Pulse= 50 & regular; Carlos [name] went back on his Aten50/d; Carlos [name] remains asymptomatic w/o CP, palpit, dizzy, syncope, SOB, edema, etc... ~  5/13:  BP= 122/80 & Carlos [name] remains bradycardic but doesn'Carlos [name] want to cut the BBlocker; denies CP, palpit, dizzy, syncope, SOB, etc. ~  11/13: on Aten50, Plendil10, Hyzaar100-12.5;  BP= 112/82 & denies CP, palpit, dizzy, SOB, edema, etc... ~  5/14: on Aten50, Plendil10, Hyzaar100-12.5;  BP= 118/58 & denies CP, palpit, dizzy, SOB, edema, etc ~  CXR 5/14 showed heart at upper lim of norm, mild atherosclerotic changes in Ao, bilat nipple shadows, NAD ~  11/14: Carlos [name] remains on ASA81, Aten50, Plendil10, Hyzaar100-12.5 and BP is well controlled at 110/66 today; Carlos [name] feels well w/o CP, palpit, dizzy, SOB, edema, etc. ~  5/15: on Aten50, Plendil10, Hyzaar100-12.5;  BP= 118/64 & denies CP, palpit, dizzy, SOB, edema, etc ~  11/15: on Aten50-1/2, Felodip10, Hyzaar100-12.5; BP= 102/62 today & we decided to decr the Hyzaar to 1/2 tab daily; Carlos [name] will monitor BP at home ~  5/16:  on Aten50-1/2, Plendil10, Hyzaar100/12.5-taking 1/2;  BP= 104/60 & Carlos [name] remains asymptomatic... ~  11/16: on same meds w/ good control of BP.  VALVULAR HEART DISEASE (ICD-424.90) - followed by [name]/Taylor- pt knows to take AMOX prior to dental work...  ~  2DEcho in 2002 w/ mild AI, MR, TR and normal  LVF... ~  2DEcho 2/09 showed LV sl dilated w/ mild DD, norm LVF w/o regional wall motion abn & EF= 55-60%;  mild AI, mild-mod MR, dil LA @ 11m... ~  NuclearStressTest 3/10 showed mild inferobas thinning, no ischemia, EF=48% w/ mild global HK... ~  2DEcho 4/10 showed mild AI & MR, mod dil LA, norm LVF w/ EF= 55-60% (Myoview= false result). ~  2DEcho 6/11 showed mod conc LVH, norm wall motion & EF= 50-55%, mild AI, mild thick MV leaflets w/ mild MR, mod LAdil= 47m ~  10/12 & 11/13: [name]> doing satis, not on anticoagulation, no changes made... ~   EKG 5/15 showed SBrady, rate46, RBBB, NAD...  ~  11/15: Carlos [name] had f/u [name]- doing satis, asymptomatic but BP was low at 108/60 so they decr the aten50 to 1/2 tab daily... ~  11/16: Carlos [name] had 2DEcho 9/16 at [facility]inic- norm LV size & function w/ EF=60-65%, no regional wall motion abn, AoV mildly thickened & calcif w/ mod AI, Asc Ao is mildly dilated, mild post leaflet of MV prolapse w/ mild MR, mod LA dil at 4777m& mod pulmonic regurg.  AFIB >> ATRIAL FLUTTER (ICD-427.32) & BUNDLE BRANCH BLOCK, RIGHT (ICD-426.4) - on ASA '325mg'$ /d and rate control strategy...  ~  prev EKG's w/ IRBBB...2/09 w/ IVCD, NSR, & QS wave in III and aVF (new)... check LVF (nl) and wall motion (nl) on echo... ~  Carlos [name] developed AFlutter 2011 & had evals from [name] 7/11 & [name] 10/11-  they rec rate control & ASA, w/ consideration of cath ablation if not controlled... ~  2DEcho 6/11 showed mod conc LVH, norm wall motion & EF= 50-55%, mild AI, mild thick MV leaflets w/ mild MR, mod LAdil= 59m59m ~  Holter 6/11 revealed AFlutter (ave rate 106, max=145, no pauses)... ~  5/13: Carlos [name] saw [name]- doing well, no symptoms, no indication for pacer...  ~  EKG 11/13 showed sinus brady, rate48, rsr', otherw wnl... ~   EKG 5/15 showed SBrady, rate46, RBBB, NAD...  Marland Kitchen~  8/16: Carlos [name] presented w/ increased HR (noted pulse up to 80's & irreg)=> EKG w/ AFib & controlled VR where prev  Carlos [name]'d been in SinusBrady w/ pulse~50; denied CP/ palpit/ SOB/ edema; started on Eliquis & referred to Cards for f/u & 2DEcho. ~  9/16: seen in AFclinic & had spont converted to NSR- ASA changed to Eliquis5Bid & continued on other meds the same...  HYPERCHOLESTEROLEMIA (ICD-272.0) - on SIMVASTATIN '40mg'$ /d, Fish Oil, & diet...  ~  FLP Accident8 showed TChol 106, TG 59, HDL 48, LDL46 ~  FLP 2/09 showed TChol 123, TG 57, HDL 51, LDL 61 ~  FLP 2/10 showed TChol 117, TG 47, HDL 55, LDL 53 ~  FLP 4/11 showed TChol 105, TG 27, HDL 57, LDL 43 ~  FLP 4/12 on Simva40 showed TChol 104, Tg 34, HDL 51, LDL 46 ~  FLP 5/13 on Simva40 showed TChol 112, TG 39, HDL 55, LDL 49 ~  FLP 5/14 on Simva40 showed TChol 113, TG 42, HDL 52, LDL 53 ~  FLP 5/15 on Simva40 showed  TChol 97, TG 20, HDL 51,  LDL 42 ~  FLP 5/16 on Simva40 showed  TChol 116, TG 48, HDL 65, LDL 41  Hx of HEMORRHOIDS (ICD-455.6) - last colonoscopy in [place] 3/05 by DrNichols was neg x hems... rec f/u 5 yrs due to fam history & Carlos [name] asks to be seen here (refer to GI)... ~  12/10: had f/u colonoscopy by DrStark- 34m sessile polyp in cecum= path showed fragments of tubular adenoma; +mild divertics...   RENAL CALCULUS, HX OF (ICD-V13.01) - surg in 1974 for kid stone...  ~  7/14:  CT Abd & Pelvis showed enhancement of the left kidney w/ marked perineph stranding, mod left hydroureteronephrosis, 428mleft UVJ stone, tortuous left ureter, enlarged prostatelumbar DJD...  DEGENERATIVE JOINT DISEASE (ICD-715.90) - occas neck discomfort without arm pain, numbness, etc... Carlos [name] is very active...  ANXIETY (ICD-300.00)  BASAL CELL CARCINOMA, HX OF (ICD-V10.83) - Carlos [name]~  8/12: note from DrWHoover- full body check & several AKs removed... ~  Carlos [name] continues to f/u w/ Derm every 82mo64mosays...  Health Maintenance: ~  GI:  DrStark w/ colon 12/10 as above... ~  GU:  4/11> DRE= neg & PSA= 1.50 ~  Immunizations:  Carlos [name] gets yearly Flu  shots,  ?Pneumovax,  Given TDAP 5/13...   Past Surgical History  Procedure Laterality Date  . Kidney stone surgery    . Colonoscopy    . Polypectomy      Outpatient Encounter Prescriptions as of 07/20/2015  Medication Sig  . fish oil-omega-3 fatty acids 1000 MG capsule Take 1 g by mouth daily.    . Multiple Vitamin (MULTIVITAMIN) tablet Take 1 tablet by mouth daily.    . [DISCONTINUED] apixaban (ELIQUIS) 5 MG TABS tablet Take 1 tablet (5 mg total) by mouth 2 (two) times daily.  . [DISCONTINUED] atenolol (TENORMIN) 50 MG tablet TAKE 1 TABLET BY MOUTH DAILY  . [DISCONTINUED] felodipine (PLENDIL) 10 MG 24 hr tablet TAKE 1 TABLET BY MOUTH EVERY DAY  . [DISCONTINUED] losartan-hydrochlorothiazide (HYZAAR) 100-12.5 MG per tablet TAKE 1 TABLET BY MOUTH EVERY DAY  . [DISCONTINUED] simvastatin (ZOCOR) 40 MG tablet TAKE 1 TABLET BY MOUTH EVERY NIGHT AT BEDTIME  . amoxicillin (AMOXIL) 500 MG capsule 4 tabs by mouth 1 hr prior to dental procedures.   No facility-administered encounter medications on file as of 07/20/2015.    No Known Allergies   Current Medications, Allergies, Past Medical History, Past Surgical History, Family History, and Social History were reviewed in [employer].    Review of Systems         See HPI - all other systems neg except as noted... The patient denies anorexia, fever, weight loss, weight gain, vision loss, decreased hearing, hoarseness, chest pain, syncope, dyspnea on exertion, peripheral edema, prolonged cough, headaches, hemoptysis, abdominal pain, melena, hematochezia, severe indigestion/heartburn, hematuria, incontinence, muscle weakness, suspicious skin lesions, transient blindness, difficulty walking, depression, unusual weight change, abnormal bleeding, enlarged lymph nodes, and angioedema.    Objective:   Physical Exam    WD, WN, 74 y10 WM in NAD... GENERAL:  Alert & oriented; pleasant & cooperative... HEENT:  [place]/AT,  EOM-wnl, PERRLA, Fundi-benign, EACs-clear, TMs-wnl, NOSE-clear, THROAT-clear & wnl. NECK:  Supple w/ fairROM; no JVD; normal carotid impulses w/o bruits; no thyromegaly or nodules palpated; no lymphadenopathy. CHEST:  Clear to P & A; without wheezes/ rales/ or rhonchi. HEART:  regular rhythm;  gr1/6 SEM LSB & gr1/KZ6/0stolic murmur in aortic area, without rubs or  gallops heard... ABDOMEN:  Soft & nontender; normal bowel sounds; no organomegaly or masses detected. (RECTAL:  Neg - prostate 2+ & nontender w/o nodules; stool hematest neg) EXT: without deformities or arthritic changes; no varicose veins/ venous insuffic/ or edema. NEURO:  CN's intact; motor testing normal; sensory testing normal; gait normal & balance OK. DERM:  No lesions noted; no rash etc...  RADIOLOGY DATA:  Reviewed in the EPIC EMR & discussed w/ the patient...  LABORATORY DATA:  Reviewed in the EPIC EMR & discussed w/ the patient...    Assessment & Plan:    NEW ONSET AFIB 04/2015>> odd that Carlos [name] became concerned when his pulse rate increased to normal range! EKG shows AFib w/ controlled VR; Carlos [name] is essentially asymptomatic- we discussed w/ Cards-[name] & started pt on Eliquis anticoag for Chads-Vasc of 2-3; Carlos [name] converted spont back to [place] rhythm...   HBP>  BP remains on the low side despite decr Aten50&Hyzaar to 1/2 tab/d; REC to continue same for now & watch BP...  Hx AFib/Flutter, RBBB, Valv heart dis>  Followed by [name] & AFclinic  CHOL>  Stable on diet + Simva40; continue same...  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  Other medical issues as noted...  No tremor noted & we will continue to observe...    Patient's Medications  New Prescriptions   No medications on file  Previous Medications   AMOXICILLIN (AMOXIL) 500 MG CAPSULE    4 tabs by mouth 1 hr prior to dental procedures.   ATENOLOL (TENORMIN) 50 MG TABLET    Take 25 mg by mouth daily.   FISH  OIL-OMEGA-3 FATTY ACIDS 1000 MG CAPSULE    Take 1 g by mouth daily.     MULTIPLE VITAMIN (MULTIVITAMIN) TABLET    Take 1 tablet by mouth daily.    Modified Medications   Modified Medication Previous Medication   ATENOLOL (TENORMIN) 50 MG TABLET atenolol (TENORMIN) 50 MG tablet      TAKE 1 TABLET BY MOUTH DAILY    TAKE 1 TABLET BY MOUTH DAILY   ELIQUIS 5 MG TABS TABLET apixaban (ELIQUIS) 5 MG TABS tablet      TAKE 1 TABLET BY MOUTH TWICE DAILY    Take 1 tablet (5 mg total) by mouth 2 (two) times daily.   FELODIPINE (PLENDIL) 10 MG 24 HR TABLET felodipine (PLENDIL) 10 MG 24 hr tablet      TAKE 1 TABLET BY MOUTH EVERY DAY    TAKE 1 TABLET BY MOUTH EVERY DAY   LOSARTAN-HYDROCHLOROTHIAZIDE (HYZAAR) 100-12.5 MG TABLET losartan-hydrochlorothiazide (HYZAAR) 100-12.5 MG tablet      TAKE 1 TABLET BY MOUTH EVERY DAY    TAKE 1 TABLET BY MOUTH EVERY DAY   SIMVASTATIN (ZOCOR) 40 MG TABLET simvastatin (ZOCOR) 40 MG tablet      TAKE 1 TABLET BY MOUTH EVERY NIGHT AT BEDTIME    TAKE 1 TABLET BY MOUTH EVERY NIGHT AT BEDTIME  Discontinued Medications   LOSARTAN-HYDROCHLOROTHIAZIDE (HYZAAR) 100-12.5 MG PER TABLET    TAKE 1 TABLET BY MOUTH EVERY DAY

## 2016-02-20 ENCOUNTER — Other Ambulatory Visit: Payer: Self-pay | Admitting: Pulmonary Disease

## 2016-02-23 ENCOUNTER — Telehealth: Payer: Self-pay | Admitting: Pulmonary Disease

## 2016-02-23 MED ORDER — ATENOLOL 50 MG PO TABS: 50.0000 mg | ORAL_TABLET | Freq: Every day | ORAL | Status: DC

## 2016-02-23 NOTE — Telephone Encounter (Signed)
Spoke with pt, requesting refill on atenolol.  This has been sent to preferred pharmacy.  Nothing further needed.

## 2016-03-09 ENCOUNTER — Encounter: Payer: Self-pay | Admitting: Neurology

## 2016-03-09 ENCOUNTER — Ambulatory Visit (INDEPENDENT_AMBULATORY_CARE_PROVIDER_SITE_OTHER): Payer: Medicare Other | Admitting: Neurology

## 2016-03-09 VITALS — BP 102/78 | HR 52 | Ht 72.0 in | Wt 173.0 lb

## 2016-03-09 DIAGNOSIS — G20 Parkinson's disease: Secondary | ICD-10-CM | POA: Diagnosis not present

## 2016-03-09 NOTE — Progress Notes (Signed)
Carlos [name] was seen today in the movement disorders clinic for neurologic consultation at the request of NADEL,SCOTT M, MD.  This patient is accompanied in the office by his spouse who supplements the history. The consultation is for the evaluation of tremor.  Pt states that he noted stiffness in the arm about a year ago and then 6 months ago he noted tremor in the right arm (tremor noted in med records in 07/2015).  Wife notes tremor in the R arm when driving or when stressed in traffic.  She will note in when the hand is hanging loose or if hanging across her in bed.   Specific Symptoms:  Tremor: Yes.  , only R hand and only at rest (drinks very little caffeine) Family hx of similar:  No. Voice: some change (softer) but wife thinks that it was age and wife states that it comes and goes Sleep: sleeps well  Vivid Dreams:  No. (used to)  Acting out dreams:  No. Wet Pillows: No. Postural symptoms:  No.  Falls?  No. Bradykinesia symptoms: no bradykinesia noted Loss of smell:  Yes.   Loss of taste:  No. Urinary Incontinence:  No. Difficulty Swallowing:  No. Handwriting, micrographia: Yes.   Trouble with ADL's:  No.  Trouble buttoning clothing: No. Depression:  No., but wife states that he is quieter than in the past Memory changes:  Yes.   (trouble with names) Hallucinations:  No.  visual distortions: No. N/V:  No. Lightheaded:  No.  Syncope: No. Diplopia:  No. Dyskinesia:  No.  Neuroimaging has not previously been performed.     ALLERGIES:  No Known Allergies  CURRENT MEDICATIONS:  Outpatient Encounter Prescriptions as of 03/09/2016  Medication Sig  . amoxicillin (AMOXIL) 500 MG capsule 4 tabs by mouth 1 hr prior to dental procedures.  Marland Kitchen atenolol (TENORMIN) 50 MG tablet Take 25 mg by mouth daily.  Marland Kitchen ELIQUIS 5 MG TABS tablet TAKE 1 TABLET BY MOUTH TWICE DAILY  . felodipine (PLENDIL) 10 MG 24 hr tablet TAKE 1 TABLET BY MOUTH EVERY DAY  . fish oil-omega-3 fatty acids 1000  MG capsule Take 1 g by mouth daily.    Marland Kitchen losartan-hydrochlorothiazide (HYZAAR) 100-12.5 MG tablet TAKE 1 TABLET BY MOUTH EVERY DAY  . Multiple Vitamin (MULTIVITAMIN) tablet Take 1 tablet by mouth daily.    . simvastatin (ZOCOR) 40 MG tablet TAKE 1 TABLET BY MOUTH EVERY NIGHT AT BEDTIME  . [DISCONTINUED] atenolol (TENORMIN) 50 MG tablet Take 1 tablet (50 mg total) by mouth daily.   No facility-administered encounter medications on file as of 03/09/2016.    PAST MEDICAL HISTORY:   Past Medical History  Diagnosis Date  . Allergic rhinitis   . HTN (hypertension)   . Aortic insufficiency     mild... echo... 02/2010  . Mitral regurgitation     mild... echo.. 02/2010  . LVH (left ventricular hypertrophy)     moderate... echo... 02/2010  . Diastolic dysfunction     EF 50-55%... echo.. 02/2010 (55-60% echo 2009)  . Incomplete RBBB     IRBBB; sinus bradycardia w/ PVCs  . Sinus bradycardia   . PVC (premature ventricular contraction)   . Atrial flutter ([place]) 02/03/2010    Consult by Dr. Lovena Le... June, 2011.... plan rate control...  Coumadin not needed... ablation if rate not controlled well  . Dyslipidemia   . Hemorrhoids   . Renal calculus   . Degenerative joint disease   . Anxiety   .  Basal cell carcinoma   . Ejection fraction     EF 50-55%, echo, June, 2011  . Chest pain     Nuclear, March, 2010, no ischemia  . Allergy     PAST SURGICAL HISTORY:   Past Surgical History  Procedure Laterality Date  . Kidney stone surgery    . Colonoscopy    . Polypectomy      SOCIAL HISTORY:   Social History   Social History  . Marital Status: Married    Spouse Name: Cordarryl Ohm  . Number of Children: 2  . Years of Education: N/A   Occupational History  . retired     Risk analyst   Social History Main Topics  . Smoking status: Never Smoker   . Smokeless tobacco: Never Used  . Alcohol Use: 1.8 oz/week    3 Glasses of wine per week     Comment: 1-2 glasses of wine per week  . Drug  Use: No  . Sexual Activity: Not on file   Other Topics Concern  . Not on file   Social History Narrative    FAMILY HISTORY:   Family Status  Relation Status Death Age  . Mother Deceased     stroke  . Father Deceased 89    "old age"  . Sister Alive     healthy  . Sister Alive     healthy  . Child Alive     2, healthy    ROS:  A complete 10 system review of systems was obtained and was unremarkable apart from what is mentioned above.  PHYSICAL EXAMINATION:    VITALS:   Filed Vitals:   03/09/16 0908  BP: 102/78  Pulse: 52  Height: 6' (1.829 m)  Weight: 173 lb (78.472 kg)    GEN:  The patient appears stated age and is in NAD. HEENT:  Normocephalic, atraumatic.  The mucous membranes are moist. The superficial temporal arteries are without ropiness or tenderness. CV:  RRR Lungs:  CTAB Neck/HEME:  There are no carotid bruits bilaterally.  Neurological examination:  Orientation: The patient is alert and oriented x3. Fund of knowledge is appropriate.  Recent and remote memory are intact.  Attention and concentration are normal.    Able to name objects and repeat phrases. Cranial nerves: There is good facial symmetry. Pupils are equal round and reactive to light bilaterally. Fundoscopic exam reveals clear margins bilaterally. Extraocular muscles are intact. The visual fields are full to confrontational testing. The speech is fluent and clear. Soft palate rises symmetrically and there is no tongue deviation. Hearing is intact to conversational tone. Sensation: Sensation is intact to light and pinprick throughout (facial, trunk, extremities). Vibration is decreased at the bilateral big toe. There is no extinction with double simultaneous stimulation. There is no sensory dermatomal level identified. Motor: Strength is 5/5 in the bilateral upper and lower extremities.   Shoulder shrug is equal and symmetric.  There is no pronator drift. Deep tendon reflexes: Deep tendon reflexes  are 2/4 at the bilateral biceps, triceps, brachioradialis,2+ at the bilateral patella and 1/4 at the bilateral achilles. Plantar responses are downgoing bilaterally.  Movement examination: Tone: There is increased tone in the RUE with activation procedures only.  Tone elsewhere was normal.  Abnormal movements: There is RUE resting tremor that increases with ambulation Coordination:  There is decremation with RAM's, only with finger taps and hand opening and closing on the right Gait and Station: The patient has no difficulty arising out of  a deep-seated chair without the use of the hands. The patient's stride length is normal with decreased arm swing on the right.  The patient has a negative pull test.      LABS    Chemistry      Component Value Date/Time   NA 141 02/06/2016 1223   K 3.7 02/06/2016 1223   CL 101 02/06/2016 1223   CO2 32 02/06/2016 1223   BUN 16 02/06/2016 1223   CREATININE 0.85 02/06/2016 1223      Component Value Date/Time   CALCIUM 10.0 02/06/2016 1223   ALKPHOS 67 02/06/2016 1223   AST 22 02/06/2016 1223   ALT 23 02/06/2016 1223   BILITOT 1.3* 02/06/2016 1223     Lab Results  Component Value Date   WBC 7.4 02/06/2016   HGB 16.7 02/06/2016   HCT 49.7 02/06/2016   MCV 89.0 02/06/2016   PLT 164.0 02/06/2016   Lab Results  Component Value Date   TSH 1.12 02/06/2016   No results found for: VITAMINB12   ASSESSMENT/PLAN:  1.  Parkinsonism.  I suspect that this does represent idiopathic Parkinson's disease.  He has Hoehn and Yoehr stage 1 disease.  -We discussed the diagnosis as well as pathophysiology of the disease.  We discussed treatment options as well as prognostic indicators.  Patient education was provided.  -Greater than 50% of the 80 minute visit was spent in counseling answering questions and talking about what to expect now as well as in the future.  We talked about medication options as well as potential future surgical options.  We talked about  safety in the home.  -We decided to hold on adding medication for today but did discuss med in detail  -discussed in detail importance of safe, CV exercise  -We discussed community resources in the area including patient support groups and community exercise programs for PD and pt education was provided to the patient from [place].   Information given on the [place] support group.    2.  HTN  -on lopressor.  May be on this for aflutter/afib.  Will need to closely watch his BP as it is already very low but he is asymptommatic.    3.  Follow up is anticipated in the next few months, sooner should new neurologic issues arise.

## 2016-03-15 ENCOUNTER — Ambulatory Visit: Payer: Medicare Other | Admitting: Internal Medicine

## 2016-03-19 ENCOUNTER — Telehealth: Payer: Self-pay | Admitting: Pulmonary Disease

## 2016-03-19 NOTE — Telephone Encounter (Signed)
Called spoke with patient. He reports he saw Dr. Hall Busing and was told her had early stage of parkinson's. They are wanting him to do a "stationary bike program" for 3-4 times a week. He wants to make sure Dr. Lenna Gilford feels like he is okay to do this. He reports this is suppose to help slow down the parkinson's progression w/ getting his HR elevated for about 78min-1hr 3-4 times a week. Please advise SN thanks

## 2016-03-19 NOTE — Telephone Encounter (Signed)
Per SN--  Ok to do the exercise program.

## 2016-03-19 NOTE — Telephone Encounter (Signed)
Called spoke with pt and made aware. Nothing further needed 

## 2016-03-20 ENCOUNTER — Telehealth: Payer: Self-pay | Admitting: Neurology

## 2016-03-20 NOTE — Telephone Encounter (Signed)
Carlos [name] [birth year] # [id number] [name] his wife called needing to know the name of the exercise for the stationary bike that Dr. Carles Collet had mentioned to him. Thank you

## 2016-03-20 NOTE — Telephone Encounter (Signed)
Spoke with patient's wife about exercise classes.

## 2016-03-22 ENCOUNTER — Telehealth: Payer: Self-pay | Admitting: Neurology

## 2016-03-22 NOTE — Telephone Encounter (Signed)
[name] made aware. He has appt with Dr. Lovena Le next Friday.

## 2016-03-22 NOTE — Telephone Encounter (Signed)
Got form from pt re: participating in PD cycling program at [facility] and that form requires that pts are free of cardiac disease.  Given pts hx of sinus node dysfunction, he will need Dr. Tanna Furry input on this exercise program, which can be pretty vigorous.  I put the form in my outbox

## 2016-03-30 ENCOUNTER — Ambulatory Visit (INDEPENDENT_AMBULATORY_CARE_PROVIDER_SITE_OTHER): Payer: Medicare Other | Admitting: Internal Medicine

## 2016-03-30 ENCOUNTER — Encounter: Payer: Self-pay | Admitting: Internal Medicine

## 2016-03-30 VITALS — BP 108/84 | HR 77 | Ht 72.0 in | Wt 174.8 lb

## 2016-03-30 DIAGNOSIS — I480 Paroxysmal atrial fibrillation: Secondary | ICD-10-CM | POA: Diagnosis not present

## 2016-03-30 DIAGNOSIS — E785 Hyperlipidemia, unspecified: Secondary | ICD-10-CM

## 2016-03-30 NOTE — Progress Notes (Signed)
HPI [name] returns today for followup. He is a very pleasant 75 year old man with palpitations and documented paroxysmal atrial flutter as well as brief episodes of bradycardia. He has been nearly asymptomatic. He denies chest pain, shortness of breath, and syncope. He has retired from [employer]. He has been diagnosed with early Parkinson's disease. He has minimal tremor. He has minimal palpitations and is about to start neuro rehab. .No Known Allergies   Current Outpatient Prescriptions  Medication Sig Dispense Refill  . amoxicillin (AMOXIL) 500 MG capsule 4 tabs by mouth 1 hr prior to dental procedures.    Marland Kitchen atenolol (TENORMIN) 50 MG tablet Take 25 mg by mouth daily.    Marland Kitchen ELIQUIS 5 MG TABS tablet TAKE 1 TABLET BY MOUTH TWICE DAILY 60 tablet 5  . felodipine (PLENDIL) 10 MG 24 hr tablet TAKE 1 TABLET BY MOUTH EVERY DAY 90 tablet 1  . fish oil-omega-3 fatty acids 1000 MG capsule Take 1 g by mouth daily.      Marland Kitchen losartan-hydrochlorothiazide (HYZAAR) 100-12.5 MG tablet TAKE 1 TABLET BY MOUTH EVERY DAY 90 tablet 1  . Multiple Vitamin (MULTIVITAMIN) tablet Take 1 tablet by mouth daily.      . simvastatin (ZOCOR) 40 MG tablet TAKE 1 TABLET BY MOUTH EVERY NIGHT AT BEDTIME 90 tablet 1   No current facility-administered medications for this visit.      Past Medical History:  Diagnosis Date  . Allergic rhinitis   . Allergy   . Anxiety   . Aortic insufficiency    mild... echo... 02/2010  . Atrial flutter ([place]) 02/03/2010   Consult by Dr. Lovena Le... June, 2011.... plan rate control...  Coumadin not needed... ablation if rate not controlled well  . Basal cell carcinoma   . Chest pain    Nuclear, March, 2010, no ischemia  . Degenerative joint disease   . Diastolic dysfunction    EF 50-55%... echo.. 02/2010 (55-60% echo 2009)  . Dyslipidemia   . Ejection fraction    EF 50-55%, echo, June, 2011  . Hemorrhoids   . HTN (hypertension)   . Incomplete RBBB    IRBBB; sinus bradycardia  w/ PVCs  . LVH (left ventricular hypertrophy)    moderate... echo... 02/2010  . Mitral regurgitation    mild... echo.. 02/2010  . PVC (premature ventricular contraction)   . Renal calculus   . Sinus bradycardia     ROS:   All systems reviewed and negative except as noted in the HPI.   Past Surgical History:  Procedure Laterality Date  . COLONOSCOPY    . KIDNEY STONE SURGERY    . POLYPECTOMY       Family History  Problem Relation Age of Onset  . Hypertension Mother   . Colon cancer Paternal Aunt   . Colon cancer Paternal Uncle   . Stroke Mother 86     Social History   Social History  . Marital status: Married    Spouse name: Carlos [name]  . Number of children: 2  . Years of education: N/A   Occupational History  . retired     Risk analyst   Social History Main Topics  . Smoking status: Never Smoker  . Smokeless tobacco: Never Used  . Alcohol use 1.8 oz/week    3 Glasses of wine per week     Comment: 1-2 glasses of wine per week  . Drug use: No  . Sexual activity: Not on file   Other Topics Concern  .  Not on file   Social History Narrative  . No narrative on file     BP 108/84   Pulse 77   Ht 6' (1.829 m)   Wt 174 lb 12.8 oz (79.3 kg)   BMI 23.71 kg/m   Physical Exam:  Well appearing 75 yo man, NAD HEENT: Unremarkable Neck:  6 cm JVD, no thyromegally Lymphatics:  No adenopathy Back:  No CVA tenderness Lungs:  Clear with no wheezes.  HEART:  IRegular rate and rhythm, no murmurs, no rubs, no clicks Abd:  soft, positive bowel sounds, no organomegally, no rebound, no guarding Ext:  2 plus pulses, no edema, no cyanosis, no clubbing Skin:  No rashes no nodules Neuro:  CN II through XII intact, motor grossly intact  ECG - atrial fib with a controlled VR   Assess/Plan:  1. Persistent atrial fib/flutter - he is in atrial fib today and his rate appears to be well controlled. He will continue his current meds. 2. HTN - his blood pressure is  well controlled. We discussed the possibility that he might eventually not need as much blood pressure lowering 3. Dyslipidemia - he will continue his statin therapy 4. Parkinson's disease - he has very mild symptoms with minimal resting tremor. We discussed exercise. I have recommended he participate in any exercise program available to him. He should exercise such that he can talk while exercising.  Carlos [name].D.

## 2016-03-30 NOTE — Patient Instructions (Signed)

## 2016-04-06 ENCOUNTER — Telehealth: Payer: Self-pay | Admitting: Neurology

## 2016-04-06 NOTE — Telephone Encounter (Signed)
Left message on machine for patient making them aware Dr. Lovena Le will need to sign release form for [employer] exercise program. They are to call back with any questions or if they need another [employer] form.

## 2016-04-06 NOTE — Telephone Encounter (Signed)
Carlos [name] 12/07/1940. His wife was calling regarding the Form that was left for Dr. Carles Collet to fill out regarding [employer] cycling program. She also said he saw the other Doctor that Dr. Carles Collet had referred him to. She just wanted to know where to go from here. Her # is cell [phone] and home is [phone]. Thank you

## 2016-04-10 ENCOUNTER — Telehealth: Payer: Self-pay | Admitting: Internal Medicine

## 2016-04-10 NOTE — Telephone Encounter (Signed)
Walk in pt form-Sealed Envelope Dropped off-Will give to Shannon when she comes back.

## 2016-04-16 ENCOUNTER — Telehealth: Payer: Self-pay | Admitting: Internal Medicine

## 2016-04-16 NOTE — Telephone Encounter (Signed)
I have envelope patient Dropped off-this is addressto to [employer] and I do not open sealed envelopes . Claiborne Billings is back in [employer] tomorrow I will give to her then. I have called patient LVM for him to call me back so I can make him aware I have the envelope.

## 2016-04-16 NOTE — Telephone Encounter (Signed)
Follow Up:   Wife left forms last Tuesday,would like to know what is the staus of the forms? Please let her know,so she can find out what else she needs to do.

## 2016-04-18 ENCOUNTER — Telehealth: Payer: Self-pay | Admitting: Internal Medicine

## 2016-04-18 NOTE — Telephone Encounter (Signed)
Patient aware Registration Form for [facility] ready for pick up.

## 2016-05-15 ENCOUNTER — Telehealth: Payer: Self-pay | Admitting: Internal Medicine

## 2016-05-15 NOTE — Telephone Encounter (Signed)
Physician Medical Release Form for [place] ready for pick up-could not leave message on home machine.

## 2016-05-22 ENCOUNTER — Other Ambulatory Visit: Payer: Self-pay | Admitting: Pulmonary Disease

## 2016-05-25 ENCOUNTER — Other Ambulatory Visit: Payer: Self-pay | Admitting: Pulmonary Disease

## 2016-07-30 ENCOUNTER — Other Ambulatory Visit: Payer: Self-pay | Admitting: Pulmonary Disease

## 2016-08-07 ENCOUNTER — Ambulatory Visit (INDEPENDENT_AMBULATORY_CARE_PROVIDER_SITE_OTHER): Payer: Medicare Other | Admitting: Pulmonary Disease

## 2016-08-07 VITALS — BP 114/70 | HR 84 | Temp 96.8°F | Ht 72.0 in | Wt 178.0 lb

## 2016-08-07 DIAGNOSIS — I480 Paroxysmal atrial fibrillation: Secondary | ICD-10-CM

## 2016-08-07 DIAGNOSIS — I340 Nonrheumatic mitral (valve) insufficiency: Secondary | ICD-10-CM

## 2016-08-07 DIAGNOSIS — G20 Parkinson's disease: Secondary | ICD-10-CM

## 2016-08-07 DIAGNOSIS — E785 Hyperlipidemia, unspecified: Secondary | ICD-10-CM

## 2016-08-07 DIAGNOSIS — I38 Endocarditis, valve unspecified: Secondary | ICD-10-CM

## 2016-08-07 DIAGNOSIS — I351 Nonrheumatic aortic (valve) insufficiency: Secondary | ICD-10-CM

## 2016-08-07 DIAGNOSIS — M150 Primary generalized (osteo)arthritis: Secondary | ICD-10-CM

## 2016-08-07 DIAGNOSIS — I10 Essential (primary) hypertension: Secondary | ICD-10-CM

## 2016-08-07 DIAGNOSIS — M159 Polyosteoarthritis, unspecified: Secondary | ICD-10-CM

## 2016-08-07 NOTE — Patient Instructions (Signed)
Today we updated your med list in our EPIC system...    Continue your current medications the same...  Keep up the good work w/ your exercise & BOXING therapy...  Call for any questions...  Let's plan a follow up visit in 23mo, sooner if needed for problems.Marland KitchenMarland Kitchen

## 2016-08-08 ENCOUNTER — Encounter: Payer: Self-pay | Admitting: Pulmonary Disease

## 2016-08-08 NOTE — Progress Notes (Signed)
Subjective:    Patient ID: Carlos [name], male    DOB: 12/11/1940, 75 y.o.   MRN: [id number]  HPI 75 y/o WM here for a follow up visit... he has multiple medical problems as noted below...   ~  SEE PREV EPIC NOTES FOR OLDER DATA >>    CXR 5/14 showed heart at upper lim of norm, mild atherosclerotic changes in Ao, bilat nipple shadows, NAD...  LABS 5/14:  FLP- at goals on simva40;  Chems- wnl;  CBC- wnl x plat=130K;  TSH=1.04;  PSA=1.70...  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    HBP> on Aten50, Plendil10, Hyzaar100-12.5;  BP= 118/64 & denies CP, palpit, dizzy, SOB, edema, etc...    Valv Heart Dis> on Amoxicillin Prn for SBE prophy; he had f/u 5/15 Carlos [name]- PAFlutter, mild AI&MR, doing satis/ no change-same meds...    AFlutter, RBBB> on ASA81; denies CP, palpit, dizzy, etc; he had f/u Carlos [name] 5/13- doing well & no need for pacer...    Chol> on Simva40, FishOil; on diet & wt stable- FLP 5/15 showed TChol 97, TG 20, HDL 51, LDL 42    GI- polyps, hems> he remains asymptomatic w/o abd pain, n/v, c/d, blood seen; last colon 12/10 w/ divertics, 373madenoma, & f/u 5y83yr.    Kidney stones> he remains asymptomatic w/o pain, dysuria, blood, etc...    DJD> on OTC analgesics as needed; he continues to work hard, min arthritic symptoms, using OTC meds as needed...    Anxiety> doing satis, not on meds...    Skin cancer> he continues to f/u w/ Derm Q3mo95moWe reviewed prob list, meds, xrays and labs> see below for updates >>   EKG 5/15 showed SBrady, rate46, RBBB, NAD...  LABS 5/15:  FLP- at goals on Simva40;  Chems- wnl;  CBC- wnl;  TSH=0.85;  PSA=1.88  ~  July 20, 2014:  70mo 54mo& JamesCaineinues to do well- no new  complaints or concerns;  He saw Carlos [name] several weeks ago- Hx SBrady & Aflutter, RBBB; doing well w/o symptoms but BP on the low end so they cut back on his Atenolol50 to 1/2 tab daily...     BP remains on low side w/ Aten50-1/2, Felodip10, Hyzaar100-12.5; BP= 102/62 today & we decided to decr the Hyzaar to 1/2 tab daily; he will monitor BP at home...    He remains on Simva40 + FishOil and last FLP 5/15 looked great, continue same...     ROS is otherw neg- he continues f/u w/ Derm every 3-70mo..52mo reviewed prob list, meds, xrays and labs> see below for updates >> He had the 2015 Flu vaccine already; given PREVNAR-13 today...   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    HBP>  on Aten50-1/2, Plendil10, Hyzaar100/12.5-taking 1/2;  BP= 104/60 & denies CP, palpit, dizzy, SOB, edema, etc...    Valv Heart Dis> on Amoxicillin Prn for SBE prophy; he had f/u 5/15 Carlos [name]- PAFlutter, mild AI&MR, doing satis/ no change-same meds...    AFlutter, RBBB> on ASA81; denies CP, palpit, dizzy, etc; he had f/u Carlos [name] 5/15- doing well & no need for pacer...    Chol> on Simva40, FishOil; on diet & wt stable- FLP 5/16 showed TChol 116, TG 48, HDL 65, LDL 41; continue same...    GI- polyps, hems> he remains asymptomatic w/o abd pain, n/v, c/d, blood seen; last colon 12/10 w/ divertics, 33m adenoma, & f/u 540yr..    Kidney stones> he had stone in the 1970's that required surg; last stone was 7/14 & he passed it; he remains asymptomatic w/o pain, dysuria, blood, etc;    DJD> on OTC analgesics as needed; he continues to work hard, min arthritic symptoms,  using OTC meds as needed...    Anxiety> doing satis, not on meds...    Skin cancer> he continues to f/u w/ Derm Q3m40mo We reviewed prob list, meds, xrays and labs> see below for updates >>   CXR 5/16 showed norm heart size, clear lungs, NAD...  LABS 5/16:  FLP- at goals on Simva40;  Chems- wnl x BS=121 (diet rx);  CBC- wnl;  TSH=1.16;  PSA=2.10... PLAN>> stable overall, continue same meds & watch BP at home (may need to decr the Plendil if BP on low end)...  ~  May 03, 2015:  Add-on appt requested by pt for "BP & heart rate"> he states that he usually checks BP/HR every day & it has maintained in a narrow range w/ BP=100-110 sys and HR=40s-50s on his meds; [place]; [place] BP was elevated in the 120-130 range and pulse up to 85/min which really concerned him; he denies CP, palpit, dizzy, SOB/DOE, edema, etc; CV hx follows>     HBP> on Aten50-1/2, Plendil10, Hyzaar100/12.5-taking 1/2;  BP= 116/70 & pulse=82/min but irreg; denies CP, palpit, dizzy, SOB, edema, etc...    Valv Heart Dis> on Amoxicillin Prn for SBE prophy; he last saw Carlos [name] 5/15> HxPAFlutter, mild AI&MR (last Echo 2011), doing satis/ no change- same meds...    HxAFlutter, RBBB> on ASA81; denies CP, palpit, dizzy, etc; he had f/u [name]11/15- doing well, pulse was in 40s & Aten50 decr to 45m45m.. EXAM reveals Afeb, VSS but pulse irreg, O2sat=98% on RA;  HEENT- neg, clears throat a lot;  Chest- clear w/o w/r/r;  Heart- irreg irreg gr1/MB5/5 & diastolic murmur at base;  Abd- soft, neg;  Ext- neg w/o c/c/e...  EKG shows AFib w/ controlled HR~80, one PVC noted, IRBBB, sl incr voltage... IMP/PLAN>>  I explained to the pt & his wife that his seemingly sudden incr in his baseline heart rate was related to his developing atrial fibrillation w/ variable VR;  He prev had AFlutter (last 2011- see below & notes from Carlos [name]&Taylor);  I discussed the case  w/ Carlos [name] by phone & he rec starting anticoag w/ Eliquis5mgB26m continue his other meds the same, and they will call w/ time for a f/u appt w/ 2DEcho... CHADsVASc=2-3 (he will be 74 in 46mo, 646mo).  ~  July 20, 2015:  2-87mo RO787mopt is here due to wife's concern regarding his hands shaking;  At his last OV he had recurrent PAF  that pt noted on his fitbit because his usual HR in the 50s jumped up to the 80s & we added Eliquis5Bid (CHADSVASC- 3) & referred pt to Carlos [name];  He spont converted to NSR, he was seen in the [facility] 9/1- back in sinus brady, IRBBB;  They checked f/u 2DEcho (see below) & no change in meds- he denies any further episodes of incr HR and he never really had any CP/ palpit/ dizzy/ etc... Pt notes some arthritis, eg-shoulders and uses Advil, he declines Ortho...    HBP> on Aten50-1/2, Plendil10, Hyzaar100/12.5-taking 1/2;  BP= 116/68 & pulse=50 & reg; denies CP, palpit, dizzy, SOB, edema, etc...    Valv Heart Dis> on Amoxicillin Prn for SBE prophy; he last saw Carlos [name] 5/15> HxPAFlutter, mild AI&MR (last Echo 2011), doing satis/ no change- same meds...    HxAFib/Flutter, RBBB> on Eliquis5Bid; denies CP, palpit, dizzy, etc; he had f/u Carlos [name] in the past, now Carlos [name] & [facility] clinic... EXAM reveals Afeb, VSS & pulse reg/50, O2sat=97% on RA;  HEENT- neg, clears throat a lot;  Chest- clear w/o w/r/r;  Heart- reg KV4/2 SEM & diastolic murmur at base;  Abd- soft, neg;  Ext- neg w/o c/c/e; Neuro- intact, no trmor noted...  2DEcho 05/11/15 showed norm LV size & function w/ EF=60-65%, no regional wall motion abn, AoV mildly thickened & calcif w/ mod AI, Asc Ao is mildly dilated, mild post leaflet of MV prolapse w/ mild MR, mod LA dil at 52m, & mod pulmonic regurg. IMP/PLAN>>  Problems as noted- PAF/flutter controlled on meds and now on Carlos [name] per Carlos [name] & [facility] clinic (note- he has valvular heart dis);  I do not note any hand shaking/ tremor today & we will continue on observation, plan ROV in  685mo/ follow up labs etc...   ~  February 06, 2016:  6-52m98moV & Carlos [name] stable overall but mentons an intermittent tremor in his right hand, this could be early Parkinson's dis & we will refer to Neuro for their consultation;      Allergy to fire ants> he has Epi-pen for prn use...    HBP> on Aten50-1/2, Plendil10, Hyzaar100/12.5-taking 1/2;  BP= 122/66 & denies CP, palpit, dizzy, SOB, edema, etc...    Valv Heart Dis> on Amoxicillin Prn for SBE prophy; he had f/u 5/15 Carlos [name]- PAFlutter, mild AI&MR, doing satis/ no change-same meds...    AFlutter, RBBB> on ASA81; denies CP, palpit, dizzy, etc; he had f/u Carlos [name] 5/15- doing well & no need for pacer...    Chol> on Simva40, FishOil; on diet & wt stable- FLP 6/17 showed TChol 120, TG 66, HDL 58, LDL 49; continue same...    GI- polyps, hems> he remains asymptomatic w/o abd pain, n/v, c/d, blood seen; last colon 12/10 w/ divertics, 3mm26menoma, & f/u 31yrs103yr   Kidney stones> he had stone in the 1970's that required surg; last stone was 7/14 & he passed it; he remains asymptomatic w/o pain, dysuria, blood, etc;    DJD> on OTC analgesics as needed; he continues to work hard, min arthritic symptoms, using OTC meds as needed...    Tremor> we will refer to Neuro to r/o early Parkinsons...    Anxiety> doing satis, not on meds...    Skin cancer> he continues to f/u w/ Derm Q3mo..20moAM reveals Afeb, VSS, O2sat=99% on RA;  HEENT- neg, clears throat a lot;  Chest- clear w/o w/r/r;  Heart- reg gr1/6 VZ5/6 diastolic murmur at base;  Abd- soft,  neg;  Ext- neg w/o c/c/e; Neuro- intact, no trmor noted...  LABS 02/06/16>  FLP- at goals on Simva40;  Chems- wnl x BS=112;  CBC- wnl w/ Hg=16.7;  TSH=1.12;  PSA=2.24 IMP/PLAN>>  Carlos [name] is stable overall but notes tremor in right hand that could be early Parkinsons- refer to Neuro; continue other meds the same...   ~  August 07, 2016:  68moROV & Carlos [name] [name] Neuro- Carlos [name] & she confirmed the diagnosis of Parkinson's dis  (early stage) & started him on PT/ Boxing Therapy before considering sinemet rx;  He really enjoys the boxing & feels it has helped a lot; Neuro note from 03/09/16 is reviewed...    He also had f/u visit w/ [facility]- Carlos [name] 03/30/16>  Hx palpit & PAFlutter, hx brief episodes of bradycardia;  Relatively asymptomatic;  EKG showed AFib w/ controlled VR- continue same meds & f/u 647mo.    Medical issues as noted above>  Carlos [name] seek a new PCP as I cut back my practice...  EXAM reveals Afeb, VSS, O2sat=98% on RA;  HEENT- neg, clears throat a lot;  Chest- clear w/o w/r/r;  Heart- reg grXF8/1EM & diastolic murmur at base;  Abd- soft, neg;  Ext- neg w/o c/c/e; Neuro- intact, mild resting tremor right hand. IMP/PLAN>>  Carlos [name] HBP, valv heart dis (AI/MR), Afib/flutter, HL, OA, Parkinson's, etc- stable on current regimen;  Followed by Carlos [name], Carlos [name], and he will check w. DrHunter regarding PCP follow up, call for any questions...           Problem List:  HYPERTENSION (ICD-401.9) - controlled on ATENOLOL 5081m,  FELODIPINE 65m36m & LOSARTAN/HCT 100-12.5 daily... ~  see 2DEcho below> ~  CXR 4/11 showed borderline cardiomeg, clear lungs, DJD spine, NAD;  CXR 4/12 same> NAD, WNL... ~  10/11:   BP= 110/66 & similar at home; pt denies HA, fatigue, visual changes, CP, palipit, dizziness, syncope, dyspnea, edema, etc... ~  4/12:  BP= 110/84 & he remains asymptomatic... We decided to decr the ATENOLOL from 50mg57m25mg/13me to his bradycardia... ~  CXR 4/12 showed normal heart size, clear lungs, NAD... ~  10/12:  BP= 120/72, Pulse= 50 & regular; he went back on his Aten50/d; he remains asymptomatic w/o CP, palpit, dizzy, syncope, SOB, edema, etc... ~  5/13:  BP= 122/80 & he remains bradycardic but doesn't want to cut the BBlocker; denies CP, palpit, dizzy, syncope, SOB, etc. ~  11/13: on Aten50, Plendil10, Hyzaar100-12.5;  BP= 112/82 & denies CP, palpit, dizzy, SOB, edema, etc... ~  5/14: on Aten50, Plendil10,  Hyzaar100-12.5;  BP= 118/58 & denies CP, palpit, dizzy, SOB, edema, etc ~  CXR 5/14 showed heart at upper lim of norm, mild atherosclerotic changes in Ao, bilat nipple shadows, NAD ~  11/14: He remains on ASA81, Aten50, Plendil10, Hyzaar100-12.5 and BP is well controlled at 110/66 today; he feels well w/o CP, palpit, dizzy, SOB, edema, etc. ~  5/15: on Aten50, Plendil10, Hyzaar100-12.5;  BP= 118/64 & denies CP, palpit, dizzy, SOB, edema, etc ~  11/15: on Aten50-1/2, Felodip10, Hyzaar100-12.5; BP= 102/62 today & we decided to decr the Hyzaar to 1/2 tab daily; he will monitor BP at home ~  5/16: on Aten50-1/2, Plendil10, Hyzaar100/12.5-taking 1/2;  BP= 104/60 & he remains asymptomatic... ~  11/16: on same meds w/ good control of BP.  VALVULAR HEART DISEASE (ICD-424.90) - followed by Carlos [name]/Taylor- pt knows to take AMOX prior to dental work...  ~  2DEcho in 2002 w/ mild  AI, MR, TR and normal LVF... ~  2DEcho 2/09 showed LV sl dilated w/ mild DD, norm LVF w/o regional wall motion abn & EF= 55-60%;  mild AI, mild-mod MR, dil LA @ 58m... ~  NuclearStressTest 3/10 showed mild inferobas thinning, no ischemia, EF=48% w/ mild global HK... ~  2DEcho 4/10 showed mild AI & MR, mod dil LA, norm LVF w/ EF= 55-60% (Myoview= false result). ~  2DEcho 6/11 showed mod conc LVH, norm wall motion & EF= 50-55%, mild AI, mild thick MV leaflets w/ mild MR, mod LAdil= 423m ~  10[facility]12 & 11/13[facility]> doing satis, not on anticoagulation, no changes made... ~   EKG 5/15 showed SBrady, rate46, RBBB, NAD...  ~  11/15: he had f/u Carlos [name]- doing satis, asymptomatic but BP was low at 108/60 so they decr the aten50 to 1/2 tab daily... ~  11/16: he had 2DEcho 9/16 at [facility]inic- norm LV size & function w/ EF=60-65%, no regional wall motion abn, AoV mildly thickened & calcif w/ mod AI, Asc Ao is mildly dilated, mild post leaflet of MV prolapse w/ mild MR, mod LA dil at 4773m& mod pulmonic regurg.  AFIB >> ATRIAL  FLUTTER (ICD-427.32) & BUNDLE BRANCH BLOCK, RIGHT (ICD-426.4) - on ASA 325m82mand rate control strategy...  ~  prev EKG's w/ IRBBB...2/09 w/ IVCD, NSR, & QS wave in III and aVF (new)... check LVF (nl) and wall motion (nl) on echo... ~  he developed AFlutter 2011 & had evals from Carlos [name] 7/11 & Carlos [name] 10/11-  they rec rate control & ASA, w/ consideration of cath ablation if not controlled... ~  2DEcho 6/11 showed mod conc LVH, norm wall motion & EF= 50-55%, mild AI, mild thick MV leaflets w/ mild MR, mod LAdil= 42mm56m~  Holter 6/11 revealed AFlutter (ave rate 106, max=145, no pauses)... ~  5/13: he saw Carlos [name]- doing well, no symptoms, no indication for pacer...  ~  EKG 11/13 showed sinus brady, rate48, rsr', otherw wnl... ~   EKG 5/15 showed SBrady, rate46, RBBB, NAD...   Marland Kitchen  8/16: he presented w/ increased HR (noted pulse up to 80's & irreg)=> EKG w/ AFib & controlled VR where prev he'd been in SinusBrady w/ pulse~50; denied CP/ palpit/ SOB/ edema; started on Carlos [name] & referred to Cards for f/u & 2DEcho. ~  9/16: seen in [facility]inic & had spont converted to NSR- ASA changed to Eliquis5Bid & continued on other meds the same...  HYPERCHOLESTEROLEMIA (ICD-272.0) - on SIMVASTATIN 40mg/38mish Oil, & diet...  ~  FLP 1/[place]af[place]howed TChol 106, TG 59, HDL 48, LDL46 ~  FLP 2/09 showed TChol 123, TG 57, HDL 51, LDL 61 ~  FLP 2/10 showed TChol 117, TG 47, HDL 55, LDL 53 ~  FLP 4/11 showed TChol 105, TG 27, HDL 57, LDL 43 ~  FLP 4/12 on Simva40 showed TChol 104, Tg 34, HDL 51, LDL 46 ~  FLP 5/13 on Simva40 showed TChol 112, TG 39, HDL 55, LDL 49 ~  FLP 5/14 on Simva40 showed TChol 113, TG 42, HDL 52, LDL 53 ~  FLP 5/15 on Simva40 showed  TChol 97, TG 20, HDL 51, LDL 42 ~  FLP 5/16 on Simva40 showed  TChol 116, TG 48, HDL 65, LDL 41  Hx of HEMORRHOIDS (ICD-455.6) - [place]... rec f/u 5 yrs due to fam history & he asks to be seen here (  refer to GI)... ~   12/10: had f/u colonoscopy by DrStark- 34m sessile polyp in cecum= path showed fragments of tubular adenoma; +mild divertics...   RENAL CALCULUS, HX OF (ICD-V13.01) - surg in 1974 for kid stone...  ~  7/14:  CT Abd & Pelvis showed enhancement of the left kidney w/ marked perineph stranding, mod left hydroureteronephrosis, 478mleft UVJ stone, tortuous left ureter, enlarged prostatelumbar DJD...  DEGENERATIVE JOINT DISEASE (ICD-715.90) - occas neck discomfort without arm pain, numbness, etc... he is very active...  TREMOR >> noted tremor of right hand 02/2016 & refer to Neuro to r/o Parkinsons...  ANXIETY (ICD-300.00)  BASAL CELL CARCINOMA, HX OF (ICD-V10.83) - [place]~  8/12: note from DrWHoover- full body check & several AKs removed... ~  He continues to f/u w/ Derm every 68mo95mosays...  Health Maintenance: ~  GI:  DrStark w/ colon 12/10 as above... ~  GU:  4/11> DRE= neg & PSA= 1.50 ~  Immunizations:  he gets yearly Flu shots,  ?Pneumovax,  Given TDAP 5/13...   Past Surgical History:  Procedure Laterality Date  . COLONOSCOPY    . KIDNEY STONE SURGERY    . POLYPECTOMY      Outpatient Encounter Prescriptions as of 08/07/2016  Medication Sig  . amoxicillin (AMOXIL) 500 MG capsule 4 tabs by mouth 1 hr prior to dental procedures.  . atMarland Kitchennolol (TENORMIN) 50 MG tablet Take 25 mg by mouth daily.  . ELMarland KitchenQUIS 5 MG TABS tablet TAKE 1 TABLET BY MOUTH TWICE DAILY  . felodipine (PLENDIL) 10 MG 24 hr tablet TAKE 1 TABLET BY MOUTH EVERY DAY  . felodipine (PLENDIL) 10 MG 24 hr tablet TAKE 1 TABLET BY MOUTH EVERY DAY  . fish oil-omega-3 fatty acids 1000 MG capsule Take 1 g by mouth daily.    . loMarland Kitchenartan-hydrochlorothiazide (HYZAAR) 100-12.5 MG tablet TAKE 1 TABLET BY MOUTH EVERY DAY  . Multiple Vitamin (MULTIVITAMIN) tablet Take 1 tablet by mouth daily.    . simvastatin (ZOCOR) 40 MG tablet TAKE 1 TABLET BY MOUTH EVERY NIGHT AT BEDTIME   No  facility-administered encounter medications on file as of 08/07/2016.     No Known Allergies   Current Medications, Allergies, Past Medical History, Past Surgical History, Family History, and Social History were reviewed in [employer].    Review of Systems         See HPI - all other systems neg except as noted... The patient denies anorexia, fever, weight loss, weight gain, vision loss, decreased hearing, hoarseness, chest pain, syncope, dyspnea on exertion, peripheral edema, prolonged cough, headaches, hemoptysis, abdominal pain, melena, hematochezia, severe indigestion/heartburn, hematuria, incontinence, muscle weakness, suspicious skin lesions, transient blindness, difficulty walking, depression, unusual weight change, abnormal bleeding, enlarged lymph nodes, and angioedema.    Objective:   Physical Exam    WD, WN, 75 y46 WM in NAD... GENERAL:  Alert & oriented; pleasant & cooperative... HEENT:  [place]/AT, EOM-wnl, PERRLA, Fundi-benign, EACs-clear, TMs-wnl, NOSE-clear, THROAT-clear & wnl. NECK:  Supple w/ fairROM; no JVD; normal carotid impulses w/o bruits; no thyromegaly or nodules palpated; no lymphadenopathy. CHEST:  Clear to P & A; without wheezes/ rales/ or rhonchi. HEART:  irregular rhythm;  gr1/6 SEM LSB & gr1/KD3/2stolic murmur in aortic area, without rubs or gallops heard... ABDOMEN:  Soft & nontender; normal bowel sounds; no organomegaly or masses detected. (RECTAL:  Neg - prostate 2+ & nontender w/o nodules; stool hematest neg) EXT:  without deformities or arthritic changes; no varicose veins/ venous insuffic/ or edema. NEURO:  CN's intact; motor testing normal x mild tremor right hand; sensory testing normal; gait normal & balance OK. DERM:  No lesions noted; no rash etc...  RADIOLOGY DATA:  Reviewed in the EPIC EMR & discussed w/ the patient...  LABORATORY DATA:  Reviewed in the EPIC EMR & discussed w/ the patient...    Assessment & Plan:     08/07/16>   IMP/PLAN>>  Carlos [name] has HBP, valv heart dis (AI/MR), Afib/flutter, HL, OA, Parkinson's, etc- stable on current regimen;  Followed by Carlos [name], Carlos [name], and he will check w. DrHunter regarding PCP follow up, call for any questions   HBP>  BP remains on the low side despite decr Aten50&Hyzaar to 1/2 tab/d; REC to continue same for now & watch BP...  Hx AFib/Flutter, RBBB, Valv heart dis>  Followed by Carlos [name] & [facility]  CHOL>  Stable on diet + Simva40; continue same...  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  Other medical issues as noted...  No tremor noted & we will continue to observe...    Patient's Medications  New Prescriptions   No medications on file  Previous Medications   AMOXICILLIN (AMOXIL) 500 MG CAPSULE    4 tabs by mouth 1 hr prior to dental procedures.   ATENOLOL (TENORMIN) 50 MG TABLET    Take 25 mg by mouth daily.   Carlos [name] 5 MG TABS TABLET    TAKE 1 TABLET BY MOUTH TWICE DAILY   FELODIPINE (PLENDIL) 10 MG 24 HR TABLET    TAKE 1 TABLET BY MOUTH EVERY DAY   FELODIPINE (PLENDIL) 10 MG 24 HR TABLET    TAKE 1 TABLET BY MOUTH EVERY DAY   FISH OIL-OMEGA-3 FATTY ACIDS 1000 MG CAPSULE    Take 1 g by mouth daily.     LOSARTAN-HYDROCHLOROTHIAZIDE (HYZAAR) 100-12.5 MG TABLET    TAKE 1 TABLET BY MOUTH EVERY DAY   MULTIPLE VITAMIN (MULTIVITAMIN) TABLET    Take 1 tablet by mouth daily.     SIMVASTATIN (ZOCOR) 40 MG TABLET    TAKE 1 TABLET BY MOUTH EVERY NIGHT AT BEDTIME  Modified Medications   No medications on file  Discontinued Medications   No medications on file

## 2016-08-08 NOTE — Progress Notes (Signed)
Carlos [name] was seen today in the movement disorders clinic for neurologic consultation at the request of NADEL,SCOTT M, MD.  This patient is accompanied in the office by his spouse who supplements the history. The consultation is for the evaluation of tremor.  Pt states that he noted stiffness in the arm about a year ago and then 6 months ago he noted tremor in the right arm (tremor noted in med records in 07/2015).  Wife notes tremor in the R arm when driving or when stressed in traffic.  She will note in when the hand is hanging loose or if hanging across her in bed.   08/09/16 update:  The patient follows up today, accompanied by his wife and son who supplement the history.  The patient has a new diagnosis of Parkinson's disease.  He got clearance from his cardiologist and pulmonologist to participate in Parkinson's related exercises.  The patient is now participating in rock steady boxing in [place] and loves it.  He goes 3 days a week and goes to [facility] cycle class one day a week.  His family has noticed an improvement in posture and ability to ambulate.  The patient feels great.  ALLERGIES:  No Known Allergies  CURRENT MEDICATIONS:  Outpatient Encounter Prescriptions as of 08/09/2016  Medication Sig  . atenolol (TENORMIN) 50 MG tablet Take 25 mg by mouth daily.  Marland Kitchen ELIQUIS 5 MG TABS tablet TAKE 1 TABLET BY MOUTH TWICE DAILY  . felodipine (PLENDIL) 10 MG 24 hr tablet TAKE 1 TABLET BY MOUTH EVERY DAY  . felodipine (PLENDIL) 10 MG 24 hr tablet TAKE 1 TABLET BY MOUTH EVERY DAY  . fish oil-omega-3 fatty acids 1000 MG capsule Take 1 g by mouth daily.    Marland Kitchen losartan-hydrochlorothiazide (HYZAAR) 100-12.5 MG tablet TAKE 1 TABLET BY MOUTH EVERY DAY  . Multiple Vitamin (MULTIVITAMIN) tablet Take 1 tablet by mouth daily.    . simvastatin (ZOCOR) 40 MG tablet TAKE 1 TABLET BY MOUTH EVERY NIGHT AT BEDTIME  . amoxicillin (AMOXIL) 500 MG capsule 4 tabs by mouth 1 hr prior to dental procedures.   No  facility-administered encounter medications on file as of 08/09/2016.     PAST MEDICAL HISTORY:   Past Medical History:  Diagnosis Date  . Allergic rhinitis   . Allergy   . Anxiety   . Aortic insufficiency    mild... echo... 02/2010  . Atrial flutter ([place]) 02/03/2010   Consult by Dr. Lovena Le... June, 2011.... plan rate control...  Coumadin not needed... ablation if rate not controlled well  . Basal cell carcinoma   . Chest pain    Nuclear, March, 2010, no ischemia  . Degenerative joint disease   . Diastolic dysfunction    EF 50-55%... echo.. 02/2010 (55-60% echo 2009)  . Dyslipidemia   . Ejection fraction    EF 50-55%, echo, June, 2011  . Hemorrhoids   . HTN (hypertension)   . Incomplete RBBB    IRBBB; sinus bradycardia w/ PVCs  . LVH (left ventricular hypertrophy)    moderate... echo... 02/2010  . Mitral regurgitation    mild... echo.. 02/2010  . PVC (premature ventricular contraction)   . Renal calculus   . Sinus bradycardia     PAST SURGICAL HISTORY:   Past Surgical History:  Procedure Laterality Date  . COLONOSCOPY    . KIDNEY STONE SURGERY    . POLYPECTOMY      SOCIAL HISTORY:   Social History   Social History  .  Marital status: Married    Spouse name: Luian Nerison  . Number of children: 2  . Years of education: N/A   Occupational History  . retired     Risk analyst   Social History Main Topics  . Smoking status: Never Smoker  . Smokeless tobacco: Never Used  . Alcohol use 1.8 oz/week    3 Glasses of wine per week     Comment: 1-2 glasses of wine per week  . Drug use: No  . Sexual activity: Not on file   Other Topics Concern  . Not on file   Social History Narrative  . No narrative on file    FAMILY HISTORY:   Family Status  Relation Status  . Mother Deceased   stroke  . Father Deceased at age 2   "old age"  . Sister Alive   healthy  . Sister Alive   healthy  . Child Alive   2, healthy    ROS:  A complete 10 system review of  systems was obtained and was unremarkable apart from what is mentioned above.  PHYSICAL EXAMINATION:    VITALS:   Vitals:   08/09/16 1049  BP: 110/72  Pulse: 74  Weight: 179 lb (81.2 kg)  Height: 6' (1.829 m)    GEN:  The patient appears stated age and is in NAD. HEENT:  Normocephalic, atraumatic.  The mucous membranes are moist. The superficial temporal arteries are without ropiness or tenderness. CV:  RRR Lungs:  CTAB Neck/HEME:  There are no carotid bruits bilaterally.  Neurological examination:  Orientation: The patient is alert and oriented x3.  Cranial nerves: There is good facial symmetry.  The speech is fluent and clear. Soft palate rises symmetrically and there is no tongue deviation. Hearing is intact to conversational tone. Sensation: Sensation is intact to light touch throughout Motor: Strength is 5/5 in the bilateral upper and lower extremities.   Shoulder shrug is equal and symmetric.  There is no pronator drift.  Movement examination: Tone: There is normal tone in the UE/LE Abnormal movements: There is minor RUE resting tremor that increases with ambulation Coordination:  There is no decremation  with any form of RAMS, including alternating supination and pronation of the forearm, hand opening and closing, finger taps, heel taps and toe taps. Gait and Station: The patient has no difficulty arising out of a deep-seated chair without the use of the hands. The patient's stride length is normal with decreased arm swing on the right and re-emergent tremor on the R.  The patient has a negative pull test.      LABS    Chemistry      Component Value Date/Time   NA 141 02/06/2016 1223   K 3.7 02/06/2016 1223   CL 101 02/06/2016 1223   CO2 32 02/06/2016 1223   BUN 16 02/06/2016 1223   CREATININE 0.85 02/06/2016 1223      Component Value Date/Time   CALCIUM 10.0 02/06/2016 1223   ALKPHOS 67 02/06/2016 1223   AST 22 02/06/2016 1223   ALT 23 02/06/2016 1223    BILITOT 1.3 (H) 02/06/2016 1223     Lab Results  Component Value Date   WBC 7.4 02/06/2016   HGB 16.7 02/06/2016   HCT 49.7 02/06/2016   MCV 89.0 02/06/2016   PLT 164.0 02/06/2016   Lab Results  Component Value Date   TSH 1.12 02/06/2016   No results found for: VITAMINB12   ASSESSMENT/PLAN:  1.  Idiopathic Parkinson's  disease.  He has Hoehn and Yoehr stage 1 disease.  -Congratulated the patient on his exercise, including rock steady boxing.  -talked about prognosis/pathophys/future expectations in detail as patients son not present at previous visit  -offered medication but patient wishes to hold  2.  HTN  -on lopressor.  May be on this for aflutter/afib.  Will need to closely watch his BP as it is already very low but he is asymptommatic.    3.  Follow up is anticipated in the next few months, sooner should new neurologic issues arise.  Much greater than 50% of this visit was spent in counseling and coordinating care.  Total face to face time:  35 min

## 2016-08-09 ENCOUNTER — Ambulatory Visit (INDEPENDENT_AMBULATORY_CARE_PROVIDER_SITE_OTHER): Payer: Medicare Other | Admitting: Neurology

## 2016-08-09 ENCOUNTER — Encounter: Payer: Self-pay | Admitting: Neurology

## 2016-08-09 VITALS — BP 110/72 | HR 74 | Ht 72.0 in | Wt 179.0 lb

## 2016-08-09 DIAGNOSIS — G20 Parkinson's disease: Secondary | ICD-10-CM

## 2016-08-13 ENCOUNTER — Other Ambulatory Visit: Payer: Self-pay | Admitting: Pulmonary Disease

## 2016-08-20 ENCOUNTER — Other Ambulatory Visit: Payer: Self-pay | Admitting: Pulmonary Disease

## 2016-08-21 ENCOUNTER — Telehealth: Payer: Self-pay | Admitting: Pulmonary Disease

## 2016-08-21 MED ORDER — LOSARTAN POTASSIUM-HCTZ 100-12.5 MG PO TABS: 1.0000 | ORAL_TABLET | Freq: Every day | ORAL | 5 refills | Status: DC

## 2016-08-21 MED ORDER — APIXABAN 5 MG PO TABS: 5.0000 mg | ORAL_TABLET | Freq: Two times a day (BID) | ORAL | 5 refills | Status: DC

## 2016-08-21 NOTE — Telephone Encounter (Signed)
Pt requesting refills on losartan and eliquis.  This has been sent to preferred pharmacy.  Nothing further needed.

## 2016-08-22 ENCOUNTER — Telehealth: Payer: Self-pay | Admitting: Pulmonary Disease

## 2016-08-22 NOTE — Telephone Encounter (Signed)
Spoke with patient regarding his last Tdap. His tdap was in 2013 and he would not need another one until 2023. Patient verbalized understanding.

## 2016-09-05 ENCOUNTER — Ambulatory Visit (INDEPENDENT_AMBULATORY_CARE_PROVIDER_SITE_OTHER): Payer: Medicare Other | Admitting: Internal Medicine

## 2016-09-05 ENCOUNTER — Encounter (INDEPENDENT_AMBULATORY_CARE_PROVIDER_SITE_OTHER): Payer: Self-pay

## 2016-09-05 ENCOUNTER — Encounter: Payer: Self-pay | Admitting: Internal Medicine

## 2016-09-05 VITALS — BP 104/70 | HR 78 | Ht 72.0 in | Wt 180.4 lb

## 2016-09-05 DIAGNOSIS — I480 Paroxysmal atrial fibrillation: Secondary | ICD-10-CM | POA: Diagnosis not present

## 2016-09-05 NOTE — Patient Instructions (Signed)
Medication Instructions:  Your physician recommends that you continue on your current medications as directed. Please refer to the Current Medication list given to you today.   Labwork: None Ordered   Testing/Procedures: None Ordered   Follow-Up: Your physician wants you to follow-up in: 1 year with Dr. Lovena Le. You will receive a reminder letter in the mail two months in advance. If you don't receive a letter, please call our office to schedule the follow-up appointment.    Any Other Special Instructions Will Be Listed Below (If Applicable).

## 2016-09-05 NOTE — Progress Notes (Signed)
HPI [name] returns today for followup. He is a very pleasant 76 year old man with palpitations and documented paroxysmal atrial flutter as well as brief episodes of bradycardia. He has been nearly asymptomatic. He denies chest pain, shortness of breath, and syncope. He has retired from [employer]. He has been diagnosed with early Parkinson's disease. He has minimal tremor. He has minimal palpitations and is about to start neuro rehab which has gone well. He is boxing. He also walks regularly. Marland KitchenNo Known Allergies   Current Outpatient Prescriptions  Medication Sig Dispense Refill  . amoxicillin (AMOXIL) 500 MG capsule 4 tabs by mouth 1 hr prior to dental procedures.    Marland Kitchen apixaban (ELIQUIS) 5 MG TABS tablet Take 1 tablet (5 mg total) by mouth 2 (two) times daily. 60 tablet 5  . atenolol (TENORMIN) 50 MG tablet Take 25 mg by mouth daily.    . felodipine (PLENDIL) 10 MG 24 hr tablet TAKE 1 TABLET BY MOUTH EVERY DAY 90 tablet 1  . felodipine (PLENDIL) 10 MG 24 hr tablet TAKE 1 TABLET BY MOUTH EVERY DAY 90 tablet 0  . fish oil-omega-3 fatty acids 1000 MG capsule Take 1 g by mouth daily.      Marland Kitchen losartan-hydrochlorothiazide (HYZAAR) 100-12.5 MG tablet Take 1 tablet by mouth daily. 30 tablet 5  . Multiple Vitamin (MULTIVITAMIN) tablet Take 1 tablet by mouth daily.      . simvastatin (ZOCOR) 40 MG tablet TAKE 1 TABLET BY MOUTH EVERY NIGHT AT BEDTIME 90 tablet 0   No current facility-administered medications for this visit.      Past Medical History:  Diagnosis Date  . Allergic rhinitis   . Allergy   . Anxiety   . Aortic insufficiency    mild... echo... 02/2010  . Atrial flutter ([place]) 02/03/2010   Consult by Dr. Lovena Le... June, 2011.... plan rate control...  Coumadin not needed... ablation if rate not controlled well  . Basal cell carcinoma   . Chest pain    Nuclear, March, 2010, no ischemia  . Degenerative joint disease   . Diastolic dysfunction    EF 50-55%... echo.. 02/2010  (55-60% echo 2009)  . Dyslipidemia   . Ejection fraction    EF 50-55%, echo, June, 2011  . Hemorrhoids   . HTN (hypertension)   . Incomplete RBBB    IRBBB; sinus bradycardia w/ PVCs  . LVH (left ventricular hypertrophy)    moderate... echo... 02/2010  . Mitral regurgitation    mild... echo.. 02/2010  . PVC (premature ventricular contraction)   . Renal calculus   . Sinus bradycardia     ROS:   All systems reviewed and negative except as noted in the HPI.   Past Surgical History:  Procedure Laterality Date  . COLONOSCOPY    . KIDNEY STONE SURGERY    . POLYPECTOMY       Family History  Problem Relation Age of Onset  . Hypertension Mother   . Colon cancer Paternal Aunt   . Colon cancer Paternal Uncle   . Stroke Mother 31     Social History   Social History  . Marital status: Married    Spouse name: Carlos [name]  . Number of children: 2  . Years of education: N/A   Occupational History  . retired     Risk analyst   Social History Main Topics  . Smoking status: Never Smoker  . Smokeless tobacco: Never Used  . Alcohol use 1.8 oz/week    3  Glasses of wine per week     Comment: 1-2 glasses of wine per week  . Drug use: No  . Sexual activity: Not on file   Other Topics Concern  . Not on file   Social History Narrative  . No narrative on file     BP 104/70   Pulse 78   Ht 6' (1.829 m)   Wt 180 lb 6.4 oz (81.8 kg)   BMI 24.47 kg/m   Physical Exam:  Well appearing 76 yo man, NAD HEENT: Unremarkable Neck:  6 cm JVD, no thyromegally Lymphatics:  No adenopathy Back:  No CVA tenderness Lungs:  Clear with no wheezes.  HEART:  IRegular rate and rhythm, no murmurs, no rubs, no clicks Abd:  soft, positive bowel sounds, no organomegally, no rebound, no guarding Ext:  2 plus pulses, no edema, no cyanosis, no clubbing Skin:  No rashes no nodules Neuro:  CN II through XII intact, motor grossly intact  ECG - none today   Assess/Plan:  1. Persistent  atrial fib/flutter - he appears to be in atrial fib today and his rate appears to be well controlled. He will continue his current meds. 2. HTN - his blood pressure is well controlled. We discussed the possibility that he might eventually not need as much blood pressure lowering. For now will not reduce his meds. Would stop felodipine first if blood pressure goes down. 3. Dyslipidemia - he will continue his statin therapy 4. Parkinson's disease - he has very mild symptoms with minimal resting tremor. We discussed exercise. I have recommended he participate in any exercise program available to him. He should exercise such that he can talk while exercising.  Carlos [name].D.

## 2016-10-29 ENCOUNTER — Other Ambulatory Visit: Payer: Self-pay | Admitting: Pulmonary Disease

## 2016-11-08 DIAGNOSIS — L570 Actinic keratosis: Secondary | ICD-10-CM | POA: Diagnosis not present

## 2016-11-08 DIAGNOSIS — Z85828 Personal history of other malignant neoplasm of skin: Secondary | ICD-10-CM | POA: Diagnosis not present

## 2016-11-08 DIAGNOSIS — Z08 Encounter for follow-up examination after completed treatment for malignant neoplasm: Secondary | ICD-10-CM | POA: Diagnosis not present

## 2016-11-16 ENCOUNTER — Other Ambulatory Visit: Payer: Self-pay | Admitting: Pulmonary Disease

## 2016-12-07 ENCOUNTER — Ambulatory Visit (INDEPENDENT_AMBULATORY_CARE_PROVIDER_SITE_OTHER): Payer: Medicare Other | Admitting: Family Medicine

## 2016-12-07 ENCOUNTER — Encounter: Payer: Self-pay | Admitting: Family Medicine

## 2016-12-07 VITALS — BP 124/82 | HR 98 | Temp 97.4°F | Ht 74.0 in | Wt 181.6 lb

## 2016-12-07 DIAGNOSIS — I10 Essential (primary) hypertension: Secondary | ICD-10-CM

## 2016-12-07 DIAGNOSIS — G20 Parkinson's disease: Secondary | ICD-10-CM | POA: Diagnosis not present

## 2016-12-07 DIAGNOSIS — E785 Hyperlipidemia, unspecified: Secondary | ICD-10-CM

## 2016-12-07 DIAGNOSIS — Z0000 Encounter for general adult medical examination without abnormal findings: Secondary | ICD-10-CM | POA: Diagnosis not present

## 2016-12-07 DIAGNOSIS — I483 Typical atrial flutter: Secondary | ICD-10-CM

## 2016-12-07 DIAGNOSIS — Z23 Encounter for immunization: Secondary | ICD-10-CM | POA: Diagnosis not present

## 2016-12-07 DIAGNOSIS — T7840XA Allergy, unspecified, initial encounter: Secondary | ICD-10-CM | POA: Insufficient documentation

## 2016-12-07 NOTE — Progress Notes (Signed)
Subjective:   Carlos [name] is a 76 y.o. male who presents for Medicare Annual/Subsequent preventive examination.  The Patient was informed that the wellness visit is to identify future health risk and educate and initiate measures that can reduce risk for increased disease through the lifespan.    NO ROS; Medicare Wellness Visit  Describes health as good, fair or great? Good  Dx with Parkinson's last year Is working hard to keep is tone and has a great attitude   Preventive Screening -Counseling & Management  Colonoscopy 2015; and may do or not do again Had distant relatives with colon cancer;  [place]  Repeat 08/2019 but will review at this time for decision   Smoking history/ no ETOH one a week   Medication adherence or issues? no   RISK FACTORS Diet Wife is great cook Does a lot of greens, vegetables. Avoids fats;   Regular exercise  3 days a week walk 3 miles 2 days a week you bike 2 days a week he does boxing And this is helping him    Cardiac Risk Factors: PAF under control   Advanced aged > 37 in men; >65 in women Hyperlipidemia; is perfect with ratio of 2 Diabetes Glucose was 112  Family History HTN, stroke Obesity neg  Fall risk no Given education on "Fall Prevention in the Home" for more safety tips the patient can apply as appropriate.  Long term goal is to "age in place"  3 level home but doing well at this time   Mobility of Functional changes this year? Is working hard to keep his function  Mental Health:  Any emotional problems? Anxious, depressed, irritable, sad or blue? no Denies feeling depressed or hopeless; voices pleasure in daily life How many social activities have you been engaged in within the last 2 weeks?no    Hearing Screening   125Hz  250Hz  500Hz  1000Hz  2000Hz  3000Hz  4000Hz  6000Hz  8000Hz   Right ear:     100      Left ear:     100      Vision Screening Comments: Last vision check x 76 yo. Up for one [employer] No  issues  Reading glasses     Activities of Daily Living - See functional screen   Cognitive testing; Short term;  Word finding  Still takes care of his meds Wife does bookkeeping  No issues interfering with his daily living  Ad8 score; 0 or less than 2  MMSE deferred or completed if AD8 + 2 issues  Advanced Directives they have  Will try to bring a copy for the chart   Patient Care Team: [name], MD as PCP - General (Family Medicine)    Immunization History  Administered Date(s) Administered  . Influenza Split 06/24/2011, 06/03/2012, 06/08/2013, 07/12/2014  . Influenza Whole 10/23/2009, 06/28/2010  . Influenza, High Dose Seasonal PF 07/09/2016  . Influenza-Unspecified 07/06/2015  . Pneumococcal Conjugate-13 07/19/2014  . Pneumococcal Polysaccharide-23 12/07/2016  . Tdap 09/03/2009, 01/02/2012   Required Immunizations needed today  Screening test up to date or reviewed for plan of completion. Pneumovax 23 given today  Cardiac Risk Factors include: advanced age (>7men, >68 women);hypertension;male gender     Objective:    Vitals: BP 124/82 (BP Location: Left Arm, Patient Position: Sitting, Cuff Size: Normal)   Pulse 98   Temp 97.4 F (36.3 C) (Oral)   Ht 6\' 2"  (1.88 m)   Wt 181 lb 9.6 oz (82.4 kg)   SpO2  96%   BMI 23.32 kg/m   Body mass index is 23.32 kg/m.  Tobacco History  Smoking Status  . Never Smoker  Smokeless Tobacco  . Never Used     Counseling given: Yes   Past Medical History:  Diagnosis Date  . Allergic rhinitis   . Allergy   . Anxiety   . Aortic insufficiency    mild... echo... 02/2010  . Atrial flutter ([place]) 02/03/2010   Consult by Dr. Lovena Le... June, 2011.... plan rate control...  Coumadin not needed... ablation if rate not controlled well  . Basal cell carcinoma   . Chest pain    Nuclear, March, 2010, no ischemia  . Degenerative joint disease   . Diastolic dysfunction    EF 50-55%... echo.. 02/2010 (55-60% echo 2009)  .  Dyslipidemia   . Ejection fraction    EF 50-55%, echo, June, 2011  . Hemorrhoids   . HEMORRHOIDS 10/21/2007   Qualifier: History of  By: [name], [name]   . HTN (hypertension)   . Incomplete RBBB    IRBBB; sinus bradycardia w/ PVCs  . LVH (left ventricular hypertrophy)    moderate... echo... 02/2010  . Mitral regurgitation    mild... echo.. 02/2010  . PVC (premature ventricular contraction)   . Renal calculus   . Sinus bradycardia    Past Surgical History:  Procedure Laterality Date  . COLONOSCOPY    . KIDNEY STONE SURGERY    . POLYPECTOMY     Family History  Problem Relation Age of Onset  . Hypertension Mother   . Stroke Mother 77    lived to 76  . Other Father     parkinsons ? 84  . Colon cancer Paternal Aunt   . Colon cancer Paternal Uncle    History  Sexual Activity  . Sexual activity: Not on file    Outpatient Encounter Prescriptions as of 12/07/2016  Medication Sig  . amoxicillin (AMOXIL) 500 MG capsule 4 tabs by mouth 1 hr prior to dental procedures.  Marland Kitchen apixaban (ELIQUIS) 5 MG TABS tablet Take 1 tablet (5 mg total) by mouth 2 (two) times daily.  Marland Kitchen atenolol (TENORMIN) 50 MG tablet Take 25 mg by mouth daily.  . felodipine (PLENDIL) 10 MG 24 hr tablet TAKE 1 TABLET BY MOUTH EVERY DAY  . felodipine (PLENDIL) 10 MG 24 hr tablet TAKE 1 TABLET BY MOUTH EVERY DAY  . losartan-hydrochlorothiazide (HYZAAR) 100-12.5 MG tablet Take 1 tablet by mouth daily.  . simvastatin (ZOCOR) 40 MG tablet TAKE 1 TABLET BY MOUTH EVERY NIGHT AT BEDTIME  . fish oil-omega-3 fatty acids 1000 MG capsule Take 1 g by mouth daily.    . Multiple Vitamin (MULTIVITAMIN) tablet Take 1 tablet by mouth daily.     No facility-administered encounter medications on file as of 12/07/2016.     Activities of Daily Living In your present state of health, do you have any difficulty performing the following activities: 12/07/2016  Hearing? N  Vision? N  Difficulty concentrating or making decisions? N  Walking  or climbing stairs? N  Dressing or bathing? N  Doing errands, shopping? N  Preparing Food and eating ? N  Using the Toilet? N  In the past six months, have you accidently leaked urine? N  Do you have problems with loss of bowel control? N  Managing your Medications? N  Managing your Finances? N  Housekeeping or managing your Housekeeping? N  Some recent data might be hidden    Patient Care  Team: [name], MD as PCP - General (Family Medicine)   Assessment:     Exercise Activities and Dietary recommendations Current Exercise Habits: Structured exercise class;Home exercise routine, Type of exercise: strength training/weights;walking;Other - see comments, Time (Minutes): 60, Frequency (Times/Week): 4, Weekly Exercise (Minutes/Week): 240, Intensity: Moderate  Goals    . patient          To try maintain what you are doing now       Fall Risk Fall Risk  12/07/2016 12/07/2016 03/09/2016 01/13/2013  Falls in the past year? No No No No   Depression Screen PHQ 2/9 Scores 12/07/2016 12/07/2016 01/20/2014 01/13/2013  PHQ - 2 Score 0 0 0 0    Cognitive Function MMSE - Mini Mental State Exam 12/07/2016  Not completed: (No Data)   Ad8 score is 0       Immunization History  Administered Date(s) Administered  . Influenza Split 06/24/2011, 06/03/2012, 06/08/2013, 07/12/2014  . Influenza Whole 10/23/2009, 06/28/2010  . Influenza, High Dose Seasonal PF 07/09/2016  . Influenza-Unspecified 07/06/2015  . Pneumococcal Conjugate-13 07/19/2014  . Pneumococcal Polysaccharide-23 12/07/2016  . Tdap 09/03/2009, 01/02/2012   Screening Tests Health Maintenance  Topic Date Due  . PNA vac Low Risk Adult (2 of 2 - PPSV23) 07/20/2015  . INFLUENZA VACCINE  04/03/2017  . COLONOSCOPY  08/21/2019  . TETANUS/TDAP  01/01/2022       Plan:      PCP Notes  Health Maintenance Took his PSV 23 today Will bring a copy of his Advanced Directive   Abnormal Screens / hearing 2000- with high pitch  hearing loss but not interfering with his daily function at this time. Educated regarding medicare coverage for a hearing screen and can refer if he decides to be evaluated   Referrals   Patient concerns; none  Nurse Concerns; none  Next PCP apt was today     During the course of the visit the patient was educated and counseled about the following appropriate screening and preventive services:   Vaccines to include Pneumoccal, Influenza, Hepatitis B, Td, Zostavax, HCV  Electrocardiogram  Cardiovascular Disease  Colorectal cancer screening  Diabetes screening neg  Prostate Cancer Screening  Glaucoma screening to schedule an eye exam and will bring Dr. [name] the report or have it sent   Nutrition counseling   Smoking cessation counseling  Patient Instructions (the written plan) was given to the patient.    VOJJK,[email or web address], RN  12/07/2016  I have reviewed and agree with note, evaluation, plan.   Garret Reddish, MD

## 2016-12-07 NOTE — Patient Instructions (Addendum)
Pneumovax 23 (final pneumonia shot today)- Carlos [name] can give you this.   Great to meet you! Dr. [name]  Seeing you back in 6 months would be a good time to follow up. If you have not had a physical in the last year- could schedule this as physical _______________________________________________________ WE NOW OFFER   [facility] FAST TRACK!!!  SAME DAY Appointments for ACUTE CARE  Such as: Sprains, Injuries, cuts, abrasions, rashes, muscle pain, joint pain, back pain Colds, flu, sore throats, headache, allergies, cough, fever  Ear pain, sinus and eye infections Abdominal pain, nausea, vomiting, diarrhea, upset stomach Animal/insect bites  3 Easy Ways to Schedule: Walk-In Scheduling Call in scheduling Mychart Sign-up: [email or web address]   Carlos [name] , Thank you for taking time to come for your Medicare Wellness Visit. I appreciate your ongoing commitment to your health goals. Please review the following plan we discussed and let me know if I can assist you in the future.   If you can, bring a copy of your Living will to the doctors office to scan to chart   You took your psv 23 pneumonia vaccine today  These are the goals we discussed: Goals    . patient          To try maintain what you are doing now        This is a list of the screening recommended for you and due dates:  Health Maintenance  Topic Date Due  . Pneumonia vaccines (2 of 2 - PPSV23) 07/20/2015  . Flu Shot  04/03/2017  . Colon Cancer Screening  08/21/2019  . Tetanus Vaccine  01/01/2022     Health Maintenance, Male A healthy lifestyle and preventive care is important for your health and wellness. Ask your health care provider about what schedule of regular examinations is right for you. What should I know about weight and diet?  Eat a Healthy Diet  Eat plenty of vegetables, fruits, whole grains, low-fat dairy products, and lean protein.  Do not eat a lot of foods high in solid  fats, added sugars, or salt. Maintain a Healthy Weight  Regular exercise can help you achieve or maintain a healthy weight. You should:  Do at least 150 minutes of exercise each week. The exercise should increase your heart rate and make you sweat (moderate-intensity exercise).  Do strength-training exercises at least twice a week. Watch Your Levels of Cholesterol and Blood Lipids  Have your blood tested for lipids and cholesterol every 5 years starting at 77 years of age. If you are at high risk for heart disease, you should start having your blood tested when you are 77 years old. You may need to have your cholesterol levels checked more often if:  Your lipid or cholesterol levels are high.  You are older than 77 years of age.  You are at high risk for heart disease. What should I know about cancer screening? Many types of cancers can be detected early and may often be prevented. Lung Cancer  You should be screened every year for lung cancer if:  You are a current smoker who has smoked for at least 30 years.  You are a former smoker who has quit within the past 15 years.  Talk to your health care provider about your screening options, when you should start screening, and how often you should be screened. Colorectal Cancer  Routine colorectal cancer screening usually begins at 77 years of age and should be repeated every 5-10 years  until you are 77 years old. You may need to be screened more often if early forms of precancerous polyps or small growths are found. Your health care provider may recommend screening at an earlier age if you have risk factors for colon cancer.  Your health care provider may recommend using home test kits to check for hidden blood in the stool.  A small camera at the end of a tube can be used to examine your colon (sigmoidoscopy or colonoscopy). This checks for the earliest forms of colorectal cancer. Prostate and Testicular Cancer  Depending on your  age and overall health, your health care provider may do certain tests to screen for prostate and testicular cancer.  Talk to your health care provider about any symptoms or concerns you have about testicular or prostate cancer. Skin Cancer  Check your skin from head to toe regularly.  Tell your health care provider about any new moles or changes in moles, especially if:  There is a change in a mole's size, shape, or color.  You have a mole that is larger than a pencil eraser.  Always use sunscreen. Apply sunscreen liberally and repeat throughout the day.  Protect yourself by wearing long sleeves, pants, a wide-brimmed hat, and sunglasses when outside. What should I know about heart disease, diabetes, and high blood pressure?  If you are 47-74 years of age, have your blood pressure checked every 3-5 years. If you are 45 years of age or older, have your blood pressure checked every year. You should have your blood pressure measured twice-once when you are at a hospital or clinic, and once when you are not at a hospital or clinic. Record the average of the two measurements. To check your blood pressure when you are not at a hospital or clinic, you can use:  An automated blood pressure machine at a pharmacy.  A home blood pressure monitor.  Talk to your health care provider about your target blood pressure.  If you are between 74-67 years old, ask your health care provider if you should take aspirin to prevent heart disease.  Have regular diabetes screenings by checking your fasting blood sugar level.  If you are at a normal weight and have a low risk for diabetes, have this test once every three years after the age of 40.  If you are overweight and have a high risk for diabetes, consider being tested at a younger age or more often.  A one-time screening for abdominal aortic aneurysm (AAA) by ultrasound is recommended for men aged 94-75 years who are current or former smokers. What  should I know about preventing infection? Hepatitis B  If you have a higher risk for hepatitis B, you should be screened for this virus. Talk with your health care provider to find out if you are at risk for hepatitis B infection. Hepatitis C  Blood testing is recommended for:  Everyone born from [birth year] through 1966.  Anyone with known risk factors for hepatitis C. Sexually Transmitted Diseases (STDs)  You should be screened each year for STDs including gonorrhea and chlamydia if:  You are sexually active and are younger than 77 years of age.  You are older than 77 years of age and your health care provider tells you that you are at risk for this type of infection.  Your sexual activity has changed since you were last screened and you are at an increased risk for chlamydia or gonorrhea. Ask your health care provider if  you are at risk.  Talk with your health care provider about whether you are at high risk of being infected with HIV. Your health care provider may recommend a prescription medicine to help prevent HIV infection. What else can I do?  Schedule regular health, dental, and eye exams.  Stay current with your vaccines (immunizations).  Do not use any tobacco products, such as cigarettes, chewing tobacco, and e-cigarettes. If you need help quitting, ask your health care provider.  Limit alcohol intake to no more than 2 drinks per day. One drink equals 12 ounces of beer, 5 ounces of wine, or 1 ounces of hard liquor.  Do not use street drugs.  Do not share needles.  Ask your health care provider for help if you need support or information about quitting drugs.  Tell your health care provider if you often feel depressed.  Tell your health care provider if you have ever been abused or do not feel safe at home. This information is not intended to replace advice given to you by your health care provider. Make sure you discuss any questions you have with your health care  provider. Document Released: 02/16/2008 Document Revised: 04/18/2016 Document Reviewed: 05/24/2015 Elsevier Interactive Patient Education  2017 [place] Prevention in the Home Falls can cause injuries and can affect people from all age groups. There are many simple things that you can do to make your home safe and to help prevent falls. What can I do on the outside of my home?  Regularly repair the edges of walkways and driveways and fix any cracks.  Remove high doorway thresholds.  Trim any shrubbery on the main path into your home.  Use bright outdoor lighting.  Clear walkways of debris and clutter, including tools and rocks.  Regularly check that handrails are securely fastened and in good repair. Both sides of any steps should have handrails.  Install guardrails along the edges of any raised decks or porches.  Have leaves, snow, and ice cleared regularly.  Use sand or salt on walkways during winter months.  In the garage, clean up any spills right away, including grease or oil spills. What can I do in the bathroom?  Use night lights.  Install grab bars by the toilet and in the tub and shower. Do not use towel bars as grab bars.  Use non-skid mats or decals on the floor of the tub or shower.  If you need to sit down while you are in the shower, use a plastic, non-slip stool.  Keep the floor dry. Immediately clean up any water that spills on the floor.  Remove soap buildup in the tub or shower on a regular basis.  Attach bath mats securely with double-sided non-slip rug tape.  Remove throw rugs and other tripping hazards from the floor. What can I do in the bedroom?  Use night lights.  Make sure that a bedside light is easy to reach.  Do not use oversized bedding that drapes onto the floor.  Have a firm chair that has side arms to use for getting dressed.  Remove throw rugs and other tripping hazards from the floor. What can I do in the  kitchen?  Clean up any spills right away.  Avoid walking on wet floors.  Place frequently used items in easy-to-reach places.  If you need to reach for something above you, use a sturdy step stool that has a grab bar.  Keep electrical cables out of the way.  Do not use floor polish or wax that makes floors slippery. If you have to use wax, make sure that it is non-skid floor wax.  Remove throw rugs and other tripping hazards from the floor. What can I do in the stairways?  Do not leave any items on the stairs.  Make sure that there are handrails on both sides of the stairs. Fix handrails that are broken or loose. Make sure that handrails are as long as the stairways.  Check any carpeting to make sure that it is firmly attached to the stairs. Fix any carpet that is loose or worn.  Avoid having throw rugs at the top or bottom of stairways, or secure the rugs with carpet tape to prevent them from moving.  Make sure that you have a light switch at the top of the stairs and the bottom of the stairs. If you do not have them, have them installed. What are some other fall prevention tips?  Wear closed-toe shoes that fit well and support your feet. Wear shoes that have rubber soles or low heels.  When you use a stepladder, make sure that it is completely opened and that the sides are firmly locked. Have someone hold the ladder while you are using it. Do not climb a closed stepladder.  Add color or contrast paint or tape to grab bars and handrails in your home. Place contrasting color strips on the first and last steps.  Use mobility aids as needed, such as canes, walkers, scooters, and crutches.  Turn on lights if it is dark. Replace any light bulbs that burn out.  Set up furniture so that there are clear paths. Keep the furniture in the same spot.  Fix any uneven floor surfaces.  Choose a carpet design that does not hide the edge of steps of a stairway.  Be aware of any and all  pets.  Review your medicines with your healthcare provider. Some medicines can cause dizziness or changes in blood pressure, which increase your risk of falling. Talk with your health care provider about other ways that you can decrease your risk of falls. This may include working with a physical therapist or trainer to improve your strength, balance, and endurance. This information is not intended to replace advice given to you by your health care provider. Make sure you discuss any questions you have with your health care provider. Document Released: 08/10/2002 Document Revised: 01/17/2016 Document Reviewed: 09/24/2014 Elsevier Interactive Patient Education  2017 [employer].

## 2016-12-07 NOTE — Assessment & Plan Note (Signed)
S:Dr. Carles Collet. No rx as of 12/07/16.  Initial detection 02/06/16 with resting tremor He is really enjoying his boxing and cycling classes A/P: continue lifestyle modification and has upcoming follow up with Dr. Carles Collet to consider medication

## 2016-12-07 NOTE — Assessment & Plan Note (Signed)
S: well controlled on simvastatin 40mg . No myalgias.  Lab Results  Component Value Date   CHOL 120 02/06/2016   HDL 58.00 02/06/2016   LDLCALC 49 02/06/2016   TRIG 66.0 02/06/2016   CHOLHDL 2 02/06/2016   A/P: continue current rx

## 2016-12-07 NOTE — Progress Notes (Signed)
Phone: [phone]  Subjective:  Patient presents today to establish care with me as their new primary care provider. Patient was formerly a patient of Dr. Lenna Gilford. Chief complaint-noted.   See problem oriented charting ROS- tremor resting. No chest pain. No shortness of breath. No edema.   The following were reviewed and entered/updated in epic: Past Medical History:  Diagnosis Date  . Allergic rhinitis   . Allergy   . Anxiety   . Aortic insufficiency    mild... echo... 02/2010  . Atrial flutter ([place]) 02/03/2010   Consult by Dr. Lovena Le... June, 2011.... plan rate control...  Coumadin not needed... ablation if rate not controlled well  . Basal cell carcinoma   . Chest pain    Nuclear, March, 2010, no ischemia  . Degenerative joint disease   . Diastolic dysfunction    EF 50-55%... echo.. 02/2010 (55-60% echo 2009)  . Dyslipidemia   . Ejection fraction    EF 50-55%, echo, June, 2011  . Hemorrhoids   . HEMORRHOIDS 10/21/2007   Qualifier: History of  By: [name], [name]   . HTN (hypertension)   . Incomplete RBBB    IRBBB; sinus bradycardia w/ PVCs  . LVH (left ventricular hypertrophy)    moderate... echo... 02/2010  . Mitral regurgitation    mild... echo.. 02/2010  . PVC (premature ventricular contraction)   . Renal calculus   . Sinus bradycardia    Patient Active Problem List   Diagnosis Date Noted  . Parkinson's disease ([place]) 03/09/2016    Priority: High  . Atrial flutter ([place]) 02/03/2010    Priority: High  . Aortic insufficiency 07/20/2015    Priority: Medium  . Chest pain     Priority: Medium  . Essential hypertension     Priority: Medium  . Mitral regurgitation     Priority: Medium  . Diastolic dysfunction     Priority: Medium  . Dyslipidemia     Priority: Medium  . Allergy 12/07/2016    Priority: Low  . Sinus node dysfunction ([facility]) 08/02/2015    Priority: Low  . Hx of colonic polyps 01/13/2013    Priority: Low  . Incomplete RBBB     Priority: Low  .  Ejection fraction     Priority: Low  . LVH (left ventricular hypertrophy)     Priority: Low  . Sinus bradycardia     Priority: Low  . PVC (premature ventricular contraction)     Priority: Low  . Allergic rhinitis 12/28/2009    Priority: Low  . History of basal cell carcinoma of skin 10/26/2008    Priority: Low  . Anxiety state 10/21/2007    Priority: Low  . Osteoarthritis 10/21/2007    Priority: Low  . RENAL CALCULUS, HX OF 10/20/2007    Priority: Low   Past Surgical History:  Procedure Laterality Date  . COLONOSCOPY    . KIDNEY STONE SURGERY    . POLYPECTOMY      Family History  Problem Relation Age of Onset  . Hypertension Mother   . Stroke Mother 26    lived to 63  . Other Father     parkinsons ? 84  . Colon cancer Paternal Aunt   . Colon cancer Paternal Uncle     Medications- reviewed and updated Current Outpatient Prescriptions  Medication Sig Dispense Refill  . amoxicillin (AMOXIL) 500 MG capsule 4 tabs by mouth 1 hr prior to dental procedures.    Marland Kitchen apixaban (ELIQUIS) 5 MG TABS tablet Take  1 tablet (5 mg total) by mouth 2 (two) times daily. 60 tablet 5  . atenolol (TENORMIN) 50 MG tablet Take 25 mg by mouth daily.    . felodipine (PLENDIL) 10 MG 24 hr tablet TAKE 1 TABLET BY MOUTH EVERY DAY 90 tablet 1  . felodipine (PLENDIL) 10 MG 24 hr tablet TAKE 1 TABLET BY MOUTH EVERY DAY 90 tablet 1  . losartan-hydrochlorothiazide (HYZAAR) 100-12.5 MG tablet Take 1 tablet by mouth daily. 30 tablet 5  . simvastatin (ZOCOR) 40 MG tablet TAKE 1 TABLET BY MOUTH EVERY NIGHT AT BEDTIME 90 tablet 0  . fish oil-omega-3 fatty acids 1000 MG capsule Take 1 g by mouth daily.      . Multiple Vitamin (MULTIVITAMIN) tablet Take 1 tablet by mouth daily.       No current facility-administered medications for this visit.     Allergies-reviewed and updated No Known Allergies  Social History   Social History  . Marital status: Married    Spouse name: Lorik Guo  . Number of  children: 2  . Years of education: N/A   Occupational History  . retired     Risk analyst   Social History Main Topics  . Smoking status: Never Smoker  . Smokeless tobacco: Never Used  . Alcohol use 0.0 - 0.6 oz/week  . Drug use: No  . Sexual activity: Not Asked   Other Topics Concern  . None   Social History Narrative   Married- wife patient of Dr. [name]. Son and daughter in 56s in 2018. 51 60 year old grandchild.  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 Active with parkinsons exercise class- boxing and cycling    Objective: BP 124/82 (BP Location: Left Arm, Patient Position: Sitting, Cuff Size: Normal)   Pulse 98   Temp 97.4 F (36.3 C) (Oral)   Ht 6\' 2"  (1.88 m)   Wt 181 lb 9.6 oz (82.4 kg)   SpO2 96%   BMI 23.32 kg/m  Gen: NAD, resting comfortably HEENT: Mucous membranes are moist. Oropharynx normal CV: irregularly irregular no murmurs rubs or gallops Lungs: CTAB no crackles, wheeze, rhonchi Abdomen: soft/nontender/nondistended/normal bowel sounds. No rebound or guarding.  Ext: no edema Skin: warm, dry Neuro: resting tremor noted  Assessment/Plan:  Parkinson's disease ([place]) S:Dr. Tat. No rx as of 12/07/16.  Initial detection 02/06/16 with resting tremor He is really enjoying his boxing and cycling classes A/P: continue lifestyle modification and has upcoming follow up with Dr. Carles Collet to consider medication   Atrial flutter ([place]) S: Listed as A fib as well in other portion of chart-  Dr. Lovena Le seen since June, 2011. Rate control atenolol. Eliquis anticoagulation.  A/P: continue current medication  Essential hypertension S: controlled on Half of Atenolol 50mg , felodipine 10mg , half of hzyaar 100-12.5mg . .  BP Readings from Last 3 Encounters:  12/07/16 124/82  09/05/16 104/70  08/09/16 110/72  A/P:Continue current meds:  Well  controlled   Dyslipidemia S: well controlled on simvastatin 40mg . No myalgias.  Lab Results  Component Value Date   CHOL 120 02/06/2016   HDL 58.00 02/06/2016   LDLCALC 49 02/06/2016   TRIG 66.0 02/06/2016   CHOLHDL 2 02/06/2016   A/P: continue current rx  6 month follow up reasonable though we did not specifically discuss this today. Wrote on avs  Return precautions  advised.  Garret Reddish, MD

## 2016-12-07 NOTE — Assessment & Plan Note (Signed)
S: Listed as A fib as well in other portion of chart-  Dr. Lovena Le seen since June, 2011. Rate control atenolol. Eliquis anticoagulation.  A/P: continue current medication

## 2016-12-07 NOTE — Progress Notes (Signed)
Pre visit review using our clinic review tool, if applicable. No additional management support is needed unless otherwise documented below in the visit note. 

## 2016-12-07 NOTE — Assessment & Plan Note (Signed)
S: controlled on Half of Atenolol 50mg , felodipine 10mg , half of hzyaar 100-12.5mg . .  BP Readings from Last 3 Encounters:  12/07/16 124/82  09/05/16 104/70  08/09/16 110/72  A/P:Continue current meds:  Well controlled

## 2016-12-17 ENCOUNTER — Ambulatory Visit (INDEPENDENT_AMBULATORY_CARE_PROVIDER_SITE_OTHER): Payer: Medicare Other | Admitting: Family Medicine

## 2016-12-17 ENCOUNTER — Encounter: Payer: Self-pay | Admitting: Family Medicine

## 2016-12-17 ENCOUNTER — Telehealth: Payer: Self-pay | Admitting: Family Medicine

## 2016-12-17 ENCOUNTER — Other Ambulatory Visit: Payer: Self-pay

## 2016-12-17 VITALS — BP 100/74 | HR 78 | Temp 97.5°F | Wt 176.2 lb

## 2016-12-17 DIAGNOSIS — R05 Cough: Secondary | ICD-10-CM | POA: Diagnosis not present

## 2016-12-17 DIAGNOSIS — R059 Cough, unspecified: Secondary | ICD-10-CM

## 2016-12-17 DIAGNOSIS — J069 Acute upper respiratory infection, unspecified: Secondary | ICD-10-CM

## 2016-12-17 MED ORDER — BENZONATATE 100 MG PO CAPS: 100.0000 mg | ORAL_CAPSULE | Freq: Three times a day (TID) | ORAL | 0 refills | Status: DC

## 2016-12-17 NOTE — Patient Instructions (Signed)
Your symptoms are most likely related to a viral illness. Please drink plenty of water so that your urine is pale yellow or clear. Also, get plenty of rest, use Mucinex DM for cough or you can use benzonatate for cough that is not controlled with Mucinex DM. Follow up for further evaluation if  symptoms do not improve in 3 to 4 days, worsen, or you develop a fever >101.   Upper Respiratory Infection, Adult Most upper respiratory infections (URIs) are caused by a virus. A URI affects the nose, throat, and upper air passages. The most common type of URI is often called "the common cold." Follow these instructions at home:  Take medicines only as told by your doctor.  Gargle warm saltwater or take cough drops to comfort your throat as told by your doctor.  Use a warm mist humidifier or inhale steam from a shower to increase air moisture. This may make it easier to breathe.  Drink enough fluid to keep your pee (urine) clear or pale yellow.  Eat soups and other clear broths.  Have a healthy diet.  Rest as needed.  Go back to work when your fever is gone or your doctor says it is okay.  You may need to stay home longer to avoid giving your URI to others.  You can also wear a face mask and wash your hands often to prevent spread of the virus.  Use your inhaler more if you have asthma.  Do not use any tobacco products, including cigarettes, chewing tobacco, or electronic cigarettes. If you need help quitting, ask your doctor. Contact a doctor if:  You are getting worse, not better.  Your symptoms are not helped by medicine.  You have chills.  You are getting more short of breath.  You have brown or red mucus.  You have yellow or brown discharge from your nose.  You have pain in your face, especially when you bend forward.  You have a fever.  You have puffy (swollen) neck glands.  You have pain while swallowing.  You have white areas in the back of your throat. Get help  right away if:  You have very bad or constant:  Headache.  Ear pain.  Pain in your forehead, behind your eyes, and over your cheekbones (sinus pain).  Chest pain.  You have long-lasting (chronic) lung disease and any of the following:  Wheezing.  Long-lasting cough.  Coughing up blood.  A change in your usual mucus.  You have a stiff neck.  You have changes in your:  Vision.  Hearing.  Thinking.  Mood. This information is not intended to replace advice given to you by your health care provider. Make sure you discuss any questions you have with your health care provider. Document Released: 02/06/2008 Document Revised: 04/22/2016 Document Reviewed: 11/25/2013 Elsevier Interactive Patient Education  2017 [employer].

## 2016-12-17 NOTE — Telephone Encounter (Signed)
Scheduled to to see Delano Metz, NP this afternoon. Pt aware. Nothing further needed at this time.

## 2016-12-17 NOTE — Telephone Encounter (Signed)
Pt has head congestion, drainage and pt is coughing up yellow phelgm.  Pt feels like left lung is ful. Would like to know what the dr would recommend.

## 2016-12-17 NOTE — Progress Notes (Signed)
Patient ID: Carlos [name], male   DOB: 01/09/1941, 76 y.o.   MRN: [id number]  PCP: Garret Reddish, MD  Subjective:  Carlos [name] is a 76 y.o. year old very pleasant male patient who presents with Upper Respiratory infection symptoms including rhinitis with clear drainage, post nasal drip, productive cough with yellow/green sputum. Cough has not interrupted sleep. -started: 5 days ago, symptoms are not improving  -previous treatments: Allegra and Flonase have provided limited benefit.  -sick contacts/travel/risks: denies flu exposure. No recent antibiotic use or recent sick contact exposure. -Hx of: allergies  ROS-denies fever, SOB, NVD, tooth pain, ear pain  Pertinent Past Medical History-   Medications- reviewed  Current Outpatient Prescriptions  Medication Sig Dispense Refill  . amoxicillin (AMOXIL) 500 MG capsule 4 tabs by mouth 1 hr prior to dental procedures.    Marland Kitchen apixaban (ELIQUIS) 5 MG TABS tablet Take 1 tablet (5 mg total) by mouth 2 (two) times daily. 60 tablet 5  . atenolol (TENORMIN) 50 MG tablet Take 25 mg by mouth daily.    . felodipine (PLENDIL) 10 MG 24 hr tablet TAKE 1 TABLET BY MOUTH EVERY DAY 90 tablet 1  . felodipine (PLENDIL) 10 MG 24 hr tablet TAKE 1 TABLET BY MOUTH EVERY DAY 90 tablet 1  . fish oil-omega-3 fatty acids 1000 MG capsule Take 1 g by mouth daily.      Marland Kitchen losartan-hydrochlorothiazide (HYZAAR) 100-12.5 MG tablet Take 1 tablet by mouth daily. 30 tablet 5  . Multiple Vitamin (MULTIVITAMIN) tablet Take 1 tablet by mouth daily.      . simvastatin (ZOCOR) 40 MG tablet TAKE 1 TABLET BY MOUTH EVERY NIGHT AT BEDTIME 90 tablet 0   No current facility-administered medications for this visit.     Objective: BP 100/74 (BP Location: Left Arm, Patient Position: Sitting, Cuff Size: Normal)   Pulse 78   Temp 97.5 F (36.4 C) (Oral)   Wt 176 lb 3.2 oz (79.9 kg)   SpO2 97%   BMI 22.62 kg/m  Gen: NAD, resting comfortably HEENT: Turbinates erythematous, TM normal,  no erythema noted in pharynx with no tonsilar exudate or edema, no sinus tenderness; post nasal drip present CV: RRR no murmurs rubs or gallops Lungs: CTAB no crackles, wheeze, rhonchi Abdomen: soft/nontender/nondistended/normal bowel sounds. No rebound or guarding.  Ext: no edema Skin: warm, dry, no rash Neuro: grossly normal, moves all extremities  Assessment/Plan:  Upper Respiratory infection History and exam today are suggestive of viral infection most likely due to upper respiratory infection with presence of allergic rhinitis. Symptomatic treatment with: Allegra and Flonase and provided benzonatate for cough that is not controlled with OTC Mucinex DM if they decide to try this method. We reviewed concerns for OTC with diagnosis of parkinson's; patient diagnosis within the last year; currently no medication recommended by neurology at this time. Short term use of symptomatic medication can be used and if symptoms do not improve, worsen, or he develops new symptoms; medication will be stopped and he will follow up with PCP.  We discussed that we did not find any infection that had higher probability of being bacterial such as pneumonia or strep throat. We discussed signs that bacterial infection may have developed particularly fever or shortness of breath. Likely course of 1-2 weeks. Patient is contagious and advised good handwashing and consideration of mask If going to be in public places.   Finally, we reviewed reasons to return to care including if symptoms worsen or persist or new concerns  arise- once again particularly shortness of breath or fever.  Laurita Quint, FNP

## 2016-12-21 ENCOUNTER — Ambulatory Visit (INDEPENDENT_AMBULATORY_CARE_PROVIDER_SITE_OTHER): Payer: Medicare Other | Admitting: Family Medicine

## 2016-12-21 ENCOUNTER — Encounter: Payer: Self-pay | Admitting: Family Medicine

## 2016-12-21 VITALS — BP 100/70 | HR 70 | Temp 97.5°F | Ht 74.0 in | Wt 174.4 lb

## 2016-12-21 DIAGNOSIS — H73012 Bullous myringitis, left ear: Secondary | ICD-10-CM | POA: Diagnosis not present

## 2016-12-21 MED ORDER — AZITHROMYCIN 250 MG PO TABS: ORAL_TABLET | ORAL | 0 refills | Status: DC

## 2016-12-21 NOTE — Patient Instructions (Signed)
Take the antibiotic (azithromycin) as prescribed.  Have your doctor recheck your ear at follow up.  I hope you are feeling better soon! Seek care promptly if worsening, new concerns or you are not improving with treatment.

## 2016-12-21 NOTE — Progress Notes (Signed)
Pre visit review using our clinic review tool, if applicable. No additional management support is needed unless otherwise documented below in the visit note. 

## 2016-12-21 NOTE — Progress Notes (Signed)
HPI:  Acute visit for L ear pain: -dealing with a cold or allergies x4-5 days (nasal congestion, PND, cough) -last night developed L ear pain -no fevers, body aches, SOB, DOE, drainage from ear  ROS: See pertinent positives and negatives per HPI.  Past Medical History:  Diagnosis Date  . Allergic rhinitis   . Allergy   . Anxiety   . Aortic insufficiency    mild... echo... 02/2010  . Atrial flutter ([place]) 02/03/2010   Consult by Dr. Lovena Le... June, 2011.... plan rate control...  Coumadin not needed... ablation if rate not controlled well  . Basal cell carcinoma   . Chest pain    Nuclear, March, 2010, no ischemia  . Degenerative joint disease   . Diastolic dysfunction    EF 50-55%... echo.. 02/2010 (55-60% echo 2009)  . Dyslipidemia   . Ejection fraction    EF 50-55%, echo, June, 2011  . Hemorrhoids   . HEMORRHOIDS 10/21/2007   Qualifier: History of  By: [name], [name]   . HTN (hypertension)   . Incomplete RBBB    IRBBB; sinus bradycardia w/ PVCs  . LVH (left ventricular hypertrophy)    moderate... echo... 02/2010  . Mitral regurgitation    mild... echo.. 02/2010  . PVC (premature ventricular contraction)   . Renal calculus   . Sinus bradycardia     Past Surgical History:  Procedure Laterality Date  . COLONOSCOPY    . KIDNEY STONE SURGERY    . POLYPECTOMY      Family History  Problem Relation Age of Onset  . Hypertension Mother   . Stroke Mother 76    lived to 1  . Other Father     parkinsons ? 84  . Colon cancer Paternal Aunt   . Colon cancer Paternal Uncle     Social History   Social History  . Marital status: Married    Spouse name: March Steyer  . Number of children: 2  . Years of education: N/A   Occupational History  . retired     Risk analyst   Social History Main Topics  . Smoking status: Never Smoker  . Smokeless tobacco: Never Used  . Alcohol use 0.0 - 0.6 oz/week     Comment: etoh; glass of wine one time a week   . Drug use: No   . Sexual activity: Not Asked   Other Topics Concern  . None   Social History Narrative   Married- wife patient of Dr. [name]. Son and daughter in 72s in 2018. 82 34 year old grandchild.  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 Active with parkinsons exercise class- boxing and cycling     Current Outpatient Prescriptions:  .  amoxicillin (AMOXIL) 500 MG capsule, 4 tabs by mouth 1 hr prior to dental procedures., Disp: , Rfl:  .  apixaban (ELIQUIS) 5 MG TABS tablet, Take 1 tablet (5 mg total) by mouth 2 (two) times daily., Disp: 60 tablet, Rfl: 5 .  atenolol (TENORMIN) 50 MG tablet, Take 25 mg by mouth daily., Disp: , Rfl:  .  benzonatate (TESSALON) 100 MG capsule, Take 1 capsule (100 mg total) by mouth 3 (three) times daily., Disp: 20 capsule, Rfl: 0 .  felodipine (PLENDIL) 10 MG 24 hr tablet, TAKE 1 TABLET BY MOUTH EVERY DAY, Disp: 90 tablet, Rfl: 1 .  felodipine (PLENDIL) 10 MG 24 hr tablet, TAKE 1 TABLET BY MOUTH EVERY DAY, Disp: 90 tablet, Rfl: 1 .  fish oil-omega-3 fatty acids 1000 MG capsule, Take 1 g by mouth daily.  , Disp: , Rfl:  .  losartan-hydrochlorothiazide (HYZAAR) 100-12.5 MG tablet, Take 1 tablet by mouth daily., Disp: 30 tablet, Rfl: 5 .  Multiple Vitamin (MULTIVITAMIN) tablet, Take 1 tablet by mouth daily.  , Disp: , Rfl:  .  simvastatin (ZOCOR) 40 MG tablet, TAKE 1 TABLET BY MOUTH EVERY NIGHT AT BEDTIME, Disp: 90 tablet, Rfl: 0 .  azithromycin (ZITHROMAX) 250 MG tablet, 2 tabs day 1, then one tab daily, Disp: 6 tablet, Rfl: 0  EXAM:  Vitals:   12/21/16 1056  BP: 100/70  Pulse: 70  Temp: 97.5 F (36.4 C)    Body mass index is 22.39 kg/m.  GENERAL: vitals reviewed and listed above, alert, oriented, appears well hydrated and in no acute distress  HEENT: atraumatic, conjunttiva clear, no obvious abnormalities on inspection of  external nose and ears, normal appearance of ear canals and TMs except for red, bulging L TM with bullous, clear nasal congestion, mild post oropharyngeal erythema with PND, no tonsillar edema or exudate, no sinus TTP  NECK: no obvious masses on inspection  LUNGS: clear to auscultation bilaterally, no wheezes, rales or rhonchi, good air movement  CV: HRRR, no peripheral edema  MS: moves all extremities without noticeable abnormality  PSYCH: pleasant and cooperative, no obvious depression or anxiety  ASSESSMENT AND PLAN:  Discussed the following assessment and plan:  Bullous myringitis of left ear  -we discussed possible serious and likely etiologies, treatment options, treatment risks and return precautions -after this discussion, Juwan opted for azithromycin (reports has tolerated well in the past) -follow up advised as needed and as scheduled with PCP for recheck if doing well -of course, we advised Miking  to return sooner if symptoms worsen, persist or new concerns arise.  Patient Instructions  Take the antibiotic (azithromycin) as prescribed.  Have your doctor recheck your ear at follow up.  I hope you are feeling better soon! Seek care promptly if worsening, new concerns or you are not improving with treatment.         Colin Benton R., DO

## 2017-01-07 NOTE — Progress Notes (Signed)
Carlos [name] was seen today in the movement disorders clinic for neurologic consultation at the request of [name], MD.  This patient is accompanied in the office by his spouse who supplements the history. The consultation is for the evaluation of tremor.  Pt states that he noted stiffness in the arm about a year ago and then 6 months ago he noted tremor in the right arm (tremor noted in med records in 07/2015).  Wife notes tremor in the R arm when driving or when stressed in traffic.  She will note in when the hand is hanging loose or if hanging across her in bed.   08/09/16 update:  The patient follows up today, accompanied by his wife and son who supplement the history.  The patient has a new diagnosis of Parkinson's disease.  He got clearance from his cardiologist and pulmonologist to participate in Parkinson's related exercises.  The patient is now participating in rock steady boxing in [place] and loves it.  He goes 3 days a week and goes to [facility] cycle class one day a week.  His family has noticed an improvement in posture and ability to ambulate.  The patient feels great.  01/08/17 update:  Pt seen today in f/u, accompanied by his wife and daughter who supplement the history.  By his choice, he is on no PD meds.  He is exercising faithfully (boxing 3 days per week, [employer] cycle class).  He is doing well.  Pt denies falls.  Pt denies lightheadedness, near syncope.  No hallucinations.  Mood has been good.  Wife indicates more trouble tying his ties.  Also more trouble with handwriting.  Some concerns with memory by family.    ALLERGIES:  No Known Allergies  CURRENT MEDICATIONS:  Outpatient Encounter Prescriptions as of 01/08/2017  Medication Sig  . amoxicillin (AMOXIL) 500 MG capsule 4 tabs by mouth 1 hr prior to dental procedures.  Marland Kitchen apixaban (ELIQUIS) 5 MG TABS tablet Take 1 tablet (5 mg total) by mouth 2 (two) times daily.  Marland Kitchen atenolol (TENORMIN) 50 MG tablet Take 25 mg by mouth  daily.  . felodipine (PLENDIL) 10 MG 24 hr tablet TAKE 1 TABLET BY MOUTH EVERY DAY  . losartan-hydrochlorothiazide (HYZAAR) 100-12.5 MG tablet Take 1 tablet by mouth daily.  . Multiple Vitamin (MULTIVITAMIN) tablet Take 1 tablet by mouth daily.    . simvastatin (ZOCOR) 40 MG tablet TAKE 1 TABLET BY MOUTH EVERY NIGHT AT BEDTIME  . [DISCONTINUED] azithromycin (ZITHROMAX) 250 MG tablet 2 tabs day 1, then one tab daily  . [DISCONTINUED] benzonatate (TESSALON) 100 MG capsule Take 1 capsule (100 mg total) by mouth 3 (three) times daily.  . [DISCONTINUED] felodipine (PLENDIL) 10 MG 24 hr tablet TAKE 1 TABLET BY MOUTH EVERY DAY  . [DISCONTINUED] fish oil-omega-3 fatty acids 1000 MG capsule Take 1 g by mouth daily.     No facility-administered encounter medications on file as of 01/08/2017.     PAST MEDICAL HISTORY:   Past Medical History:  Diagnosis Date  . Allergic rhinitis   . Allergy   . Anxiety   . Aortic insufficiency    mild... echo... 02/2010  . Atrial flutter ([place]) 02/03/2010   Consult by Dr. Lovena Le... June, 2011.... plan rate control...  Coumadin not needed... ablation if rate not controlled well  . Basal cell carcinoma   . Chest pain    Nuclear, March, 2010, no ischemia  . Degenerative joint disease   . Diastolic dysfunction  EF 50-55%... echo.. 02/2010 (55-60% echo 2009)  . Dyslipidemia   . Ejection fraction    EF 50-55%, echo, June, 2011  . Hemorrhoids   . HEMORRHOIDS 10/21/2007   Qualifier: History of  By: [name], [name]   . HTN (hypertension)   . Incomplete RBBB    IRBBB; sinus bradycardia w/ PVCs  . LVH (left ventricular hypertrophy)    moderate... echo... 02/2010  . Mitral regurgitation    mild... echo.. 02/2010  . PVC (premature ventricular contraction)   . Renal calculus   . Sinus bradycardia     PAST SURGICAL HISTORY:   Past Surgical History:  Procedure Laterality Date  . COLONOSCOPY    . KIDNEY STONE SURGERY    . POLYPECTOMY      SOCIAL HISTORY:     Social History   Social History  . Marital status: Married    Spouse name: Thimothy Barretta  . Number of children: 2  . Years of education: N/A   Occupational History  . retired     Risk analyst   Social History Main Topics  . Smoking status: Never Smoker  . Smokeless tobacco: Never Used  . Alcohol use 0.0 - 0.6 oz/week     Comment: etoh; glass of wine one time a week   . Drug use: No  . Sexual activity: Not on file   Other Topics Concern  . Not on file   Social History Narrative   Married- wife patient of Dr. [name]. Son and daughter in 55s in 2018. 15 67 year old grandchild.  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 Active with parkinsons exercise class- boxing and cycling    FAMILY HISTORY:   Family Status  Relation Status  . Mother Deceased   stroke  . Father Deceased at age 66   "old age"  . Sister Alive   healthy  . Sister Alive   healthy  . Child Alive   2, healthy  . Paternal Aunt   . Paternal Uncle     ROS:  A complete 10 system review of systems was obtained and was unremarkable apart from what is mentioned above.  PHYSICAL EXAMINATION:    VITALS:   Vitals:   01/08/17 1254  BP: 112/60  Pulse: 64  Weight: 174 lb (78.9 kg)  Height: 6\' 1"  (1.854 m)    GEN:  The patient appears stated age and is in NAD. HEENT:  Normocephalic, atraumatic.  The mucous membranes are moist. The superficial temporal arteries are without ropiness or tenderness. CV:  irreg Lungs:  CTAB Neck/HEME:  There are no carotid bruits bilaterally.  Neurological examination:  Orientation: The patient is alert and oriented x3.  Cranial nerves: There is good facial symmetry.  The speech is fluent and clear. Soft palate rises symmetrically and there is no tongue deviation. Hearing is intact to conversational tone. Sensation: Sensation is intact to  light touch throughout Motor: Strength is 5/5 in the bilateral upper and lower extremities.   Shoulder shrug is equal and symmetric.  There is no pronator drift.  Movement examination: Tone: There is normal tone in the UE/LE Abnormal movements: There is intermittent mod RUE resting tremor Coordination:  There is minimal trouble with alternation of supination/pronation of the forearm on the right and mild trouble with heel taps on the right Gait  and Station: The patient has no difficulty arising out of a deep-seated chair without the use of the hands. The patient's stride length is normal with decreased arm swing on the right.  Able to run down the hall.  Negative pull test.    LABS    Chemistry      Component Value Date/Time   NA 141 02/06/2016 1223   K 3.7 02/06/2016 1223   CL 101 02/06/2016 1223   CO2 32 02/06/2016 1223   BUN 16 02/06/2016 1223   CREATININE 0.85 02/06/2016 1223      Component Value Date/Time   CALCIUM 10.0 02/06/2016 1223   ALKPHOS 67 02/06/2016 1223   AST 22 02/06/2016 1223   ALT 23 02/06/2016 1223   BILITOT 1.3 (H) 02/06/2016 1223     Lab Results  Component Value Date   WBC 7.4 02/06/2016   HGB 16.7 02/06/2016   HCT 49.7 02/06/2016   MCV 89.0 02/06/2016   PLT 164.0 02/06/2016   Lab Results  Component Value Date   TSH 1.12 02/06/2016   No results found for: VITAMINB12   ASSESSMENT/PLAN:  1.  Idiopathic Parkinson's disease.  He has Hoehn and Yoehr stage 1 disease.  -Congratulated the patient on his exercise, including rock steady boxing.  -talked about prognosis/pathophys/future expectations in detail as patients son not present at previous visit  -talked extensively again about meds.  Doesn't want medication even though meds may improve quality of life.  2.  Memory loss  -will schedule neurocognitive testing  -talked about learning new things and keeping a schedule  3.  HTN  -on lopressor.  May be on this for aflutter/afib.  Will need to  closely watch his BP as it is already very low but he is asymptommatic.    4.  Follow up is anticipated in the next few months, sooner should new neurologic issues arise.  Much greater than 50% of this visit was spent in counseling and coordinating care.  Total face to face time:  35 min

## 2017-01-08 ENCOUNTER — Encounter: Payer: Self-pay | Admitting: Neurology

## 2017-01-08 ENCOUNTER — Ambulatory Visit (INDEPENDENT_AMBULATORY_CARE_PROVIDER_SITE_OTHER): Payer: Medicare Other | Admitting: Neurology

## 2017-01-08 VITALS — BP 112/60 | HR 64 | Ht 73.0 in | Wt 174.0 lb

## 2017-01-08 DIAGNOSIS — R413 Other amnesia: Secondary | ICD-10-CM

## 2017-01-08 DIAGNOSIS — G20 Parkinson's disease: Secondary | ICD-10-CM | POA: Diagnosis not present

## 2017-01-08 NOTE — Patient Instructions (Signed)
Patients age 77+:  You have been referred for a neurocognitive evaluation in our office.   The evaluation takes approximately two hours. The first part of the appointment is a clinical interview with the neuropsychologist (Dr. Macarthur Critchley). Please bring someone with you to this appointment if possible, as it is helpful for Dr. [name] to hear from both you and another adult who knows you well. After speaking with Dr. [name], you will complete testing with her technician. The testing includes a variety of tasks- mostly question-and-answer, some paper-and-pencil. There is nothing you need to do to prepare for this appointment, but having a good night's sleep prior to the testing, and bringing eyeglasses and hearing aids (if you wear them), is advised.   About a week after the evaluation, you will return to follow up with Dr. [name] to review the test results. This appointment is about 30 minutes. If you would like a family member to receive this information as well, please bring them to the appointment.   We have to reserve several hours of the neuropsychologist's time and the psychometrician's time for your evaluation appointment. As such, please note that there is a No-Show fee of $100. If you are unable to attend any of your appointments, please contact our office as soon as possible to reschedule.    Patients age 6 and under:  You have been referred for a neurocognitive evaluation in our office.   The evaluation consists of three appointments.   1. The first appointment is about 45 minutes and is a clinical interview with the neuropsychologist (Dr. Macarthur Critchley). You can bring someone with you to this appointment, as it is helpful for Dr. [name] to hear from both you and another adult who knows you well.   2. The second appointment is 2-3 hours long and is with the psychometrician Milana Kidney). You will complete a variety of tasks- mostly question-and-answer, some paper-and-pencil,  some on the computer. There is nothing you need to do to prepare for this appointment, but having a good night's sleep prior to the testing, and bringing eyeglasses and hearing aids (if you wear them), is advised.   3. The final appointment is a follow-up with Dr. [name] where she will go over the test results with you and provide recommendations. This appointment is about 30 minutes.  If you would like a family member to receive this information as well, please bring them to the appointment.   We have to reserve several hours of the neuropsychologist's time and the psychometrician's time for your appointment. As such, please note that there is a No-Show fee of $100. If you are unable to attend any of your appointments, please contact our office as soon as possible to reschedule.

## 2017-01-10 ENCOUNTER — Ambulatory Visit (INDEPENDENT_AMBULATORY_CARE_PROVIDER_SITE_OTHER): Payer: Medicare Other | Admitting: Psychology

## 2017-01-10 ENCOUNTER — Encounter: Payer: Self-pay | Admitting: Psychology

## 2017-01-10 DIAGNOSIS — G20 Parkinson's disease: Secondary | ICD-10-CM | POA: Diagnosis not present

## 2017-01-10 DIAGNOSIS — R413 Other amnesia: Secondary | ICD-10-CM | POA: Diagnosis not present

## 2017-01-10 NOTE — Progress Notes (Signed)
   Neuropsychology Note  Carlos [name] came in today for 1 hour of neuropsychological testing with technician, Carlos [name], BS, under the supervision of Dr. Macarthur Critchley. The patient did not appear overtly distressed by the testing session, per behavioral observation or via self-report to the technician. Rest breaks were offered. Carlos [name] will return within 2 weeks for a feedback session with Dr. [name] at which time his test performances, clinical impressions and treatment recommendations will be reviewed in detail. The patient understands he can contact our office should he require our assistance before this time.  Full report to follow.

## 2017-01-10 NOTE — Progress Notes (Signed)
NEUROPSYCHOLOGICAL INTERVIEW (CPT: [id number])  Name: Carlos [name] Date of Birth: Jun 02, 1941 Date of Interview: 01/10/2017  Reason for Referral:  Carlos [name] is a 76 y.o. male who is referred for neuropsychological evaluation by Dr. Wells Guiles Tat of [facility] due to concerns about memory decline. This patient is accompanied in the office by his wife, Carlos [name], who supplements the history.  History of Presenting Problem:  Carlos [name] was diagnosed with probable Parkinson's disease by Dr. Carles Collet in July 2017. The patient endorsed gradual, mild cognitive decline over the past year or so. His wife noticed some subtle cognitive changes over the past few years. Cognitive symptoms do not interfere with daily functioning, but the patient is very motivated to learn any strategies or exercises he can use to enhance cognitive functioning and prevent/delay decline. He has an excellent attitude about his PD diagnosis, viewing it as a challenge to fight and overcome. He has been very proactive with physical exercise. He exercises daily, either walking 2-3 miles, cycling class for PD, or [place] boxing class for PD. He and his wife have observed significant improvement in posture and ambulation since starting the boxing program. He has also formed some very close bonds/friendships with fellow participants in the program. He does not take any medications for PD at this point.  Per the patient and his wife, current cognitive symptoms include: mild forgetfulness for recent conversations, more difficulty retrieving names, slowed information processing speed, and less certainty about directions when driving in less familiar areas (eg [place]). He denies every getting lost, word finding difficulty, concentration problems, or misplacing/losing items. He denies forgetting to take medications. His wife has always managed the finances, cooking and appointment calendar. He helps with housework such as vacuuming and washing  dishes. He loves to do yard work including mowing lawns.  His mood is generally upbeat. He denies any past or present depression. His anxiety level has increased somewhat in recent months/years. He is very anxious about getting to appointments punctually, but this is a longstanding issue. His wife notes that he is somewhat more agitated lately, and speaks "a little shorter" than he used to. If he gets anxious about something, it shows much more than it used to. His wife also feels he has become "a little more clingy" and needs a little more reassurance. Otherwise there have been no changes in personality or behavior. He has not had any hallucinations or visual illusions. He has not had any falls or head injuries. He is sleeping well. He averages at least 8 hours a night. He has more of a tendency to fall asleep during the day if he is not actively doing something. He does not have any problems with fatigue or energy. He does not act out dreams or talk in his sleep. His wife has noticed a little bit of movement in his legs in his sleep but nothing troublesome. His appetite is good and his wife cooks healthy meals.  Psychiatric history was denied.  There is no known family history of dementia or Parkinson's disease.  The patient is very interested in learning what he can do for brain health / cognitive functioning. His family would like to find activities he can do with his hands/mind when he is at rest, since that is when he has a tendency to doze off.    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 Worked for [employer] for 8-9 years. Did deliveries for a florist for 1-2 years. Stopped working when diagnosed with PD last year. Marital history:  married 63 years. 2 children, 3 grandchildren.  Alcohol/Tobacco/Substances: Very rare alcohol (wine). Never a smoker. No SA.   Medical History: Past Medical History:  Diagnosis Date  . Allergic rhinitis   . Allergy   . Anxiety   . Aortic insufficiency    mild... echo... 02/2010  . Atrial flutter ([place]) 02/03/2010   Consult by Dr. Lovena Le... June, 2011.... plan rate control...  Coumadin not needed... ablation if rate not controlled well  . Basal cell carcinoma   . Chest pain    Nuclear, March, 2010, no ischemia  . Degenerative joint disease   . Diastolic dysfunction    EF 50-55%... echo.. 02/2010 (55-60% echo 2009)  . Dyslipidemia   . Ejection fraction    EF 50-55%, echo, June, 2011  . Hemorrhoids   . HEMORRHOIDS 10/21/2007   Qualifier: History of  By: [name], [name]   . HTN (hypertension)   . Incomplete RBBB    IRBBB; sinus bradycardia w/ PVCs  . LVH (left ventricular hypertrophy)    moderate... echo... 02/2010  . Mitral regurgitation    mild... echo.. 02/2010  . PVC (premature ventricular contraction)   . Renal calculus   . Sinus bradycardia    His wife notes he has been diagnosed with hypertension since 1968. She also notes he was diagnosed with AFib about 4 years ago, on Eliquis for this   Current Medications:  Outpatient Encounter Prescriptions as of 01/10/2017  Medication Sig  . amoxicillin (AMOXIL) 500 MG capsule 4 tabs by mouth 1 hr prior to dental procedures.  Marland Kitchen apixaban (ELIQUIS) 5 MG TABS tablet Take 1 tablet (5 mg total) by mouth 2 (two) times daily.  Marland Kitchen atenolol (TENORMIN) 50 MG tablet Take 25 mg by mouth daily.  . felodipine (PLENDIL) 10 MG 24 hr tablet TAKE 1 TABLET BY MOUTH EVERY DAY  . losartan-hydrochlorothiazide (HYZAAR) 100-12.5 MG tablet Take 1 tablet by mouth daily.  . Multiple Vitamin (MULTIVITAMIN) tablet Take 1 tablet by mouth daily.    . simvastatin (ZOCOR) 40 MG tablet TAKE 1 TABLET BY MOUTH EVERY NIGHT AT BEDTIME   No facility-administered  encounter medications on file as of 01/10/2017.      Behavioral Observations:   Appearance: Neatly and appropriately dressed and groomed. Gait: Ambulated independently, no gross abnormalities observed Speech: Fluent; normal rate, rhythm and volume. No word finding difficulty. Thought process: Linear, goal directed Affect: Full, bright, euthymic Interpersonal: Very pleasant, appropriate   TESTING: There is medical necessity to proceed with neuropsychological assessment as the results will be used to aid in differential diagnosis and clinical decision-making and to inform specific treatment recommendations. Per the patient, his wife and medical records reviewed, there has been a change in cognitive functioning and a reasonable suspicion of Parkinson's related MCI. Following the clinical interview, the patient completed a full battery of neuropsychological testing with my psychometrician under my supervision.   PLAN: Mr. Cory and his wife and children will see me for a follow-up session at which time his test performances and my impressions and treatment recommendations will be reviewed in detail.   Full neuropsychological evaluation report to follow.

## 2017-01-15 ENCOUNTER — Ambulatory Visit: Payer: Medicare Other | Admitting: Family Medicine

## 2017-01-16 NOTE — Progress Notes (Signed)
NEUROPSYCHOLOGICAL EVALUATION   Name:    Carlos [name]  Date of Birth:   1941/01/17 Date of Interview:  01/10/2017 Date of Testing:  01/10/2017   Date of Feedback:  01/17/2017       Background Information:  Reason for Referral:  Carlos [name] is a 76 y.o. male referred by Dr. Wells Guiles Tat to assess his current level of cognitive functioning and assist in differential diagnosis. The current evaluation consisted of a review of available medical records, an interview with the patient and wife, Carlos [name], and the completion of a neuropsychological testing battery. Informed consent was obtained.  History of Presenting Problem:  Carlos [name] was diagnosed with probable Parkinson's disease by Dr. Carles Collet in July 2017. The patient endorsed gradual, mild cognitive decline over the past year or so. His wife noticed some subtle cognitive changes over the past few years. Cognitive symptoms do not interfere with daily functioning, but the patient is very motivated to learn any strategies or exercises he can use to enhance cognitive functioning and prevent/delay decline. He has an excellent attitude about his PD diagnosis, viewing it as a challenge to fight and overcome. He has been very proactive with physical exercise. He exercises daily, either walking 2-3 miles, cycling class for PD, or [place] boxing class for PD. He and his wife have observed significant improvement in posture and ambulation since starting the boxing program. He has also formed some very close bonds/friendships with fellow participants in the program. He does not take any medications for PD at this point.  Per the patient and his wife, current cognitive symptoms include: mild forgetfulness for recent conversations, more difficulty retrieving names, slowed information processing speed, and less certainty about directions when driving in less familiar areas (eg [place]). He denies every getting lost, word finding difficulty, concentration  problems, or misplacing/losing items. He denies forgetting to take medications. His wife has always managed the finances, cooking and appointment calendar. He helps with housework such as vacuuming and washing dishes. He loves to do yard work including mowing lawns.  His mood is generally upbeat. He denies any past or present depression. His anxiety level has increased somewhat in recent months/years. He is very anxious about getting to appointments punctually, but this is a longstanding issue. His wife notes that he is somewhat more agitated lately, and speaks "a little shorter" than he used to. If he gets anxious about something, it shows much more than it used to. His wife also feels he has become "a little more clingy" and needs a little more reassurance. Otherwise there have been no changes in personality or behavior. He has not had any hallucinations or visual illusions. He has not had any falls or head injuries. He is sleeping well. He averages at least 8 hours a night. He has more of a tendency to fall asleep during the day if he is not actively doing something. He does not have any problems with fatigue or energy. He does not act out dreams or talk in his sleep. His wife has noticed a little bit of movement in his legs in his sleep but nothing troublesome. His appetite is good and his wife cooks healthy meals.  Psychiatric history was denied.  There is no known family history of dementia or Parkinson's disease.  The patient is very interested in learning what he can do for brain health / cognitive functioning. His family would like to find activities he can do with his hands/mind when he  is at rest, since that is when he has a tendency to doze off.    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 Retired from business at age 61-66. Worked for [employer] for 8-9 years. Did deliveries for a florist for 1-2 years. Stopped working when diagnosed with PD last year. Marital history: married 45 years. 2 children, 3 grandchildren.  Alcohol/Tobacco/Substances: Very rare alcohol (wine). Never a smoker. No SA.    Medical History:  Past Medical History:  Diagnosis Date  . Allergic rhinitis   . Allergy   . Anxiety   . Aortic insufficiency    mild... echo... 02/2010  . Atrial flutter ([place]) 02/03/2010   Consult by Dr. Lovena Le... June, 2011.... plan rate control...  Coumadin not needed... ablation if rate not controlled well  . Basal cell carcinoma   . Chest pain    Nuclear, March, 2010, no ischemia  . Degenerative joint disease   . Diastolic dysfunction    EF 50-55%... echo.. 02/2010 (55-60% echo 2009)  . Dyslipidemia   . Ejection fraction    EF 50-55%, echo, June, 2011  . Hemorrhoids   . HEMORRHOIDS 10/21/2007   Qualifier: History of  By: [name], [name]   . HTN (hypertension)   . Incomplete RBBB    IRBBB; sinus bradycardia w/ PVCs  . LVH (left ventricular hypertrophy)    moderate... echo... 02/2010  . Mitral regurgitation    mild... echo.. 02/2010  . PVC (premature ventricular contraction)   . Renal calculus   . Sinus bradycardia     Current medications:  Outpatient Encounter Prescriptions as of 01/17/2017  Medication Sig  . amoxicillin (AMOXIL) 500 MG capsule 4 tabs by mouth 1 hr prior to dental procedures.  Marland Kitchen apixaban (ELIQUIS) 5 MG TABS tablet Take 1 tablet (5 mg total) by mouth 2 (two) times daily.  Marland Kitchen atenolol (TENORMIN) 50 MG tablet Take 25 mg by mouth daily.  . felodipine (PLENDIL) 10 MG 24 hr tablet TAKE 1 TABLET BY MOUTH EVERY DAY  . losartan-hydrochlorothiazide (HYZAAR) 100-12.5 MG tablet Take 1 tablet by mouth daily.  . Multiple Vitamin (MULTIVITAMIN) tablet Take 1 tablet by mouth daily.    . simvastatin (ZOCOR) 40 MG tablet TAKE 1 TABLET  BY MOUTH EVERY NIGHT AT BEDTIME   No facility-administered encounter medications on file as of 01/17/2017.      Current Examination:  Behavioral Observations:   Appearance: Neatly and appropriately dressed and groomed. Gait: Ambulated independently, no gross abnormalities observed Speech: Fluent; normal rate, rhythm and volume. No word finding difficulty. Thought process: Linear, goal directed Affect: Full, bright, euthymic Interpersonal: Very pleasant, appropriate Orientation: Oriented to person, place and most aspects of time (off on the date by one day). Accurately named the current President and his predecessor.  Tests Administered: . Test of Premorbid Functioning (TOPF) . Wechsler Adult Intelligence Scale-Fourth Edition (WAIS-IV): Similarities, Block Design,  and Digit Span subtests . Wechsler Memory Scale-Fourth Edition (WMS-IV) Older Adult Version (ages 25-90): Logical Memory I, II and Recognition subtests  . [employer] Verbal Learning Test - 2nd Edition (CVLT-2) Short Form . Repeatable Battery for the Assessment of Neuropsychological Status (RBANS) Form A:  Figure Copy and Recall subtests and Semantic Fluency subtest . Neuropsychological Assessment Battery (NAB) Language Module, Form 1: Naming subtest . [employer] Diagnostic  Aphasia Examination: Complex Ideational Material subtest . Controlled Oral Word Association Test (COWAT) . Trail Making Test A and B . Clock drawing test . Symbol Digit Modalities Test (SDMT) . Geriatric Depression Scale (GDS) 15 Item . Generalized Anxiety Disorder - 7 item screener (GAD-7) . Parkinson's Disease Questionnaire (PDQ-39)  Test Results: Note: Standardized scores are presented only for use by appropriately trained professionals and to allow for any future test-retest comparison. These scores should not be interpreted without consideration of all the information that is contained in the rest of the report. The most recent standardization samples  from the test publisher or other sources were used whenever possible to derive standard scores; scores were corrected for age, gender, ethnicity and education when available.   Test Scores:  Test Name Raw Score Standardized Score Descriptor  TOPF 30/70 SS= 92 Average  WAIS-IV Subtests     Similarities 17/36 ss= 8 Low end of average  Block Design 28/66 ss= 10 Average  Digit Span Forward 9/16 ss= 9 Average  Digit Span Backward 4/16 ss= 5 Borderline  WMS-IV Subtests     LM I 19/53 ss= 6 Low average  LM II 11/39 ss= 8 Low end of average  LM II Recognition 16/23 Cum %: 26-50   RBANS Subtests     Figure Copy 16/20 Z= -1 Low average  Figure Recall 10/20 Z= -0.6 Average  Semantic Fluency 14/40 Z= -1.1 Low average  CVLT-II Scores     Trial 1 2/9 Z= -3 Severely impaired  Trial 4 6/9 Z= -1 Low average  Trials 1-4 total 15/36 T= 27 Impaired  SD Free Recall 4/9 Z= -1.5 Borderline  LD Free Recall 4/9 Z= -0.5 Average  LD Cued Recall 4/9 Z= -0.5 Average  Recognition Discriminability 7/9 hits, 4 false positives Z= -1 Low average  Forced Choice Recognition 9/9  WNL  NAB Language subtests     Naming 26/31 T= 29 Impaired  BDAE Subtest     Complex Ideational Material 10/12    COWAT-FAS 29 T= 39 Low average  COWAT-Animals 17 T= 47 Average  Trail Making Test A  44" 0 errors T= 52 Average  Trail Making Test B  168" 0 errors T= 40 Low average  Clock Drawing   Impaired  SDMT     Written 23 Z= -1.8 Borderline  Oral 36 Z= -1.2 Low average  GDS-15 0/15  WNL  GAD-7 6/21  Mild  PDQ-39     Mobility  0%   Activities of Daily Living  4.16%   Emotional Well Being  8.33%   Stigma  0%   Social Support  0%   Cognitive Impairment  12.5%   Communication  8.33%   Bodily Discomfort  8.33%       Description of Test Results:  Premorbid verbal intellectual abilities were estimated to have been within the average range based on a test of word reading. Psychomotor processing speed was borderline  impaired. When the motor component was removed, he performed in the low average range. Basic auditory attention was average, while more complex auditory attention (ie, working memory) was borderline impaired. Visual-spatial construction was average to low average. Language abilities were somewhat variable. Specifically, confrontation naming was impaired, while semantic verbal fluency was low average to average. Auditory comprehension of complex ideational material was mildly below expectation. With regard to verbal memory, encoding and acquisition of non-contextual information (i.e., word list) was impaired across four learning trials but he did demonstrate a positive learning curve. After  a brief distracter task, free recall was borderline impaired (4/9 items recalled). After a delay, free recall was average (4/9 items recalled) with good retention. He did not benefit from semantic cueing. Performance on a yes/no recognition task was low average overall. On another verbal memory test, encoding and acquisition of contextual auditory information (i.e., short stories) was low average. After a delay, free recall was average. Performance on a yes/no recognition task was within normal limits. With regard to non-verbal memory, delayed free recall of visual information was average. Executive functioning was mostly within normal limits. Mental flexibility and set-shifting were low average on Trails B. Verbal fluency with phonemic search restrictions was low average. Verbal abstract reasoning was average. Performance on a clock drawing task was impaired due to incorrect time placement. On a self-report measure of mood, the patient's responses were not  indicative of clinically significant depression at the present time. On a self-report measure of anxiety, he did endorse mild generalized anxiety characterized by mild nervousness, inability to control worrying, excessive worrying, trouble relaxing, irritability and fear of  something bad happening. On a self-report measure assessing the impact of PD on daily functioning and quality of life, he denied any significant difficulties with ADLs, emotional well being, stigma, social support, communication or bodily discomfort. He did endorse mild subjective cognitive difficulties.   Clinical Impressions: Mild cognitive impairment, most likely secondary to Parkinson's disease. Results of the current cognitive evaluation are largely within normal limits, with most areas of function consistent with estimated premorbid intellectual abilities. However, there are a few areas of mild impairment suggestive of mild fronto-subcortical dysfunction (e.g., difficulty encoding non-contextual verbal information; mildly impaired confrontation naming, impaired clock drawing). His current level of functioning and his testing results do not warrant a diagnosis of dementia; however, a diagnosis mild cognitive impairment is appropriate at this time. The mild cognitive changes he is experiencing common in Parkinson's disease. There is no evidence at this time of another neurodegenerative process. There also is no evidence of a primary psychiatric disorder. He is experiencing some mild, subclinical anxiety which is likely related to PD.   Recommendations: Based on the findings of the present evaluation, the following recommendations are offered:  --Continue regular cardiovascular exercise, mental stimulation and social interaction in order to maintain brain health. The patient and his family are interested to know more about mental stimulation exercises. I would recommend [email or web address].   --Neuropsychological re-assessment in one to two years is recommended in order to monitor cognitive status, track any progression of symptoms and further assist with treatment planning.  --Relaxation strategies such as deep breathing, visualization and progressive muscle relaxation may assist with management of mild  anxiety/irritability. If anxiety increases over time, medication and/or psychotherapy may be useful.   Feedback to Patient: Carlos [name], his wife and his two adult children returned for a feedback appointment on 01/17/2017 to review the results of his neuropsychological evaluation with this provider. 45 minutes face-to-face time was spent reviewing his test results, my impressions and my recommendations as detailed above.    Total time spent on this patient's case: [id number] unit for interview with psychologist; [phone] units of testing by psychometrician under psychologist's supervision; [phone] units for medical record review, scoring of neuropsychological tests, interpretation of test results, preparation of this report, and review of results to the patient by psychologist.      Thank you for your referral of [facility]. Please feel free to contact me if you have any questions or concerns  regarding this report.

## 2017-01-17 ENCOUNTER — Encounter: Payer: Self-pay | Admitting: Psychology

## 2017-01-17 ENCOUNTER — Ambulatory Visit (INDEPENDENT_AMBULATORY_CARE_PROVIDER_SITE_OTHER): Payer: Medicare Other | Admitting: Psychology

## 2017-01-17 DIAGNOSIS — G3184 Mild cognitive impairment, so stated: Secondary | ICD-10-CM

## 2017-01-17 DIAGNOSIS — G20 Parkinson's disease: Secondary | ICD-10-CM | POA: Diagnosis not present

## 2017-01-27 ENCOUNTER — Other Ambulatory Visit: Payer: Self-pay | Admitting: Pulmonary Disease

## 2017-01-31 ENCOUNTER — Other Ambulatory Visit: Payer: Self-pay | Admitting: Pulmonary Disease

## 2017-02-07 DIAGNOSIS — L570 Actinic keratosis: Secondary | ICD-10-CM | POA: Diagnosis not present

## 2017-02-07 DIAGNOSIS — L821 Other seborrheic keratosis: Secondary | ICD-10-CM | POA: Diagnosis not present

## 2017-02-14 ENCOUNTER — Other Ambulatory Visit: Payer: Self-pay | Admitting: Pulmonary Disease

## 2017-02-28 ENCOUNTER — Other Ambulatory Visit: Payer: Self-pay | Admitting: Pulmonary Disease

## 2017-03-19 ENCOUNTER — Other Ambulatory Visit: Payer: Self-pay | Admitting: Pulmonary Disease

## 2017-04-20 ENCOUNTER — Other Ambulatory Visit: Payer: Self-pay | Admitting: Pulmonary Disease

## 2017-05-10 ENCOUNTER — Other Ambulatory Visit: Payer: Self-pay | Admitting: Pulmonary Disease

## 2017-05-12 NOTE — Progress Notes (Signed)
Carlos [name] was seen today in the movement disorders clinic for neurologic consultation at the request of [name], MD.  This patient is accompanied in the office by his spouse who supplements the history. The consultation is for the evaluation of tremor.  Pt states that he noted stiffness in the arm about a year ago and then 6 months ago he noted tremor in the right arm (tremor noted in med records in 07/2015).  Wife notes tremor in the R arm when driving or when stressed in traffic.  She will note in when the hand is hanging loose or if hanging across her in bed.   08/09/16 update:  The patient follows up today, accompanied by his wife and son who supplement the history.  The patient has a new diagnosis of Parkinson's disease.  He got clearance from his cardiologist and pulmonologist to participate in Parkinson's related exercises.  The patient is now participating in rock steady boxing in [place] and loves it.  He goes 3 days a week and goes to [facility] cycle class one day a week.  His family has noticed an improvement in posture and ability to ambulate.  The patient feels great.  01/08/17 update:  Pt seen today in f/u, accompanied by his wife and daughter who supplement the history.  By his choice, he is on no PD meds.  He is exercising faithfully (boxing 3 days per week, [employer] cycle class).  He is doing well.  Pt denies falls.  Pt denies lightheadedness, near syncope.  No hallucinations.  Mood has been good.  Wife indicates more trouble tying his ties.  Also more trouble with handwriting.  Some concerns with memory by family.    05/14/17 update:  Pt in for PD f/u.  This patient is accompanied in the office by his family (wife, son, daughter) who supplements the history.  He has refused meds thus far.  Pt denies falls.  Pt denies lightheadedness, near syncope.  No hallucinations.  Mood has been good.  He continues to exercise regularly with RSB and he is cycling at home with RPM between 80-90.  He is even going to a boxing class in [place] when he visits his son.  he saw Dr. [name] for neuropscyhometric testing on 01/10/17 and subsequently had a feedback session with him where results and recommendations were given to him.  These are detailed within the chart.  This demonstrated MCI, but no dementia   ALLERGIES:  No Known Allergies  CURRENT MEDICATIONS:  Outpatient Encounter Prescriptions as of 05/14/2017  Medication Sig  . amoxicillin (AMOXIL) 500 MG capsule 4 tabs by mouth 1 hr prior to dental procedures.  Marland Kitchen atenolol (TENORMIN) 50 MG tablet Take 25 mg by mouth daily.  Marland Kitchen ELIQUIS 5 MG TABS tablet TAKE 1 TABLET BY MOUTH TWICE DAILY  . felodipine (PLENDIL) 10 MG 24 hr tablet TAKE 1 TABLET BY MOUTH EVERY DAY  . losartan-hydrochlorothiazide (HYZAAR) 100-12.5 MG tablet TAKE 1 TABLET BY MOUTH DAILY  . Multiple Vitamin (MULTIVITAMIN) tablet Take 1 tablet by mouth daily.    . simvastatin (ZOCOR) 40 MG tablet TAKE 1 TABLET BY MOUTH EVERY NIGHT AT BEDTIME  . [DISCONTINUED] felodipine (PLENDIL) 10 MG 24 hr tablet TAKE 1 TABLET BY MOUTH EVERY DAY  . [DISCONTINUED] losartan-hydrochlorothiazide (HYZAAR) 100-12.5 MG tablet TAKE 1 TABLET BY MOUTH DAILY  . [DISCONTINUED] simvastatin (ZOCOR) 40 MG tablet TAKE 1 TABLET BY MOUTH EVERY NIGHT AT BEDTIME   No facility-administered encounter medications on  file as of 05/14/2017.     PAST MEDICAL HISTORY:   Past Medical History:  Diagnosis Date  . Allergic rhinitis   . Allergy   . Anxiety   . Aortic insufficiency    mild... echo... 02/2010  . Atrial flutter ([place]) 02/03/2010   Consult by Dr. Lovena Le... June, 2011.... plan rate control...  Coumadin not needed... ablation if rate not controlled well  . Basal cell carcinoma   . Chest pain    Nuclear, March, 2010, no ischemia  . Degenerative joint disease   . Diastolic dysfunction    EF 50-55%... echo.. 02/2010 (55-60% echo 2009)  . Dyslipidemia   . Ejection fraction    EF 50-55%, echo, June, 2011  .  Hemorrhoids   . HEMORRHOIDS 10/21/2007   Qualifier: History of  By: [name], [name]   . HTN (hypertension)   . Incomplete RBBB    IRBBB; sinus bradycardia w/ PVCs  . LVH (left ventricular hypertrophy)    moderate... echo... 02/2010  . Mitral regurgitation    mild... echo.. 02/2010  . PVC (premature ventricular contraction)   . Renal calculus   . Sinus bradycardia     PAST SURGICAL HISTORY:   Past Surgical History:  Procedure Laterality Date  . COLONOSCOPY    . KIDNEY STONE SURGERY    . POLYPECTOMY      SOCIAL HISTORY:   Social History   Social History  . Marital status: Married    Spouse name: [name]  . Number of children: 2  . Years of education: N/A   Occupational History  . retired     Risk analyst   Social History Main Topics  . Smoking status: Never Smoker  . Smokeless tobacco: Never Used  . Alcohol use 0.0 - 0.6 oz/week     Comment: etoh; glass of wine one time a week   . Drug use: No  . Sexual activity: Not on file   Other Topics Concern  . Not on file   Social History Narrative   Married- wife patient of Dr. [name]. Son and daughter in 60s in 2018. 55 47 year old grandchild.  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 Active with parkinsons exercise class- boxing and cycling    FAMILY HISTORY:   Family Status  Relation Status  . Mother Deceased       stroke  . Father Deceased at age 43       "old age"  . Sister Alive       healthy  . Sister Alive       healthy  . Child Alive       2, healthy  . [name] (Not Specified)  . [name] (Not Specified)    ROS:  A complete 10 system review of systems was obtained and was unremarkable apart from what is mentioned above.  PHYSICAL EXAMINATION:    VITALS:   Vitals:   05/14/17 1258  BP: 110/70  Pulse: 81  SpO2: 97%  Weight: 174 lb 1 oz (79 kg)  Height: 6'  1" (1.854 m)    GEN:  The patient appears stated age and is in NAD. HEENT:  Normocephalic, atraumatic.  The mucous membranes are moist. The superficial temporal arteries are without ropiness or tenderness. CV:  irreg Lungs:  CTAB Neck/HEME:  There are  no carotid bruits bilaterally.  Neurological examination:  Orientation: The patient is alert and oriented x3.  Cranial nerves: There is good facial symmetry.  The speech is fluent and clear. Soft palate rises symmetrically and there is no tongue deviation. Hearing is intact to conversational tone. Sensation: Sensation is intact to light touch throughout Motor: Strength is 5/5 in the bilateral upper and lower extremities.   Shoulder shrug is equal and symmetric.  There is no pronator drift.  Movement examination: Tone: There is normal tone in the UE/LE Abnormal movements: There is mod RUE resting tremor Coordination:  There is some trouble with alternation of supination/pronation of the forearm on the right.  All other RAMs are normal Gait and Station: The patient has no difficulty arising out of a deep-seated chair without the use of the hands. The patient's stride length is normal with decreased arm swing on the right.  Able to run down the hall.  Negative pull test.    LABS    Chemistry      Component Value Date/Time   NA 141 02/06/2016 1223   K 3.7 02/06/2016 1223   CL 101 02/06/2016 1223   CO2 32 02/06/2016 1223   BUN 16 02/06/2016 1223   CREATININE 0.85 02/06/2016 1223      Component Value Date/Time   CALCIUM 10.0 02/06/2016 1223   ALKPHOS 67 02/06/2016 1223   AST 22 02/06/2016 1223   ALT 23 02/06/2016 1223   BILITOT 1.3 (H) 02/06/2016 1223     Lab Results  Component Value Date   WBC 7.4 02/06/2016   HGB 16.7 02/06/2016   HCT 49.7 02/06/2016   MCV 89.0 02/06/2016   PLT 164.0 02/06/2016   Lab Results  Component Value Date   TSH 1.12 02/06/2016   No results found for: VITAMINB12   ASSESSMENT/PLAN:  1.   Idiopathic Parkinson's disease.  He has Hoehn and Yoehr stage 1 disease.  -Congratulated the patient on his exercise, including rock steady boxing.  -talked about prognosis/pathophys/future expectations in detail as patients son not present at previous visit  -he still wants no medication.  Talked about r/b of it.    2.  MCI  -had neurocognitive testing in 01/2017 with Dr. [name] and evidence of MCI without evidence of dementia.  -talked about mental stimulation, which Dr. [name] discussed with him as well.  Started lumoscity but has stopped.  Encouraged him to get back in on that  3.  HTN  -on lopressor.  May be on this for aflutter/afib.  Will need to closely watch his BP as it is already very low but he is asymptommatic.  Is seeing Dr. [name] who is monitoring closely.  Will monitor with him.  4.  Follow up is anticipated in the next few months, sooner should new neurologic issues arise.  Family had multiple questions and answered to best of my ability today.  Much greater than 50% of this visit was spent in counseling and coordinating care.  Total face to face time:  25

## 2017-05-14 ENCOUNTER — Ambulatory Visit (INDEPENDENT_AMBULATORY_CARE_PROVIDER_SITE_OTHER): Payer: Medicare Other | Admitting: Neurology

## 2017-05-14 ENCOUNTER — Encounter: Payer: Self-pay | Admitting: Neurology

## 2017-05-14 VITALS — BP 110/70 | HR 81 | Ht 73.0 in | Wt 174.1 lb

## 2017-05-14 DIAGNOSIS — G20 Parkinson's disease: Secondary | ICD-10-CM | POA: Diagnosis not present

## 2017-05-14 DIAGNOSIS — G3184 Mild cognitive impairment, so stated: Secondary | ICD-10-CM

## 2017-06-13 ENCOUNTER — Ambulatory Visit: Payer: Medicare Other | Admitting: Family Medicine

## 2017-06-20 ENCOUNTER — Ambulatory Visit (INDEPENDENT_AMBULATORY_CARE_PROVIDER_SITE_OTHER): Payer: Medicare Other | Admitting: Family Medicine

## 2017-06-20 ENCOUNTER — Encounter: Payer: Self-pay | Admitting: Family Medicine

## 2017-06-20 VITALS — BP 100/68 | HR 68 | Temp 97.5°F | Ht 73.0 in | Wt 172.8 lb

## 2017-06-20 DIAGNOSIS — E785 Hyperlipidemia, unspecified: Secondary | ICD-10-CM | POA: Diagnosis not present

## 2017-06-20 DIAGNOSIS — M25511 Pain in right shoulder: Secondary | ICD-10-CM | POA: Diagnosis not present

## 2017-06-20 DIAGNOSIS — I480 Paroxysmal atrial fibrillation: Secondary | ICD-10-CM

## 2017-06-20 DIAGNOSIS — G8929 Other chronic pain: Secondary | ICD-10-CM

## 2017-06-20 DIAGNOSIS — Z23 Encounter for immunization: Secondary | ICD-10-CM | POA: Diagnosis not present

## 2017-06-20 DIAGNOSIS — G20 Parkinson's disease: Secondary | ICD-10-CM

## 2017-06-20 DIAGNOSIS — I10 Essential (primary) hypertension: Secondary | ICD-10-CM

## 2017-06-20 LAB — CBC
HEMATOCRIT: 49.3 % (ref 39.0–52.0)
Hemoglobin: 16.3 g/dL (ref 13.0–17.0)
MCHC: 33 g/dL (ref 30.0–36.0)
MCV: 92.8 fl (ref 78.0–100.0)
PLATELETS: 137 10*3/uL — AB (ref 150.0–400.0)
RBC: 5.31 Mil/uL (ref 4.22–5.81)
RDW: 14.2 % (ref 11.5–15.5)
WBC: 5.3 10*3/uL (ref 4.0–10.5)

## 2017-06-20 LAB — LIPID PANEL
CHOLESTEROL: 101 mg/dL (ref 0–200)
HDL: 53.5 mg/dL (ref >39.00)
LDL CALC: 38 mg/dL (ref 0–99)
NonHDL: 47.22
TRIGLYCERIDES: 47 mg/dL (ref 0.0–149.0)
Total CHOL/HDL Ratio: 2
VLDL: 9.4 mg/dL (ref 0.0–40.0)

## 2017-06-20 LAB — COMPREHENSIVE METABOLIC PANEL
ALBUMIN: 4.7 g/dL (ref 3.5–5.2)
ALT: 22 U/L (ref 0–53)
AST: 23 U/L (ref 0–37)
Alkaline Phosphatase: 65 U/L (ref 39–117)
BUN: 20 mg/dL (ref 6–23)
CALCIUM: 10 mg/dL (ref 8.4–10.5)
CHLORIDE: 101 meq/L (ref 96–112)
CO2: 33 mEq/L — ABNORMAL HIGH (ref 19–32)
CREATININE: 1 mg/dL (ref 0.40–1.50)
GFR: 77 mL/min (ref >60.00)
Glucose, Bld: 105 mg/dL — ABNORMAL HIGH (ref 70–99)
POTASSIUM: 4.3 meq/L (ref 3.5–5.1)
Sodium: 141 mEq/L (ref 135–145)
Total Bilirubin: 1.7 mg/dL — ABNORMAL HIGH (ref 0.2–1.2)
Total Protein: 7.2 g/dL (ref 6.0–8.3)

## 2017-06-20 MED ORDER — FELODIPINE ER 5 MG PO TB24: 5.0000 mg | ORAL_TABLET | Freq: Every day | ORAL | 3 refills | Status: DC

## 2017-06-20 MED ORDER — APIXABAN 5 MG PO TABS: 5.0000 mg | ORAL_TABLET | Freq: Two times a day (BID) | ORAL | 3 refills | Status: DC

## 2017-06-20 MED ORDER — LOSARTAN POTASSIUM-HCTZ 100-12.5 MG PO TABS: 0.5000 | ORAL_TABLET | Freq: Every day | ORAL | 3 refills | Status: DC

## 2017-06-20 MED ORDER — SIMVASTATIN 40 MG PO TABS: 40.0000 mg | ORAL_TABLET | Freq: Every day | ORAL | 3 refills | Status: DC

## 2017-06-20 NOTE — Assessment & Plan Note (Signed)
S:  continues to follow with Dr. Carles Collet. Enjoying his boxing and cycling classes. Even walked 3 miles yesterday with temperature cooling off. Remains off medication.   Both elbows bother him some. Some pain in right shoulder - particularly with rolling in bed. Tremor largely unchanged.  A/P: remains off rx. Has some symptoms which he attributes to parkinsons like right shoulder pain- see separate section

## 2017-06-20 NOTE — Assessment & Plan Note (Signed)
S: controlled on atenolol 25mg , felodipine 10mg , half of hyzaar 100-12.5mg . Home BPs 88-828 systolic BP Readings from Last 3 Encounters:  06/20/17 100/68  05/14/17 110/70  01/08/17 112/60  A/P: We discussed blood pressure goal of <140/90. Continue current meds:  But reduce felodipine to 5mg  with lower BP

## 2017-06-20 NOTE — Patient Instructions (Addendum)
We discussed blood pressure goal of <140/90. Continue current meds:  But reduce felodipine to 5mg    Please stop by lab before you go  Stop by check out to schedule sports medicine visit

## 2017-06-20 NOTE — Assessment & Plan Note (Signed)
S: follows with Dr. Lovena Le. Rate controlled on atenolol. Eliquis for anticoagulation. TOday appears to be in atrial fibrillation. Updated problem to a. Fib. Previously listed as flutter A/P: continue current medications

## 2017-06-20 NOTE — Assessment & Plan Note (Signed)
S: very well controlled on simvastatin 40mg . No myalgias.  Lab Results  Component Value Date   CHOL 120 02/06/2016   HDL 58.00 02/06/2016   LDLCALC 49 02/06/2016   TRIG 66.0 02/06/2016   CHOLHDL 2 02/06/2016   A/P: fasting this AM- update lipids with LDL goal under 70

## 2017-06-20 NOTE — Progress Notes (Signed)
Subjective:  Carlos [name] is a 76 y.o. year old very pleasant male patient who presents for/with See problem oriented charting ROS- resting tremor, mild bilateral elbow pain. No chest pain or shortness of breath reported. Exercises regularly.    Past Medical History-  Patient Active Problem List   Diagnosis Date Noted  . Parkinson's disease ([place]) 03/09/2016    Priority: High  . PAF (paroxysmal atrial fibrillation) ([place]) 02/03/2010    Priority: High  . Aortic insufficiency 07/20/2015    Priority: Medium  . Chest pain     Priority: Medium  . Essential hypertension     Priority: Medium  . Mitral regurgitation     Priority: Medium  . Diastolic dysfunction     Priority: Medium  . Dyslipidemia     Priority: Medium  . Allergy 12/07/2016    Priority: Low  . Sinus node dysfunction ([facility]) 08/02/2015    Priority: Low  . Hx of colonic polyps 01/13/2013    Priority: Low  . Incomplete RBBB     Priority: Low  . Ejection fraction     Priority: Low  . LVH (left ventricular hypertrophy)     Priority: Low  . Sinus bradycardia     Priority: Low  . PVC (premature ventricular contraction)     Priority: Low  . Allergic rhinitis 12/28/2009    Priority: Low  . History of basal cell carcinoma of skin 10/26/2008    Priority: Low  . Anxiety state 10/21/2007    Priority: Low  . Osteoarthritis 10/21/2007    Priority: Low  . RENAL CALCULUS, HX OF 10/20/2007    Priority: Low    Medications- reviewed and updated Current Outpatient Prescriptions  Medication Sig Dispense Refill  . amoxicillin (AMOXIL) 500 MG capsule 4 tabs by mouth 1 hr prior to dental procedures.    Marland Kitchen atenolol (TENORMIN) 50 MG tablet Take 25 mg by mouth daily.    Marland Kitchen ELIQUIS 5 MG TABS tablet TAKE 1 TABLET BY MOUTH TWICE DAILY 60 tablet 11  . felodipine (PLENDIL) 10 MG 24 hr tablet TAKE 1 TABLET BY MOUTH EVERY DAY 90 tablet 0  . losartan-hydrochlorothiazide (HYZAAR) 100-12.5 MG tablet TAKE 1 TABLET BY MOUTH DAILY 90 tablet 0   . Multiple Vitamin (MULTIVITAMIN) tablet Take 1 tablet by mouth daily.      . simvastatin (ZOCOR) 40 MG tablet TAKE 1 TABLET BY MOUTH EVERY NIGHT AT BEDTIME 90 tablet 1   No current facility-administered medications for this visit.     Objective: BP 100/68 (BP Location: Left Arm, Patient Position: Sitting, Cuff Size: Normal)   Pulse 68   Temp (!) 97.5 F (36.4 C) (Oral)   Ht 6\' 1"  (1.854 m)   Wt 172 lb 12.8 oz (78.4 kg)   SpO2 96%   BMI 22.80 kg/m  Gen: NAD, resting comfortably CV: RRR no murmurs rubs or gallops Lungs: CTAB no crackles, wheeze, rhonchi Ext: no edema Skin: warm, dry Neuro: Resting tremor noted right hand  Right Shoulder: Inspection reveals no abnormalities, atrophy or asymmetry. Palpation is normal with no tenderness over AC joint or bicipital groove. ROM is full in all planes. Rotator cuff strength normal throughout. Does have some pain with Neer and Hawkin's though mild   Assessment/Plan:  Chronic right shoulder pain - Plan: Ambulatory referral to Sports Medicine S: over a year of right shoulder pain- if pushes on it. If rolls over in bed. Enough pain to wake him up.  A/P: Possible rotator cuff tendinopathy vs.  Arthritis- refer to sports medicine. Wonder if PT may help.   Essential hypertension S: controlled on atenolol 25mg , felodipine 10mg , half of hyzaar 100-12.5mg . Home BPs 41-962 systolic BP Readings from Last 3 Encounters:  06/20/17 100/68  05/14/17 110/70  01/08/17 112/60  A/P: We discussed blood pressure goal of <140/90. Continue current meds:  But reduce felodipine to 5mg  with lower BP  PAF (paroxysmal atrial fibrillation) ([place]) S: follows with Dr. Lovena Le. Rate controlled on atenolol. Eliquis for anticoagulation. TOday appears to be in atrial fibrillation. Updated problem to a. Fib. Previously listed as flutter A/P: continue current medications  Dyslipidemia S: very well controlled on simvastatin 40mg . No myalgias.  Lab Results   Component Value Date   CHOL 120 02/06/2016   HDL 58.00 02/06/2016   LDLCALC 49 02/06/2016   TRIG 66.0 02/06/2016   CHOLHDL 2 02/06/2016   A/P: fasting this AM- update lipids with LDL goal under 70  Parkinson's disease ([place]) S:  continues to follow with Dr. Carles Collet. Enjoying his boxing and cycling classes. Even walked 3 miles yesterday with temperature cooling off. Remains off medication.   Both elbows bother him some. Some pain in right shoulder - particularly with rolling in bed. Tremor largely unchanged.  A/P: remains off rx. Has some symptoms which he attributes to parkinsons like right shoulder pain- see separate section   Future Appointments Date Time Provider [place]  06/25/2017 10:20 AM Gerda Diss, DO [facility] None  09/24/2017 1:00 PM Tat, Eustace Quail, DO [facility] None  12/17/2017 9:30 AM [name] Brayton Mars, MD [facility] None  physical 6 months  Orders Placed This Encounter  Procedures  . Flu vaccine HIGH DOSE PF  . CBC    [place]  . Comprehensive metabolic panel    [facility]    Order Specific Question:   Has the patient fasted?    Answer:   No  . Lipid panel    [place]    Order Specific Question:   Has the patient fasted?    Answer:   No  . Ambulatory referral to Sports Medicine    Referral Priority:   Routine    Referral Type:   Consultation    Referred to Provider:   Gerda Diss, DO    Number of Visits Requested:   1    Meds ordered this encounter  Medications  . felodipine (PLENDIL) 5 MG 24 hr tablet    Sig: Take 1 tablet (5 mg total) by mouth daily.    Dispense:  90 tablet    Refill:  3  . simvastatin (ZOCOR) 40 MG tablet    Sig: Take 1 tablet (40 mg total) by mouth at bedtime.    Dispense:  90 tablet    Refill:  3  . apixaban (ELIQUIS) 5 MG TABS tablet    Sig: Take 1 tablet (5 mg total) by mouth 2 (two) times daily.    Dispense:  180 tablet    Refill:  3  . losartan-hydrochlorothiazide (HYZAAR) 100-12.5 MG tablet    Sig: Take 0.5  tablets by mouth daily.    Dispense:  46 tablet    Refill:  3    Return precautions advised.  Garret Reddish, MD

## 2017-06-21 ENCOUNTER — Telehealth: Payer: Self-pay | Admitting: Family Medicine

## 2017-06-21 NOTE — Telephone Encounter (Signed)
[name] returning missed call.  Ty,  -LL

## 2017-06-24 NOTE — Telephone Encounter (Signed)
[name] calling to get lab results. Call [phone] to advise, okay to leave a detailed message.

## 2017-06-24 NOTE — Telephone Encounter (Signed)
Called and left a voicemail message asking for a return phone call 

## 2017-06-25 ENCOUNTER — Ambulatory Visit: Payer: Medicare Other | Admitting: Sports Medicine

## 2017-07-01 ENCOUNTER — Ambulatory Visit: Payer: Medicare Other | Admitting: Sports Medicine

## 2017-07-05 ENCOUNTER — Other Ambulatory Visit: Payer: Self-pay | Admitting: Pulmonary Disease

## 2017-07-05 ENCOUNTER — Ambulatory Visit: Payer: Medicare Other | Admitting: Sports Medicine

## 2017-07-12 ENCOUNTER — Ambulatory Visit: Payer: Medicare Other | Admitting: Sports Medicine

## 2017-08-29 ENCOUNTER — Other Ambulatory Visit: Payer: Self-pay | Admitting: Pulmonary Disease

## 2017-09-23 NOTE — Progress Notes (Signed)
Carlos [name] was seen today in the movement disorders clinic for neurologic consultation at the request of [name], MD.  This patient is accompanied in the office by his spouse who supplements the history. The consultation is for the evaluation of tremor.  Pt states that he noted stiffness in the arm about a year ago and then 6 months ago he noted tremor in the right arm (tremor noted in med records in 07/2015).  Wife notes tremor in the R arm when driving or when stressed in traffic.  She will note in when the hand is hanging loose or if hanging across her in bed.   08/09/16 update:  The patient follows up today, accompanied by his wife and son who supplement the history.  The patient has a new diagnosis of Parkinson's disease.  He got clearance from his cardiologist and pulmonologist to participate in Parkinson's related exercises.  The patient is now participating in rock steady boxing in [place] and loves it.  He goes 3 days a week and goes to [facility] cycle class one day a week.  His family has noticed an improvement in posture and ability to ambulate.  The patient feels great.  01/08/17 update:  Pt seen today in f/u, accompanied by his wife and daughter who supplement the history.  By his choice, he is on no PD meds.  He is exercising faithfully (boxing 3 days per week, [employer] cycle class).  He is doing well.  Pt denies falls.  Pt denies lightheadedness, near syncope.  No hallucinations.  Mood has been good.  Wife indicates more trouble tying his ties.  Also more trouble with handwriting.  Some concerns with memory by family.    05/14/17 update:  Pt in for PD f/u.  This patient is accompanied in the office by his family (wife, son, daughter) who supplements the history.  He has refused meds thus far.  Pt denies falls.  Pt denies lightheadedness, near syncope.  No hallucinations.  Mood has been good.  He continues to exercise regularly with RSB and he is cycling at home with RPM between 80-90.  He is even going to a boxing class in [place] when he visits his son.  he saw Dr. [name] for neuropscyhometric testing on 01/10/17 and subsequently had a feedback session with him where results and recommendations were given to him.  These are detailed within the chart.  This demonstrated MCI, but no dementia  09/24/17 update: Patient is seen today in follow-up for Parkinson's disease.  He is accompanied by his wife, son and daughter in law who supplements the history.  He is on no medication, by his choice.  Pt denies falls.  Pt denies lightheadedness, near syncope.  No hallucinations.  Mood has been good.  The records that were made available to me were reviewed.  Saw Dr. [name] in October.  He is doing RSB.  His shoulder is sore but he doesn't think that it is from boxing.  It has been "bothering" him for a few years per wife, and having more trouble rolling over in bed.  He has been massaging it with tennis ball and it has helped.  His nose waters all the time.  Daughter in law notes processing speed slow.   ALLERGIES:  No Known Allergies  CURRENT MEDICATIONS:  Outpatient Encounter Medications as of 09/24/2017  Medication Sig  . amoxicillin (AMOXIL) 500 MG capsule 4 tabs by mouth 1 hr prior to dental procedures.  Marland Kitchen  apixaban (ELIQUIS) 5 MG TABS tablet Take 1 tablet (5 mg total) by mouth 2 (two) times daily.  Marland Kitchen atenolol (TENORMIN) 25 MG tablet TAKE 2 TABLETS BY MOUTH EVERY DAY  . felodipine (PLENDIL) 5 MG 24 hr tablet Take 1 tablet (5 mg total) by mouth daily.  Marland Kitchen losartan-hydrochlorothiazide (HYZAAR) 100-12.5 MG tablet Take 0.5 tablets by mouth daily.  Marland Kitchen losartan-hydrochlorothiazide (HYZAAR) 100-12.5 MG tablet TAKE 1 TABLET BY MOUTH DAILY  . Multiple Vitamin (MULTIVITAMIN) tablet Take 1 tablet by mouth daily.    . simvastatin (ZOCOR) 40 MG tablet Take 1 tablet (40 mg total) by mouth at bedtime.  . [DISCONTINUED] atenolol (TENORMIN) 50 MG tablet Take 25 mg by mouth daily.   No facility-administered  encounter medications on file as of 09/24/2017.     PAST MEDICAL HISTORY:   Past Medical History:  Diagnosis Date  . Allergic rhinitis   . Allergy   . Anxiety   . Aortic insufficiency    mild... echo... 02/2010  . Atrial flutter ([place]) 02/03/2010   Consult by Dr. Lovena Le... June, 2011.... plan rate control...  Coumadin not needed... ablation if rate not controlled well  . Basal cell carcinoma   . Chest pain    Nuclear, March, 2010, no ischemia  . Degenerative joint disease   . Diastolic dysfunction    EF 50-55%... echo.. 02/2010 (55-60% echo 2009)  . Dyslipidemia   . Ejection fraction    EF 50-55%, echo, June, 2011  . Hemorrhoids   . HEMORRHOIDS 10/21/2007   Qualifier: History of  By: [name], [name]   . HTN (hypertension)   . Incomplete RBBB    IRBBB; sinus bradycardia w/ PVCs  . LVH (left ventricular hypertrophy)    moderate... echo... 02/2010  . Mitral regurgitation    mild... echo.. 02/2010  . PVC (premature ventricular contraction)   . Renal calculus   . Sinus bradycardia     PAST SURGICAL HISTORY:   Past Surgical History:  Procedure Laterality Date  . COLONOSCOPY    . KIDNEY STONE SURGERY    . POLYPECTOMY      SOCIAL HISTORY:   Social History   Socioeconomic History  . Marital status: Married    Spouse name: Corliss Lamartina  . Number of children: 2  . Years of education: Not on file  . Highest education level: Not on file  Social Needs  . Financial resource strain: Not on file  . Food insecurity - worry: Not on file  . Food insecurity - inability: Not on file  . Transportation needs - medical: Not on file  . Transportation needs - non-medical: Not on file  Occupational History  . 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 Smoking status: Never Smoker  . Smokeless tobacco: Never Used  Substance and Sexual Activity  . Alcohol use: Yes    Alcohol/week: 0.0 - 0.6 oz    Comment: etoh; glass of wine one time a week   . Drug use: No  .  Sexual activity: Not on file  Other Topics Concern  . Not on file  Social History Narrative   Married- wife patient of Dr. [name]. Son and daughter in 56s in 2018. 12 35 year old grandchild.  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 Active with parkinsons exercise class- boxing  and cycling    FAMILY HISTORY:   Family Status  Relation Name Status  . Mother  Deceased       stroke  . Father  Deceased at age 71       "old age"  . Sister  Alive       healthy  . Sister  Alive       healthy  . Child  Alive       2, healthy  . [name]  (Not Specified)  . [name]  (Not Specified)    ROS:  A complete 10 system review of systems was obtained and was unremarkable apart from what is mentioned above.  PHYSICAL EXAMINATION:    VITALS:   Vitals:   09/24/17 1257  BP: 126/78  Pulse: 73  SpO2: 97%  Weight: 177 lb (80.3 kg)  Height: 6\' 1"  (1.854 m)    GEN:  The patient appears stated age and is in NAD. HEENT:  Normocephalic, atraumatic.  The mucous membranes are moist. The superficial temporal arteries are without ropiness or tenderness. CV:  irreg Lungs:  CTAB Neck/HEME:  There are no carotid bruits bilaterally.  Neurological examination:  Orientation: The patient is alert and oriented x3.  Cranial nerves: There is good facial symmetry.  The speech is fluent and clear. Soft palate rises symmetrically and there is no tongue deviation. Hearing is intact to conversational tone. Sensation: Sensation is intact to light touch throughout Motor: Strength is 5/5 in the bilateral upper and lower extremities.   Shoulder shrug is equal and symmetric.  There is no pronator drift.  Movement examination: Tone: There is normal tone in the UE/LE Abnormal movements: There is intermittent RUE resting tremor Coordination:  There is some trouble with alternation of  supination/pronation of the forearm on the right.  All other RAMs are normal Gait and Station: The patient has no difficulty arising out of a deep-seated chair without the use of the hands. The patient's stride length is normal with decreased arm swing on the right.  Positive pull test today  LABS    Chemistry      Component Value Date/Time   NA 141 06/20/2017 1147   K 4.3 06/20/2017 1147   CL 101 06/20/2017 1147   CO2 33 (H) 06/20/2017 1147   BUN 20 06/20/2017 1147   CREATININE 1.00 06/20/2017 1147      Component Value Date/Time   CALCIUM 10.0 06/20/2017 1147   ALKPHOS 65 06/20/2017 1147   AST 23 06/20/2017 1147   ALT 22 06/20/2017 1147   BILITOT 1.7 (H) 06/20/2017 1147     Lab Results  Component Value Date   WBC 5.3 06/20/2017   HGB 16.3 06/20/2017   HCT 49.3 06/20/2017   MCV 92.8 06/20/2017   PLT 137.0 (L) 06/20/2017   Lab Results  Component Value Date   TSH 1.12 02/06/2016   No results found for: VITAMINB12   ASSESSMENT/PLAN:  1.  Idiopathic Parkinson's disease.  He has Hoehn and Yoehr stage 1 disease.  -Congratulated the patient on his exercise, including rock steady boxing.  -start carbidopa/levodopa 25/100 and work to 1 po tid.  Hoping that will help with shoulder pain.  If not, will send to PT or ortho.    -information given on drumming program  -information given to wife on caregiver support group  2.  MCI  -had neurocognitive testing in 01/2017 with Dr. [name] and evidence of MCI without evidence of dementia.  -using lumoscity now daily  3.  HTN  -on lopressor.  May be on this for aflutter/afib.  Will need to closely watch his BP as it is already very low but he is asymptommatic.  Is seeing Dr. [name] who is monitoring closely.  Will monitor with him.  4.  Vasomotor rhinorrhea  -told him that can be associated with PD.  No tx is recommended  5.  Follow up is anticipated in the next few months, sooner should new neurologic issues arise.  Much greater  than 50% of this visit was spent in counseling and coordinating care.  Total face to face time:  25 min

## 2017-09-24 ENCOUNTER — Encounter: Payer: Self-pay | Admitting: Internal Medicine

## 2017-09-24 ENCOUNTER — Ambulatory Visit: Payer: Medicare Other | Admitting: Internal Medicine

## 2017-09-24 ENCOUNTER — Ambulatory Visit: Payer: Medicare Other | Admitting: Neurology

## 2017-09-24 ENCOUNTER — Encounter: Payer: Self-pay | Admitting: Neurology

## 2017-09-24 VITALS — BP 112/74 | HR 83 | Resp 16 | Ht 73.0 in | Wt 177.6 lb

## 2017-09-24 VITALS — BP 126/78 | HR 73 | Ht 73.0 in | Wt 177.0 lb

## 2017-09-24 DIAGNOSIS — G20 Parkinson's disease: Secondary | ICD-10-CM

## 2017-09-24 DIAGNOSIS — I480 Paroxysmal atrial fibrillation: Secondary | ICD-10-CM

## 2017-09-24 DIAGNOSIS — G8929 Other chronic pain: Secondary | ICD-10-CM | POA: Diagnosis not present

## 2017-09-24 DIAGNOSIS — M25511 Pain in right shoulder: Secondary | ICD-10-CM

## 2017-09-24 MED ORDER — CARBIDOPA-LEVODOPA 25-100 MG PO TABS: 1.0000 | ORAL_TABLET | Freq: Three times a day (TID) | ORAL | 1 refills | Status: DC

## 2017-09-24 NOTE — Progress Notes (Signed)
HPI Mr. Clemence returns today for followup. He is a very pleasant 77 year old man with palpitations and documented paroxysmal atrial flutter as well as brief episodes of bradycardia. He has been nearly asymptomatic. He denies chest pain, shortness of breath, and syncope. He has retired from [employer]. He has been diagnosed with early Parkinson's disease. He has minimal tremor. He has minimal palpitations and is about to start neuro rehab which has gone well. He is boxing. He also walks regularly. His atrial fib has become persistent but he is minimally symptomatic.  No Known Allergies   Current Outpatient Medications  Medication Sig Dispense Refill  . amoxicillin (AMOXIL) 500 MG capsule 4 tabs by mouth 1 hr prior to dental procedures.    Marland Kitchen apixaban (ELIQUIS) 5 MG TABS tablet Take 1 tablet (5 mg total) by mouth 2 (two) times daily. 180 tablet 3  . atenolol (TENORMIN) 25 MG tablet TAKE 2 TABLETS BY MOUTH EVERY DAY 180 tablet 0  . felodipine (PLENDIL) 5 MG 24 hr tablet Take 1 tablet (5 mg total) by mouth daily. 90 tablet 3  . losartan-hydrochlorothiazide (HYZAAR) 100-12.5 MG tablet Take 0.5 tablets by mouth daily. 46 tablet 3  . losartan-hydrochlorothiazide (HYZAAR) 100-12.5 MG tablet TAKE 1 TABLET BY MOUTH DAILY 90 tablet 3  . Multiple Vitamin (MULTIVITAMIN) tablet Take 1 tablet by mouth daily.      . simvastatin (ZOCOR) 40 MG tablet Take 1 tablet (40 mg total) by mouth at bedtime. 90 tablet 3   No current facility-administered medications for this visit.      Past Medical History:  Diagnosis Date  . Allergic rhinitis   . Allergy   . Anxiety   . Aortic insufficiency    mild... echo... 02/2010  . Atrial flutter ([place]) 02/03/2010   Consult by Dr. Lovena Le... June, 2011.... plan rate control...  Coumadin not needed... ablation if rate not controlled well  . Basal cell carcinoma   . Chest pain    Nuclear, March, 2010, no ischemia  . Degenerative joint disease   .  Diastolic dysfunction    EF 50-55%... echo.. 02/2010 (55-60% echo 2009)  . Dyslipidemia   . Ejection fraction    EF 50-55%, echo, June, 2011  . Hemorrhoids   . HEMORRHOIDS 10/21/2007   Qualifier: History of  By: [name], [name]   . HTN (hypertension)   . Incomplete RBBB    IRBBB; sinus bradycardia w/ PVCs  . LVH (left ventricular hypertrophy)    moderate... echo... 02/2010  . Mitral regurgitation    mild... echo.. 02/2010  . PVC (premature ventricular contraction)   . Renal calculus   . Sinus bradycardia     ROS:   All systems reviewed and negative except as noted in the HPI.   Past Surgical History:  Procedure Laterality Date  . COLONOSCOPY    . KIDNEY STONE SURGERY    . POLYPECTOMY       Family History  Problem Relation Age of Onset  . Hypertension Mother   . Stroke Mother 43       lived to 75  . Other Father        parkinsons ? 84  . Colon cancer Paternal Aunt   . Colon cancer Paternal Uncle      Social History   Socioeconomic History  . Marital status: Married    Spouse name: Sameer Teeple  . Number of children: 2  . Years of education: Not on file  .  Highest education level: Not on file  Social Needs  . Financial resource strain: Not on file  . Food insecurity - worry: Not on file  . Food insecurity - inability: Not on file  . Transportation needs - medical: Not on file  . Transportation needs - non-medical: Not on file  Occupational History  . 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 Smoking status: Never Smoker  . Smokeless tobacco: Never Used  Substance and Sexual Activity  . Alcohol use: Yes    Alcohol/week: 0.0 - 0.6 oz    Comment: etoh; glass of wine one time a week   . Drug use: No  . Sexual activity: Not on file  Other Topics Concern  . Not on file  Social History Narrative   Married- wife patient of Dr. [name]. Son and daughter in 26s in 2018. 40 6 year old grandchild.  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 Active with parkinsons exercise class- boxing and cycling     BP 112/74   Pulse 83   Resp 16   Ht 6\' 1"  (1.854 m)   Wt 177 lb 9.6 oz (80.6 kg)   SpO2 99%   BMI 23.43 kg/m   Physical Exam:  Well appearing 77 yo man, NAD HEENT: Unremarkable Neck:  6 cm JVD, no thyromegally Lymphatics:  No adenopathy Back:  No CVA tenderness Lungs:  Clear HEART:  IRegular rate rhythm, no murmurs, no rubs, no clicks Abd:  soft, positive bowel sounds, no organomegally, no rebound, no guarding Ext:  2 plus pulses, no edema, no cyanosis, no clubbing Skin:  No rashes no nodules Neuro:  CN II through XII intact, motor grossly intact  EKG - atrial fib with IRBBB   Assess/Plan: 1. Persistent atrial fib - his rate is well controlled and he is minimally symptomatic. Will continue his meds as prescribed. 2. HTN - his blood pressure is well controlled. No change in meds. 3. Sinus node dysfunction - now that he is in atrial fib, his rates are not slow.  Mikle Bosworth.D.

## 2017-09-24 NOTE — Patient Instructions (Addendum)
Start carbidopa/levodopa 25/100, 1/2 tab three times a day 30 min before meals x 1 wk, then 1/2 in am & noon & 1 in evening for a week, then 1/2 in am &1 at noon &one in evening for a week, then 1 tablet three times a day 30 minutes before meals

## 2017-09-24 NOTE — Patient Instructions (Signed)
Medication Instructions:  Your physician recommends that you continue on your current medications as directed. Please refer to the Current Medication list given to you today.   Labwork: None ordered.   Testing/Procedures: None ordered.  Follow-Up: Your physician wants you to follow-up in: One Year with Dr Lovena Le. You will receive a reminder letter in the mail two months in advance. If you don't receive a letter, please call our office to schedule the follow-up appointment.   Any Other Special Instructions Will Be Listed Below (If Applicable).     If you need a refill on your cardiac medications before your next appointment, please call your pharmacy.

## 2017-11-13 ENCOUNTER — Telehealth: Payer: Self-pay | Admitting: Neurology

## 2017-11-13 NOTE — Telephone Encounter (Signed)
Pt and spouse stopped by and asked if Dr Tat can do a letter of opinion for pt for the [place] regarding his Parkinson's and possible exposure when he was in the [employer], he has an appointment at the [place] on the 25th of March and can stop by to pick up the letter and if any further clarification please call pt's spouse so she can explain further

## 2017-11-13 NOTE — Telephone Encounter (Signed)
Spoke with wife and informed her that we usually fill out paperwork provided by the [facility]. She will check into this and provide paperwork to [place] to complete. She did want to let us know her husband was not in [place], but worked at a [employer].

## 2017-11-14 DIAGNOSIS — Z85828 Personal history of other malignant neoplasm of skin: Secondary | ICD-10-CM | POA: Diagnosis not present

## 2017-11-14 DIAGNOSIS — Z08 Encounter for follow-up examination after completed treatment for malignant neoplasm: Secondary | ICD-10-CM | POA: Diagnosis not present

## 2017-11-14 DIAGNOSIS — L570 Actinic keratosis: Secondary | ICD-10-CM | POA: Diagnosis not present

## 2017-11-21 ENCOUNTER — Telehealth: Payer: Self-pay | Admitting: Neurology

## 2017-11-21 NOTE — Telephone Encounter (Addendum)
Received form from patient for the [facility] [place]. The form states, "I have performed an exam on the above patient, reviewed the service treatment medical record of the above patient, and considered the circumstances which unfolded during the [employer] service."   Spoke with Dr. Carles Collet and because the patient was not in [place] or [place] and she has not reviewed his service records we can not complete the form.   Tried to call patient/wife to make them aware with no answer.

## 2017-11-22 NOTE — Telephone Encounter (Signed)
Patient wife is calling back about the paperwork from the [place] and wants to know the status

## 2017-11-22 NOTE — Telephone Encounter (Signed)
[name] made aware of message below.

## 2017-11-22 NOTE — Telephone Encounter (Signed)
Left message on machine for patient to call back.

## 2017-12-13 LAB — HEPATIC FUNCTION PANEL
ALK PHOS: 79 (ref 25–125)
ALT: 22 (ref 10–40)
AST: 23 (ref 14–40)
Bilirubin, Direct: 0.4 (ref 0.01–0.4)
Bilirubin, Total: 1.4

## 2017-12-13 LAB — TSH: TSH: 1.14 (ref 0.41–5.90)

## 2017-12-13 LAB — BASIC METABOLIC PANEL
POTASSIUM: 3.7 (ref 3.4–5.3)
Sodium: 141 (ref 137–147)

## 2017-12-13 LAB — HEMOGLOBIN A1C: HEMOGLOBIN A1C: 5.6

## 2017-12-13 LAB — HM HEPATITIS C SCREENING LAB: HM HEPATITIS C SCREENING: NEGATIVE

## 2017-12-13 LAB — LIPID PANEL
Cholesterol: 106 (ref 0–200)
HDL: 62 (ref 35–70)
LDL CALC: 25
Triglycerides: 75 (ref 40–160)

## 2017-12-13 LAB — CBC AND DIFFERENTIAL
HEMATOCRIT: 51 (ref 41–53)
HEMOGLOBIN: 17.4 (ref 13.5–17.5)
Platelets: 141 — AB (ref 150–399)
WBC: 5.9

## 2017-12-13 LAB — PSA: PSA: 2.56

## 2017-12-17 ENCOUNTER — Ambulatory Visit (INDEPENDENT_AMBULATORY_CARE_PROVIDER_SITE_OTHER): Payer: Medicare Other | Admitting: Family Medicine

## 2017-12-17 ENCOUNTER — Encounter: Payer: Self-pay | Admitting: Family Medicine

## 2017-12-17 VITALS — BP 108/72 | HR 68 | Temp 97.6°F | Ht 73.0 in | Wt 174.8 lb

## 2017-12-17 DIAGNOSIS — Z0000 Encounter for general adult medical examination without abnormal findings: Secondary | ICD-10-CM | POA: Diagnosis not present

## 2017-12-17 DIAGNOSIS — E785 Hyperlipidemia, unspecified: Secondary | ICD-10-CM | POA: Diagnosis not present

## 2017-12-17 DIAGNOSIS — I10 Essential (primary) hypertension: Secondary | ICD-10-CM

## 2017-12-17 DIAGNOSIS — I480 Paroxysmal atrial fibrillation: Secondary | ICD-10-CM

## 2017-12-17 DIAGNOSIS — G20 Parkinson's disease: Secondary | ICD-10-CM | POA: Diagnosis not present

## 2017-12-17 DIAGNOSIS — G20A1 Parkinson's disease without dyskinesia, without mention of fluctuations: Secondary | ICD-10-CM

## 2017-12-17 LAB — COMPREHENSIVE METABOLIC PANEL
ALT: 8 U/L (ref 0–53)
AST: 21 U/L (ref 0–37)
Albumin: 4.4 g/dL (ref 3.5–5.2)
Alkaline Phosphatase: 59 U/L (ref 39–117)
BUN: 17 mg/dL (ref 6–23)
CO2: 26 mEq/L (ref 19–32)
Calcium: 9.7 mg/dL (ref 8.4–10.5)
Chloride: 103 mEq/L (ref 96–112)
Creatinine, Ser: 1.07 mg/dL (ref 0.40–1.50)
GFR: 71.13 mL/min (ref >60.00)
Glucose, Bld: 103 mg/dL — ABNORMAL HIGH (ref 70–99)
Potassium: 4.3 mEq/L (ref 3.5–5.1)
Sodium: 141 mEq/L (ref 135–145)
Total Bilirubin: 1.5 mg/dL — ABNORMAL HIGH (ref 0.2–1.2)
Total Protein: 7.1 g/dL (ref 6.0–8.3)

## 2017-12-17 LAB — CBC
HEMATOCRIT: 47.9 % (ref 39.0–52.0)
HEMOGLOBIN: 16.4 g/dL (ref 13.0–17.0)
MCHC: 34.3 g/dL (ref 30.0–36.0)
MCV: 90.6 fl (ref 78.0–100.0)
Platelets: 128 10*3/uL — ABNORMAL LOW (ref 150.0–400.0)
RBC: 5.29 Mil/uL (ref 4.22–5.81)
RDW: 14 % (ref 11.5–15.5)
WBC: 5.3 10*3/uL (ref 4.0–10.5)

## 2017-12-17 LAB — LDL CHOLESTEROL, DIRECT: Direct LDL: 37 mg/dL

## 2017-12-17 NOTE — Progress Notes (Signed)
Phone: [phone]  Subjective:  Patient presents today for their annual physical. Chief complaint-noted.   See problem oriented charting- ROS- full  review of systems was completed and negative except for: palpitations, neck and joint pain, right hand weakness, tremors  The following were reviewed and entered/updated in epic: Past Medical History:  Diagnosis Date  . Allergic rhinitis   . Allergy   . Anxiety   . Aortic insufficiency    mild... echo... 02/2010  . Atrial flutter ([place]) 02/03/2010   Consult by Dr. Lovena Le... June, 2011.... plan rate control...  Coumadin not needed... ablation if rate not controlled well  . Basal cell carcinoma   . Chest pain    Nuclear, March, 2010, no ischemia  . Degenerative joint disease   . Diastolic dysfunction    EF 50-55%... echo.. 02/2010 (55-60% echo 2009)  . Dyslipidemia   . Ejection fraction    EF 50-55%, echo, June, 2011  . Hemorrhoids   . HEMORRHOIDS 10/21/2007   Qualifier: History of  By: [name], [name]   . HTN (hypertension)   . Incomplete RBBB    IRBBB; sinus bradycardia w/ PVCs  . LVH (left ventricular hypertrophy)    moderate... echo... 02/2010  . Mitral regurgitation    mild... echo.. 02/2010  . PVC (premature ventricular contraction)   . Renal calculus   . Sinus bradycardia    Patient Active Problem List   Diagnosis Date Noted  . Parkinson's disease ([place]) 03/09/2016    Priority: High  . PAF (paroxysmal atrial fibrillation) ([place]) 02/03/2010    Priority: High  . Aortic insufficiency 07/20/2015    Priority: Medium  . Chest pain     Priority: Medium  . Essential hypertension     Priority: Medium  . Mitral regurgitation     Priority: Medium  . Diastolic dysfunction     Priority: Medium  . Dyslipidemia     Priority: Medium  . Allergy 12/07/2016    Priority: Low  . Sinus node dysfunction ([facility]) 08/02/2015    Priority: Low  . Hx of colonic polyps 01/13/2013    Priority: Low  . Incomplete RBBB     Priority: Low    . LVH (left ventricular hypertrophy)     Priority: Low  . Sinus bradycardia     Priority: Low  . PVC (premature ventricular contraction)     Priority: Low  . Allergic rhinitis 12/28/2009    Priority: Low  . History of basal cell carcinoma of skin 10/26/2008    Priority: Low  . Anxiety state 10/21/2007    Priority: Low  . Osteoarthritis 10/21/2007    Priority: Low  . RENAL CALCULUS, HX OF 10/20/2007    Priority: Low   Past Surgical History:  Procedure Laterality Date  . COLONOSCOPY    . KIDNEY STONE SURGERY    . POLYPECTOMY      Family History  Problem Relation Age of Onset  . Hypertension Mother   . Stroke Mother 3       lived to 64  . Other Father        parkinsons ? 84  . Colon cancer Paternal Aunt   . Colon cancer Paternal Uncle     Medications- reviewed and updated Current Outpatient Medications  Medication Sig Dispense Refill  . apixaban (ELIQUIS) 5 MG TABS tablet Take 1 tablet (5 mg total) by mouth 2 (two) times daily. 180 tablet 3  . atenolol (TENORMIN) 25 MG tablet TAKE 2 TABLETS BY MOUTH EVERY  DAY 180 tablet 0  . carbidopa-levodopa (SINEMET IR) 25-100 MG tablet Take 1 tablet by mouth 3 (three) times daily. 270 tablet 1  . felodipine (PLENDIL) 5 MG 24 hr tablet Take 1 tablet (5 mg total) by mouth daily. 90 tablet 3  . losartan-hydrochlorothiazide (HYZAAR) 100-12.5 MG tablet Take 0.5 tablets by mouth daily. 46 tablet 3  . losartan-hydrochlorothiazide (HYZAAR) 100-12.5 MG tablet TAKE 1 TABLET BY MOUTH DAILY 90 tablet 3  . Multiple Vitamin (MULTIVITAMIN) tablet Take 1 tablet by mouth daily.      . simvastatin (ZOCOR) 40 MG tablet Take 1 tablet (40 mg total) by mouth at bedtime. 90 tablet 3   Allergies-reviewed and updated No Known Allergies  Social History   Social History Narrative   Married- wife patient of Dr. [name]. Son and daughter in 15s in 2018. 79 27 year old grandchild.  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 Active with parkinsons exercise class- boxing and cycling    Objective: BP 108/72 (BP Location: Left Arm, Patient Position: Sitting, Cuff Size: Normal)   Pulse 68   Temp 97.6 F (36.4 C) (Oral)   Ht 6\' 1"  (1.854 m)   Wt 174 lb 12.8 oz (79.3 kg)   SpO2 96%   BMI 23.06 kg/m  Gen: NAD, resting comfortably HEENT: Mucous membranes are moist. Oropharynx normal Neck: no thyromegaly CV: RRR no murmurs rubs or gallops Lungs: CTAB no crackles, wheeze, rhonchi Abdomen: soft/nontender/nondistended/normal bowel sounds. No rebound or guarding.  Ext: no edema Skin: warm, dry Neuro: grossly normal, moves all extremities, PERRLA, tremor right hand noted, slow gait No rectal exam  Assessment/Plan:  77 y.o. male presenting for annual physical.  Health Maintenance counseling: 1. Anticipatory guidance: Patient counseled regarding regular dental exams -q6 months, eye exams -yearly, wearing seatbelts.  2. Risk factor reduction:  Advised patient of need for regular exercise and diet rich and fruits and vegetables to reduce risk of heart attack and stroke. Exercise- keeping up boxing and mowing grass. Diet-reasonable pretty balanced.  Wt Readings from Last 3 Encounters:  12/17/17 174 lb 12.8 oz (79.3 kg)  09/24/17 177 lb (80.3 kg)  09/24/17 177 lb 9.6 oz (80.6 kg)  3. Immunizations/screenings/ancillary studies-shingrix at pharmacy Immunization History  Administered Date(s) Administered  . Influenza Split 06/24/2011, 06/03/2012, 06/08/2013, 07/12/2014  . Influenza Whole 10/23/2009, 06/28/2010  . Influenza, High Dose Seasonal PF 07/09/2016, 06/20/2017  . Influenza-Unspecified 07/06/2015  . Pneumococcal Conjugate-13 07/19/2014  . Pneumococcal Polysaccharide-23 12/07/2016  . Tdap 09/03/2009, 01/02/2012  4. Prostate cancer screening-  no longer screening due to age 60. Colon cancer screening - 08/20/14  with 5 year follow up notes pre GI 6. Skin cancer screening- sees dermatology with history of basal cell skin cancer- still driving to [place]-advised regular sunscreen use. Denies worrisome, changing, or new skin lesions.  7. Never smoker  Status of chronic or acute concerns   Right shoulder pain- referred to Dr. Paulla Fore last visit - he ended up seeing a PT- this has now resolved  Atrial fibrillation- follows with Dr. Lovena Le. Rate controlled on atenolol. Eliquis for anticoagulation.   HLD_ very well controlled on simvastatin 40mg  with LDL 38 in october. No myalgias.   Parkinsons- continues to follow with Dr. Carles Collet. Enjoying his boxing and cycling classes. Walks 3 days a week. Also mowing lawn but somewhat flat. Sinemet  seems to be helping with his movement  Essential hypertension HTN- on atenolol 25mg , felodipine 5mg , half of hyzaar 100-12.5mg . Home BPs  Around 100-110. We will trial off felodipine 5mg - as long as blood pressure remains <140/90 at home   Future Appointments  Date Time Provider [place]  01/24/2018  9:15 AM Tat, Eustace Quail, DO [facility] None   Return in about 6 months (around 06/18/2018) for follow up- or sooner if needed.  Lab/Order associations: Preventative health care - Plan: CBC, Comprehensive metabolic panel, LDL cholesterol, direct  Essential hypertension - Plan: CBC, Comprehensive metabolic panel, LDL cholesterol, direct  Dyslipidemia - Plan: CBC, Comprehensive metabolic panel, LDL cholesterol, direct  Return precautions advised.  Garret Reddish, MD

## 2017-12-17 NOTE — Assessment & Plan Note (Signed)
HTN- on atenolol 25mg , felodipine 5mg , half of hyzaar 100-12.5mg . Home BPs  Around 100-110. We will trial off felodipine 5mg - as long as blood pressure remains <140/90 at home

## 2017-12-17 NOTE — Patient Instructions (Addendum)
Consider shingrix at your pharmacy- please let us know if you get this.   We will trial off felodipine 5mg - as long as blood pressure remains <140/90 at home. See me sooner if blood pressure not at goal at home  I would also like for you to sign up for an annual wellness visit with one of our nurses, Cassie or Manuela Schwartz, who both specialize in the annual wellness visit. This is a free benefit under medicare that may help [place] find additional ways to help you. Some highlights are reviewing medications, lifestyle, and doing a dementia screen.   Please stop by lab before you go

## 2017-12-18 ENCOUNTER — Telehealth: Payer: Self-pay

## 2017-12-18 NOTE — Telephone Encounter (Signed)
Called patient and spoke to patient wife, I gave her his lab results and she verbalized understanding.

## 2017-12-31 ENCOUNTER — Other Ambulatory Visit: Payer: Self-pay | Admitting: Pulmonary Disease

## 2018-01-02 ENCOUNTER — Telehealth: Payer: Self-pay | Admitting: Pulmonary Disease

## 2018-01-02 MED ORDER — ATENOLOL 25 MG PO TABS: 50.0000 mg | ORAL_TABLET | Freq: Every day | ORAL | 0 refills | Status: DC

## 2018-01-02 NOTE — Telephone Encounter (Signed)
Medication has been resent

## 2018-01-23 ENCOUNTER — Ambulatory Visit (INDEPENDENT_AMBULATORY_CARE_PROVIDER_SITE_OTHER): Payer: Medicare Other | Admitting: *Deleted

## 2018-01-23 ENCOUNTER — Encounter: Payer: Self-pay | Admitting: *Deleted

## 2018-01-23 VITALS — BP 128/80 | HR 88 | Ht 68.0 in | Wt 174.0 lb

## 2018-01-23 DIAGNOSIS — Z0000 Encounter for general adult medical examination without abnormal findings: Secondary | ICD-10-CM

## 2018-01-23 NOTE — Patient Instructions (Addendum)
Carlos [name] , Thank you for taking time to come for your Medicare Wellness Visit. I appreciate your ongoing commitment to your health goals. Please review the following plan we discussed and let me know if I can assist you in the future.    Will schedule an eye apt    Shingrix is a vaccine for the prevention of Shingles in Adults 50 and older.  If you are on Medicare, the shingrix is covered under your Part D plan, so you will take both of the vaccines in the series at your pharmacy. Please check with your benefits regarding applicable copays or out of pocket expenses.  The Shingrix is given in 2 vaccines approx 8 weeks apart. You must receive the 2nd dose prior to 6 months from receipt of the first. Please have the pharmacist print out you Immunization  dates for our office records    These are the goals we discussed: Goals    . Patient Stated     Maintain your aggressive exercise program!        This is a list of the screening recommended for you and due dates:  Health Maintenance  Topic Date Due  . Flu Shot  04/03/2018  . Colon Cancer Screening  08/21/2019  . Tetanus Vaccine  01/01/2022  . Pneumonia vaccines  Completed      Fall Prevention in the Home Falls can cause injuries. They can happen to people of all ages. There are many things you can do to make your home safe and to help prevent falls. What can I do on the outside of my home?  Regularly fix the edges of walkways and driveways and fix any cracks.  Remove anything that might make you trip as you walk through a door, such as a raised step or threshold.  Trim any bushes or trees on the path to your home.  Use bright outdoor lighting.  Clear any walking paths of anything that might make someone trip, such as rocks or tools.  Regularly check to see if handrails are loose or broken. Make sure that both sides of any steps have handrails.  Any raised decks and porches should have guardrails on the edges.  Have  any leaves, snow, or ice cleared regularly.  Use sand or salt on walking paths during winter.  Clean up any spills in your garage right away. This includes oil or grease spills. What can I do in the bathroom?  Use night lights.  Install grab bars by the toilet and in the tub and shower. Do not use towel bars as grab bars.  Use non-skid mats or decals in the tub or shower.  If you need to sit down in the shower, use a plastic, non-slip stool.  Keep the floor dry. Clean up any water that spills on the floor as soon as it happens.  Remove soap buildup in the tub or shower regularly.  Attach bath mats securely with double-sided non-slip rug tape.  Do not have throw rugs and other things on the floor that can make you trip. What can I do in the bedroom?  Use night lights.  Make sure that you have a light by your bed that is easy to reach.  Do not use any sheets or blankets that are too big for your bed. They should not hang down onto the floor.  Have a firm chair that has side arms. You can use this for support while you get dressed.  Do not  have throw rugs and other things on the floor that can make you trip. What can I do in the kitchen?  Clean up any spills right away.  Avoid walking on wet floors.  Keep items that you use a lot in easy-to-reach places.  If you need to reach something above you, use a strong step stool that has a grab bar.  Keep electrical cords out of the way.  Do not use floor polish or wax that makes floors slippery. If you must use wax, use non-skid floor wax.  Do not have throw rugs and other things on the floor that can make you trip. What can I do with my stairs?  Do not leave any items on the stairs.  Make sure that there are handrails on both sides of the stairs and use them. Fix handrails that are broken or loose. Make sure that handrails are as long as the stairways.  Check any carpeting to make sure that it is firmly attached to the  stairs. Fix any carpet that is loose or worn.  Avoid having throw rugs at the top or bottom of the stairs. If you do have throw rugs, attach them to the floor with carpet tape.  Make sure that you have a light switch at the top of the stairs and the bottom of the stairs. If you do not have them, ask someone to add them for you. What else can I do to help prevent falls?  Wear shoes that: ? Do not have high heels. ? Have rubber bottoms. ? Are comfortable and fit you well. ? Are closed at the toe. Do not wear sandals.  If you use a stepladder: ? Make sure that it is fully opened. Do not climb a closed stepladder. ? Make sure that both sides of the stepladder are locked into place. ? Ask someone to hold it for you, if possible.  Clearly mark and make sure that you can see: ? Any grab bars or handrails. ? First and last steps. ? Where the edge of each step is.  Use tools that help you move around (mobility aids) if they are needed. These include: ? Canes. ? Walkers. ? Scooters. ? Crutches.  Turn on the lights when you go into a dark area. Replace any light bulbs as soon as they burn out.  Set up your furniture so you have a clear path. Avoid moving your furniture around.  If any of your floors are uneven, fix them.  If there are any pets around you, be aware of where they are.  Review your medicines with your doctor. Some medicines can make you feel dizzy. This can increase your chance of falling. Ask your doctor what other things that you can do to help prevent falls. This information is not intended to replace advice given to you by your health care provider. Make sure you discuss any questions you have with your health care provider. Document Released: 06/16/2009 Document Revised: 01/26/2016 Document Reviewed: 09/24/2014 Elsevier Interactive Patient Education  2018 [place] Maintenance, Male A healthy lifestyle and preventive care is important for your health  and wellness. Ask your health care provider about what schedule of regular examinations is right for you. What should I know about weight and diet? Eat a Healthy Diet  Eat plenty of vegetables, fruits, whole grains, low-fat dairy products, and lean protein.  Do not eat a lot of foods high in solid fats, added sugars, or salt.  Maintain a Healthy Weight Regular exercise can help you achieve or maintain a healthy weight. You should:  Do at least 150 minutes of exercise each week. The exercise should increase your heart rate and make you sweat (moderate-intensity exercise).  Do strength-training exercises at least twice a week.  Watch Your Levels of Cholesterol and Blood Lipids  Have your blood tested for lipids and cholesterol every 5 years starting at 78 years of age. If you are at high risk for heart disease, you should start having your blood tested when you are 78 years old. You may need to have your cholesterol levels checked more often if: ? Your lipid or cholesterol levels are high. ? You are older than 78 years of age. ? You are at high risk for heart disease.  What should I know about cancer screening? Many types of cancers can be detected early and may often be prevented. Lung Cancer  You should be screened every year for lung cancer if: ? You are a current smoker who has smoked for at least 30 years. ? You are a former smoker who has quit within the past 15 years.  Talk to your health care provider about your screening options, when you should start screening, and how often you should be screened.  Colorectal Cancer  Routine colorectal cancer screening usually begins at 78 years of age and should be repeated every 5-10 years until you are 78 years old. You may need to be screened more often if early forms of precancerous polyps or small growths are found. Your health care provider may recommend screening at an earlier age if you have risk factors for colon cancer.  Your  health care provider may recommend using home test kits to check for hidden blood in the stool.  A small camera at the end of a tube can be used to examine your colon (sigmoidoscopy or colonoscopy). This checks for the earliest forms of colorectal cancer.  Prostate and Testicular Cancer  Depending on your age and overall health, your health care provider may do certain tests to screen for prostate and testicular cancer.  Talk to your health care provider about any symptoms or concerns you have about testicular or prostate cancer.  Skin Cancer  Check your skin from head to toe regularly.  Tell your health care provider about any new moles or changes in moles, especially if: ? There is a change in a mole's size, shape, or color. ? You have a mole that is larger than a pencil eraser.  Always use sunscreen. Apply sunscreen liberally and repeat throughout the day.  Protect yourself by wearing long sleeves, pants, a wide-brimmed hat, and sunglasses when outside.  What should I know about heart disease, diabetes, and high blood pressure?  If you are 63-32 years of age, have your blood pressure checked every 3-5 years. If you are 47 years of age or older, have your blood pressure checked every year. You should have your blood pressure measured twice-once when you are at a hospital or clinic, and once when you are not at a hospital or clinic. Record the average of the two measurements. To check your blood pressure when you are not at a hospital or clinic, you can use: ? An automated blood pressure machine at a pharmacy. ? A home blood pressure monitor.  Talk to your health care provider about your target blood pressure.  If you are between 48-31 years old, ask your health care provider if you  should take aspirin to prevent heart disease.  Have regular diabetes screenings by checking your fasting blood sugar level. ? If you are at a normal weight and have a low risk for diabetes, have this  test once every three years after the age of 75. ? If you are overweight and have a high risk for diabetes, consider being tested at a younger age or more often.  A one-time screening for abdominal aortic aneurysm (AAA) by ultrasound is recommended for men aged 50-75 years who are current or former smokers. What should I know about preventing infection? Hepatitis B If you have a higher risk for hepatitis B, you should be screened for this virus. Talk with your health care provider to find out if you are at risk for hepatitis B infection. Hepatitis C Blood testing is recommended for:  Everyone born from [birth year] through 1966.  Anyone with known risk factors for hepatitis C.  Sexually Transmitted Diseases (STDs)  You should be screened each year for STDs including gonorrhea and chlamydia if: ? You are sexually active and are younger than 78 years of age. ? You are older than 78 years of age and your health care provider tells you that you are at risk for this type of infection. ? Your sexual activity has changed since you were last screened and you are at an increased risk for chlamydia or gonorrhea. Ask your health care provider if you are at risk.  Talk with your health care provider about whether you are at high risk of being infected with HIV. Your health care provider may recommend a prescription medicine to help prevent HIV infection.  What else can I do?  Schedule regular health, dental, and eye exams.  Stay current with your vaccines (immunizations).  Do not use any tobacco products, such as cigarettes, chewing tobacco, and e-cigarettes. If you need help quitting, ask your health care provider.  Limit alcohol intake to no more than 2 drinks per day. One drink equals 12 ounces of beer, 5 ounces of wine, or 1 ounces of hard liquor.  Do not use street drugs.  Do not share needles.  Ask your health care provider for help if you need support or information about quitting  drugs.  Tell your health care provider if you often feel depressed.  Tell your health care provider if you have ever been abused or do not feel safe at home. This information is not intended to replace advice given to you by your health care provider. Make sure you discuss any questions you have with your health care provider. Document Released: 02/16/2008 Document Revised: 04/18/2016 Document Reviewed: 05/24/2015 Elsevier Interactive Patient Education  [employer].

## 2018-01-23 NOTE — Progress Notes (Signed)
Carlos [name] was seen today in the movement disorders clinic for neurologic consultation at the request of [name], MD.  This patient is accompanied in the office by his spouse who supplements the history. The consultation is for the evaluation of tremor.  Pt states that he noted stiffness in the arm about a year ago and then 6 months ago he noted tremor in the right arm (tremor noted in med records in 07/2015).  Wife notes tremor in the R arm when driving or when stressed in traffic.  She will note in when the hand is hanging loose or if hanging across her in bed.   08/09/16 update:  The patient follows up today, accompanied by his wife and son who supplement the history.  The patient has a new diagnosis of Parkinson's disease.  He got clearance from his cardiologist and pulmonologist to participate in Parkinson's related exercises.  The patient is now participating in rock steady boxing in [place] and loves it.  He goes 3 days a week and goes to [facility] cycle class one day a week.  His family has noticed an improvement in posture and ability to ambulate.  The patient feels great.  01/08/17 update:  Pt seen today in f/u, accompanied by his wife and daughter who supplement the history.  By his choice, he is on no PD meds.  He is exercising faithfully (boxing 3 days per week, [employer] cycle class).  He is doing well.  Pt denies falls.  Pt denies lightheadedness, near syncope.  No hallucinations.  Mood has been good.  Wife indicates more trouble tying his ties.  Also more trouble with handwriting.  Some concerns with memory by family.    05/14/17 update:  Pt in for PD f/u.  This patient is accompanied in the office by his family (wife, son, daughter) who supplements the history.  He has refused meds thus far.  Pt denies falls.  Pt denies lightheadedness, near syncope.  No hallucinations.  Mood has been good.  He continues to exercise regularly with RSB and he is cycling at home with RPM between 80-90.  He is even going to a boxing class in [place] when he visits his son.  he saw Dr. [name] for neuropscyhometric testing on 01/10/17 and subsequently had a feedback session with him where results and recommendations were given to him.  These are detailed within the chart.  This demonstrated MCI, but no dementia  09/24/17 update: Patient is seen today in follow-up for Parkinson's disease.  He is accompanied by his wife, son and daughter in law who supplements the history.  He is on no medication, by his choice.  Pt denies falls.  Pt denies lightheadedness, near syncope.  No hallucinations.  Mood has been good.  The records that were made available to me were reviewed.  Saw Dr. [name] in October.  He is doing RSB.  His shoulder is sore but he doesn't think that it is from boxing.  It has been "bothering" him for a few years per wife, and having more trouble rolling over in bed.  He has been massaging it with tennis ball and it has helped.  His nose waters all the time.  Daughter in law notes processing speed slow.  01/24/18 update: Patient is seen today in follow-up for Parkinson's disease.  He is accompanied by his wife and son who supplement the history.  We started carbidopa/levodopa 25/100, 1 tablet 3 times per day since our last  visit.  He reports that he feels it is helping.  Thinks memory/focus is better and it is easier to exercise.  Shoulder pain has resolved.  Pt denies falls.  Pt denies lightheadedness, near syncope.  No hallucinations.  Mood has been good.  He is exercising on the bike as well as the rock steady boxing.  The records that were made available to me were reviewed.  Patient saw his primary care physician, Dr. [name], on December 17, 2017.  His felodipine was discontinued.  He is still on Hyzaar and atenolol.  Asks about nose dripping.  Asks about drinking wine.  Wife reports that he was told by a nurse during some type of physical that he could drink up to 2 glasses of wine per day and she thinks  that the patient took this as an order to drink it and he went from drinking nothing to 1-2 glasses/day.  Wife asks about lawn mowing safety in heat.     ALLERGIES:  No Known Allergies  CURRENT MEDICATIONS:  Outpatient Encounter Medications as of 01/24/2018  Medication Sig  . apixaban (ELIQUIS) 5 MG TABS tablet Take 1 tablet (5 mg total) by mouth 2 (two) times daily.  Marland Kitchen atenolol (TENORMIN) 25 MG tablet Take 2 tablets (50 mg total) by mouth daily.  . carbidopa-levodopa (SINEMET IR) 25-100 MG tablet Take 1 tablet by mouth 3 (three) times daily.  Marland Kitchen losartan-hydrochlorothiazide (HYZAAR) 100-12.5 MG tablet Take 0.5 tablets by mouth daily.  . simvastatin (ZOCOR) 40 MG tablet Take 1 tablet (40 mg total) by mouth at bedtime.  . [DISCONTINUED] losartan-hydrochlorothiazide (HYZAAR) 100-12.5 MG tablet TAKE 1 TABLET BY MOUTH DAILY  . [DISCONTINUED] Multiple Vitamin (MULTIVITAMIN) tablet Take 1 tablet by mouth daily.     No facility-administered encounter medications on file as of 01/24/2018.     PAST MEDICAL HISTORY:   Past Medical History:  Diagnosis Date  . Allergic rhinitis   . Allergy   . Anxiety   . Aortic insufficiency    mild... echo... 02/2010  . Atrial flutter ([place]) 02/03/2010   Consult by Dr. Lovena Le... June, 2011.... plan rate control...  Coumadin not needed... ablation if rate not controlled well  . Basal cell carcinoma   . Chest pain    Nuclear, March, 2010, no ischemia  . Degenerative joint disease   . Diastolic dysfunction    EF 50-55%... echo.. 02/2010 (55-60% echo 2009)  . Dyslipidemia   . Ejection fraction    EF 50-55%, echo, June, 2011  . Hemorrhoids   . HEMORRHOIDS 10/21/2007   Qualifier: History of  By: [name], [name]   . HTN (hypertension)   . Incomplete RBBB    IRBBB; sinus bradycardia w/ PVCs  . LVH (left ventricular hypertrophy)    moderate... echo... 02/2010  . Mitral regurgitation    mild... echo.. 02/2010  . PVC (premature ventricular contraction)   . Renal  calculus   . Sinus bradycardia     PAST SURGICAL HISTORY:   Past Surgical History:  Procedure Laterality Date  . COLONOSCOPY    . KIDNEY STONE SURGERY    . POLYPECTOMY      SOCIAL HISTORY:   Social History   Socioeconomic History  . Marital status: Married    Spouse name: Jerard Bays  . Number of children: 2  . Years of education: Not on file  . Highest education level: Not on file  Occupational History  . Occupation: retired    Comment: Museum/gallery exhibitions officer  Needs  . Financial resource strain: Not on file  . Food insecurity:    Worry: Not on file    Inability: Not on file  . Transportation needs:    Medical: Not on file    Non-medical: Not on file  Tobacco Use  . Smoking status: Never Smoker  . Smokeless tobacco: Never Used  Substance and Sexual Activity  . Alcohol use: Yes    Alcohol/week: 0.0 - 0.6 oz    Comment: etoh; glass of wine one time a week   . Drug use: No  . Sexual activity: Not on file  Lifestyle  . Physical activity:    Days per week: Not on file    Minutes per session: Not on file  . Stress: Not on file  Relationships  . Social connections:    Talks on phone: Not on file    Gets together: Not on file    Attends religious service: Not on file    Active member of club or organization: Not on file    Attends meetings of clubs or organizations: Not on file    Relationship status: Not on file  . Intimate partner violence:    Fear of current or ex partner: Not on file    Emotionally abused: Not on file    Physically abused: Not on file    Forced sexual activity: Not on file  Other Topics Concern  . Not on file  Social History Narrative   Married- wife patient of Dr. [name]. Son and daughter in 64s in 2018. 42 64 year old grandchild.  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 Active with parkinsons exercise class-  boxing and cycling    FAMILY HISTORY:   Family Status  Relation Name Status  . Mother  Deceased       stroke  . Father  Deceased at age 42       "old age"  . Sister  Alive       healthy  . Sister  Alive       healthy  . Child  Alive       2, healthy  . [name]  (Not Specified)  . [name]  (Not Specified)    ROS:  A complete 10 system review of systems was obtained and was unremarkable apart from what is mentioned above.  PHYSICAL EXAMINATION:    VITALS:   Vitals:   01/24/18 0855  BP: 110/70  Pulse: 80  SpO2: 96%  Weight: 172 lb (78 kg)  Height: 6' (1.829 m)    Body mass index is 23.33 kg/m.   GEN:  The patient appears stated age and is in NAD. HEENT:  Normocephalic, atraumatic.  The mucous membranes are moist. The superficial temporal arteries are without ropiness or tenderness. CV:  irreg Lungs:  CTAB Neck/HEME:  There are no carotid bruits bilaterally.  Neurological examination:  Orientation: The patient is alert and oriented x3. Cranial nerves: There is good facial symmetry. The speech is fluent and clear. Soft palate rises symmetrically and there is no tongue deviation. Hearing is intact to conversational tone. Sensation: Sensation is intact to light touch throughout Motor: Strength is 5/5 in the bilateral upper and lower extremities but there is limited ability of R shoulder to internally rotate.   Shoulder shrug is equal and symmetric.  There is no pronator drift.   Movement examination: Tone: There is mild increased tone in the RUE.  Tone elsewhere is normal Abnormal movements: There is intermittent RUE resting tremor Coordination:  There is no decremation, with any form of RAMS, including alternating supination and pronation of the forearm, hand opening and closing, finger taps, heel taps and toe taps bilaterally Gait and Station: The patient has no difficulty arising out of a deep-seated chair without the use of the hands. The patient's stride  length is normal with decreased arm swing on the right.  Negative pull test  LABS    Chemistry      Component Value Date/Time   NA 141 12/17/2017 1020   K 4.3 12/17/2017 1020   CL 103 12/17/2017 1020   CO2 26 12/17/2017 1020   BUN 17 12/17/2017 1020   CREATININE 1.07 12/17/2017 1020      Component Value Date/Time   CALCIUM 9.7 12/17/2017 1020   ALKPHOS 59 12/17/2017 1020   AST 21 12/17/2017 1020   ALT 8 12/17/2017 1020   BILITOT 1.5 (H) 12/17/2017 1020     Lab Results  Component Value Date   WBC 5.3 12/17/2017   HGB 16.4 12/17/2017   HCT 47.9 12/17/2017   MCV 90.6 12/17/2017   PLT 128.0 (L) 12/17/2017   Lab Results  Component Value Date   TSH 1.12 02/06/2016   No results found for: VITAMINB12   ASSESSMENT/PLAN:  1.  Idiopathic Parkinson's disease.  He has Hoehn and Yoehr stage 1 disease.  -Congratulated the patient on his exercise, including rock steady boxing.  -continue carbidopa/levodopa 25/100, 1 po tid.  Starting this helped shoulder pain  -talked about limiting wine.  Went to some type of exam and told by nursing that he could drink 2 glasses wine per day.  He previously wasn't drinking any prior and now went to 1-2 glasses.  Told him to limit that as he is fall risk and also I would like him to preserve liver function.    -talked about PT.  Wife would like to try and get PT through the [place] [facility] but once we discussed interdisciplinary care approach, they would like to do this at [facility].  Will do PT/OT.  Will ask them to work with R shoulder as well (no pain but limited motion)  -pt asks about donating body to PD research.  Will look into this.    -lawn mowing safety discussed  2.  MCI  -had neurocognitive testing in 01/2017 with Dr. [name] and evidence of MCI without evidence of dementia.  -using lumoscity now daily  3.  HTN  -on atenolol and hyzaar.  May be on beta blocker for aflutter/afib.  Monitoring bp.  Felodipine now d/c  4.  Vasomotor  rhinorrhea  -asked me about this again today.  Not much to be done.  Discussed atropine gtts - he will hold on that for now.    5.  Follow up is anticipated in the next few months, sooner should new neurologic issues arise.  Much greater than 50% of this visit was spent in counseling and coordinating care.  Total face to face time:  30 min

## 2018-01-23 NOTE — Progress Notes (Signed)
I have reviewed and agree with note, evaluation, plan.   Anysa Tacey, MD  

## 2018-01-23 NOTE — Progress Notes (Signed)
Subjective:   Carlos [name] is a 77 y.o. male who presents for Medicare Annual/Subsequent preventive examination.  Reports health as good  Dx with parkinson's 2017 Started medication and it is helping  Is taking it 3 times a day ac  Diet Wife is great cook Does a lot of greens, vegetables. Avoids fats;  Wife concerned about weight maintenance  Discussed snacking throughout the day to maintain weight  The patient states he just stops eating when he is full   BMI 26   Regular exercise Boxing class is in [place] Now have opened one in [place] and [place] and coming to [employer] [facility] in [place]  [place] 2 days a week you bike 2 days a week he does boxing  States he tries to talk to his right hand and use it    Thinks meds has helped  Now taking the medicine with him when eating out   There are no preventive care reminders to display for this patient.    Educated regarding shingrix vaccines     Objective:    Vitals: BP 128/80   Pulse 88   Ht 5\' 8"  (1.727 m)   Wt 174 lb (78.9 kg)   SpO2 98%   BMI 26.46 kg/m   Body mass index is 26.46 kg/m.  Advanced Directives 01/23/2018 12/07/2016 08/06/2014  Does Patient Have a Medical Advance Directive? Yes No Yes  Type of Advance Directive - - [place];Living will    Tobacco Social History   Tobacco Use  Smoking Status Never Smoker  Smokeless Tobacco Never Used     Counseling given: Yes   Clinical Intake:     Past Medical History:  Diagnosis Date  . Allergic rhinitis   . Allergy   . Anxiety   . Aortic insufficiency    mild... echo... 02/2010  . Atrial flutter ([place]) 02/03/2010   Consult by Dr. Lovena [name]... June, 2011.... plan rate control...  Coumadin not needed... ablation if rate not controlled well  . Basal cell carcinoma   . Chest pain    Nuclear, March, 2010, no ischemia  . Degenerative joint disease   . Diastolic  dysfunction    EF 50-55%... echo.. 02/2010 (55-60% echo 2009)  . Dyslipidemia   . Ejection fraction    EF 50-55%, echo, June, 2011  . Hemorrhoids   . HEMORRHOIDS 10/21/2007   Qualifier: History of  By: Carlos Gilford MD, Carlos [name]   . HTN (hypertension)   . Incomplete RBBB    IRBBB; sinus bradycardia w/ PVCs  . LVH (left ventricular hypertrophy)    moderate... echo... 02/2010  . Mitral regurgitation    mild... echo.. 02/2010  . PVC (premature ventricular contraction)   . Renal calculus   . Sinus bradycardia    Past Surgical History:  Procedure Laterality Date  . COLONOSCOPY    . KIDNEY STONE SURGERY    . POLYPECTOMY     Family History  Problem Relation Age of Onset  . Hypertension Mother   . Stroke Mother 75       lived to 40  . Other Father        parkinsons ? 84  . Colon cancer Paternal Aunt   . Colon cancer Paternal Uncle    Social History   Socioeconomic History  . Marital status: Married    Spouse name: Carlos [name]  . Number of children: 2  .  Years of education: Not on file  . Highest education level: Not on file  Occupational History  . Occupation: retired    Comment: Scientist, clinical (histocompatibility and immunogenetics)  . Financial resource strain: Not on file  . Food insecurity:    Worry: Not on file    Inability: Not on file  . Transportation needs:    Medical: Not on file    Non-medical: Not on file  Tobacco Use  . Smoking status: Never Smoker  . Smokeless tobacco: Never Used  Substance and Sexual Activity  . Alcohol use: Yes    Alcohol/week: 0.0 - 0.6 oz    Comment: etoh; glass of wine one time a week   . Drug use: No  . Sexual activity: Not on file  Lifestyle  . Physical activity:    Days per week: Not on file    Minutes per session: Not on file  . Stress: Not on file  Relationships  . Social connections:    Talks on phone: Not on file    Gets together: Not on file    Attends religious service: Not on file    Active member of club or organization: Not on file     Attends meetings of clubs or organizations: Not on file    Relationship status: Not on file  Other Topics Concern  . Not on file  Social History Narrative   Married- wife patient of Dr. Yong [name]. Son and daughter in 83s in 2018. 34 48 year old grandchild.  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 Active with parkinsons exercise class- boxing and cycling    Outpatient Encounter Medications as of 01/23/2018  Medication Sig  . apixaban (ELIQUIS) 5 MG TABS tablet Take 1 tablet (5 mg total) by mouth 2 (two) times daily.  Marland Kitchen atenolol (TENORMIN) 25 MG tablet Take 2 tablets (50 mg total) by mouth daily.  . carbidopa-levodopa (SINEMET IR) 25-100 MG tablet Take 1 tablet by mouth 3 (three) times daily.  Marland Kitchen losartan-hydrochlorothiazide (HYZAAR) 100-12.5 MG tablet Take 0.5 tablets by mouth daily.  . Multiple Vitamin (MULTIVITAMIN) tablet Take 1 tablet by mouth daily.    . simvastatin (ZOCOR) 40 MG tablet Take 1 tablet (40 mg total) by mouth at bedtime.  . [DISCONTINUED] losartan-hydrochlorothiazide (HYZAAR) 100-12.5 MG tablet TAKE 1 TABLET BY MOUTH DAILY   No facility-administered encounter medications on file as of 01/23/2018.     Activities of Daily Living No flowsheet data found.  Patient Care Team: Carlos Olp, MD as PCP - General (Family Medicine)   Assessment:   This is a routine wellness examination for Carlos [name].  Exercise Activities and Dietary recommendations Boxing classes     Goals    . Patient Stated     Maintain your aggressive exercise program!        Fall Risk Fall Risk  12/17/2017 09/24/2017 05/14/2017 01/08/2017 12/07/2016  Falls in the past year? No No No No No     Depression Screen PHQ 2/9 Scores 12/17/2017 12/07/2016 12/07/2016 01/20/2014  PHQ - 2 Score 0 0 0 0    Cognitive Function MMSE - Mini Mental State Exam 05/14/2017 01/08/2017 12/07/2016  Not  completed: Unable to complete Unable to complete (No Data)   Dr. Carles [name] saw in January Noted he was seen for neuro-psychometric testing Assessment noted to have MCI; using  lumoscity   He gets "hung up" on words  Slow processing  Is very independent in self care He can still mow and take care of the yard  Wife tries to get more protein in him States he needs a little help but can still pick up things at the grocery store and run other errands      Immunization History  Administered Date(s) Administered  . Influenza Split 06/24/2011, 06/03/2012, 06/08/2013, 07/12/2014  . Influenza Whole 10/23/2009, 06/28/2010  . Influenza, High Dose Seasonal PF 07/09/2016, 06/20/2017  . Influenza-Unspecified 07/06/2015  . Pneumococcal Conjugate-13 07/19/2014  . Pneumococcal Polysaccharide-23 12/07/2016  . Tdap 09/03/2009, 01/02/2012      Screening Tests Health Maintenance  Topic Date Due  . INFLUENZA VACCINE  04/03/2018  . COLONOSCOPY  08/21/2019  . TETANUS/TDAP  01/01/2022  . PNA vac Low Risk Adult  Completed       Plan:      PCP Notes   Health Maintenance  There are no preventive care reminders to display for this patient. Comes with wife and seems happy with his current tx plan and progress. Very engaged in exercise  Discussed maintenance of weight   Educated regarding shingrix  Abnormal Screens  Neg; waived cognitive screening due to neurology following but did discuss his issues at length and is doing well for now. Good insight into his limitations but does not let that stop him from staying active and engaged. Wife very supportive.   Referrals  none  Patient concerns; none  Nurse Concerns; As noted  Next PCP apt 06/19/2018       I have personally reviewed and noted the following in the patient's chart:   . Medical and social history . Use of alcohol, tobacco or illicit drugs  . Current medications and supplements . Functional ability and  status . Nutritional status . Physical activity . Advanced directives . List of other physicians . Hospitalizations, surgeries, and ER visits in previous 12 months . Vitals . Screenings to include cognitive, depression, and falls . Referrals and appointments  In addition, I have reviewed and discussed with patient certain preventive protocols, quality metrics, and best practice recommendations. A written personalized care plan for preventive services as well as general preventive health recommendations were provided to patient.     Wynetta Fines, RN  01/23/2018

## 2018-01-24 ENCOUNTER — Encounter: Payer: Self-pay | Admitting: Neurology

## 2018-01-24 ENCOUNTER — Ambulatory Visit: Payer: Medicare Other | Admitting: Neurology

## 2018-01-24 VITALS — BP 110/70 | HR 80 | Ht 72.0 in | Wt 172.0 lb

## 2018-01-24 DIAGNOSIS — M25611 Stiffness of right shoulder, not elsewhere classified: Secondary | ICD-10-CM | POA: Diagnosis not present

## 2018-01-24 DIAGNOSIS — G20 Parkinson's disease: Secondary | ICD-10-CM | POA: Diagnosis not present

## 2018-01-24 DIAGNOSIS — J300 Vasomotor rhinitis: Secondary | ICD-10-CM | POA: Diagnosis not present

## 2018-01-24 DIAGNOSIS — I10 Essential (primary) hypertension: Secondary | ICD-10-CM | POA: Diagnosis not present

## 2018-01-24 MED ORDER — CARBIDOPA-LEVODOPA 25-100 MG PO TABS: 1.0000 | ORAL_TABLET | Freq: Three times a day (TID) | ORAL | 1 refills | Status: DC

## 2018-01-24 NOTE — Patient Instructions (Signed)
  [facility] for [employer] & Movement Disorders  The [facility] Movement Disorders team know that living well with a movement disorder extends far beyond our clinic walls. We are together with you. Our team is passionate about providing resources to you and your loved ones who are living with Parkinson's disease and movement disorders. Participate in these programs and join our community. These resources are free or low cost!   [facility] is adding:   Innovative educational programs for patients and caregivers.   Support groups for patients and caregivers living with Parkinson's disease.   Parkinson's specific exercise programs.   Custom tailored therapeutic programs that will benefit patient's living with Parkinson's disease.   We are in this together. You can help and contribute to grow these programs and resources in our community. 100% of the funds donated to the [facility] stays right here in our community to support patients and their caregivers.  To make a tax deductible contribution:  -ask for a [facility] for Parkinson's envelope in the office today.  - call the Office of Institutional Advancement at [phone].     Registration is OPEN!    Third Annual Parkinson's Education Symposium   To register: [email or web address]      Search:  [employer] person attending individually Questions: [place], [place] or [email or web address] [email or web address]    [facility] for Adults with Parkinson's Thursdays 9:30-10:30 am May 2, May 9, & May 16 & Tuesdays 10:00-11:00 am June 4, June 18, & July 2 [facility] [employer], [address] [place]. To register: [phone] or email [email or web address]

## 2018-01-24 NOTE — Addendum Note (Signed)
Addended byAnnamaria Helling on: 01/24/2018 09:53 AM   Modules accepted: Orders

## 2018-01-29 ENCOUNTER — Telehealth: Payer: Self-pay | Admitting: Neurology

## 2018-01-29 NOTE — Telephone Encounter (Signed)
Spoke with patient's wife. She wanted to clear up about her husband's wine consumption. Apparently he went to a class about blood thinners. They said in the class that you could have up to two glasses of wine daily while on blood thinners. He has not been drinking that much. He drinks maybe one glass of wine a week. She just wanted to clear this up.   Dr. Carles Collet -fyi.

## 2018-01-29 NOTE — Telephone Encounter (Signed)
Patient

## 2018-01-29 NOTE — Telephone Encounter (Signed)
Patient wife called and wants to talk to someone about the appt from Friday. I asked several times what it was in regards to and that all I could get from her

## 2018-01-30 NOTE — Telephone Encounter (Signed)
Thanks for the clarity.  I must have misunderstood, although the conversation about it was pretty lengthy.  Anyway, he can certainly have a glass of wine a week.

## 2018-04-17 DIAGNOSIS — L249 Irritant contact dermatitis, unspecified cause: Secondary | ICD-10-CM | POA: Diagnosis not present

## 2018-04-24 ENCOUNTER — Other Ambulatory Visit: Payer: Self-pay | Admitting: Neurology

## 2018-05-15 ENCOUNTER — Other Ambulatory Visit: Payer: Self-pay | Admitting: Pulmonary Disease

## 2018-05-22 ENCOUNTER — Encounter: Payer: Self-pay | Admitting: Psychology

## 2018-05-22 NOTE — Progress Notes (Signed)
Carlos [name] was seen today in the movement disorders clinic for neurologic consultation at the request of [name], MD.  This patient is accompanied in the office by his spouse who supplements the history. The consultation is for the evaluation of tremor.  Pt states that he noted stiffness in the arm about a year ago and then 6 months ago he noted tremor in the right arm (tremor noted in med records in 07/2015).  Wife notes tremor in the R arm when driving or when stressed in traffic.  She will note in when the hand is hanging loose or if hanging across her in bed.   08/09/16 update:  The patient follows up today, accompanied by his wife and son who supplement the history.  The patient has a new diagnosis of Parkinson's disease.  He got clearance from his cardiologist and pulmonologist to participate in Parkinson's related exercises.  The patient is now participating in rock steady boxing in [place] and loves it.  He goes 3 days a week and goes to [facility] cycle class one day a week.  His family has noticed an improvement in posture and ability to ambulate.  The patient feels great.  01/08/17 update:  Pt seen today in f/u, accompanied by his wife and daughter who supplement the history.  By his choice, he is on no PD meds.  He is exercising faithfully (boxing 3 days per week, [employer] cycle class).  He is doing well.  Pt denies falls.  Pt denies lightheadedness, near syncope.  No hallucinations.  Mood has been good.  Wife indicates more trouble tying his ties.  Also more trouble with handwriting.  Some concerns with memory by family.    05/14/17 update:  Pt in for PD f/u.  This patient is accompanied in the office by his family (wife, son, daughter) who supplements the history.  He has refused meds thus far.  Pt denies falls.  Pt denies lightheadedness, near syncope.  No hallucinations.  Mood has been good.  He continues to exercise regularly with RSB and he is cycling at home with RPM between 80-90.  He is even going to a boxing class in [place] when he visits his son.  he saw Dr. [name] for neuropscyhometric testing on 01/10/17 and subsequently had a feedback session with him where results and recommendations were given to him.  These are detailed within the chart.  This demonstrated MCI, but no dementia  09/24/17 update: Patient is seen today in follow-up for Parkinson's disease.  He is accompanied by his wife, son and daughter in law who supplements the history.  He is on no medication, by his choice.  Pt denies falls.  Pt denies lightheadedness, near syncope.  No hallucinations.  Mood has been good.  The records that were made available to me were reviewed.  Saw Dr. [name] in October.  He is doing RSB.  His shoulder is sore but he doesn't think that it is from boxing.  It has been "bothering" him for a few years per wife, and having more trouble rolling over in bed.  He has been massaging it with tennis ball and it has helped.  His nose waters all the time.  Daughter in law notes processing speed slow.  01/24/18 update: Patient is seen today in follow-up for Parkinson's disease.  He is accompanied by his wife and son who supplement the history.  We started carbidopa/levodopa 25/100, 1 tablet 3 times per day since our last  visit.  He reports that he feels it is helping.  Thinks memory/focus is better and it is easier to exercise.  Shoulder pain has resolved.  Pt denies falls.  Pt denies lightheadedness, near syncope.  No hallucinations.  Mood has been good.  He is exercising on the bike as well as the rock steady boxing.  The records that were made available to me were reviewed.  Patient saw his primary care physician, Dr. [name], on December 17, 2017.  His felodipine was discontinued.  He is still on Hyzaar and atenolol.  Asks about nose dripping.  Asks about drinking wine.  Wife reports that he was told by a nurse during some type of physical that he could drink up to 2 glasses of wine per day and she thinks  that the patient took this as an order to drink it and he went from drinking nothing to 1-2 glasses/day.  Wife asks about lawn mowing safety in heat.    05/23/18 update:  Pt f/u today for PD, accompanied by wife and daughter, who supplements the history.  The records that were made available to me were reviewed since last visit.  On carbidopa/levodopa 25/100 tid.  "I'm doing great.  I'm doing better than when I was 64."  Wife and daughter think biggest area of concern is cognition and they ask about repeating neurocog testing  Wearing off:  No.  How long before next dose:  n/a Falls:   No. N/V:  No. Hallucinations:  No.  visual distortions: No. Lightheaded:  No.  Syncope: No. Dyskinesia:  No.   ALLERGIES:  No Known Allergies  CURRENT MEDICATIONS:  Outpatient Encounter Medications as of 05/23/2018  Medication Sig  . apixaban (ELIQUIS) 5 MG TABS tablet Take 1 tablet (5 mg total) by mouth 2 (two) times daily.  Marland Kitchen atenolol (TENORMIN) 25 MG tablet TAKE 2 TABLETS BY MOUTH DAILY  . carbidopa-levodopa (SINEMET IR) 25-100 MG tablet Take 1 tablet by mouth 3 (three) times daily.  Marland Kitchen losartan-hydrochlorothiazide (HYZAAR) 100-12.5 MG tablet Take 0.5 tablets by mouth daily.  . simvastatin (ZOCOR) 40 MG tablet Take 1 tablet (40 mg total) by mouth at bedtime.  . [DISCONTINUED] carbidopa-levodopa (SINEMET IR) 25-100 MG tablet TAKE 1 TABLET BY MOUTH THREE TIMES DAILY   No facility-administered encounter medications on file as of 05/23/2018.     PAST MEDICAL HISTORY:   Past Medical History:  Diagnosis Date  . Allergic rhinitis   . Allergy   . Anxiety   . Aortic insufficiency    mild... echo... 02/2010  . Atrial flutter ([place]) 02/03/2010   Consult by Dr. Lovena Le... June, 2011.... plan rate control...  Coumadin not needed... ablation if rate not controlled well  . Basal cell carcinoma   . Chest pain    Nuclear, March, 2010, no ischemia  . Degenerative joint disease   . Diastolic dysfunction    EF  50-55%... echo.. 02/2010 (55-60% echo 2009)  . Dyslipidemia   . Ejection fraction    EF 50-55%, echo, June, 2011  . Hemorrhoids   . HEMORRHOIDS 10/21/2007   Qualifier: History of  By: [name], [name]   . HTN (hypertension)   . Incomplete RBBB    IRBBB; sinus bradycardia w/ PVCs  . LVH (left ventricular hypertrophy)    moderate... echo... 02/2010  . Mitral regurgitation    mild... echo.. 02/2010  . PVC (premature ventricular contraction)   . Renal calculus   . Sinus bradycardia     PAST SURGICAL  HISTORY:   Past Surgical History:  Procedure Laterality Date  . COLONOSCOPY    . KIDNEY STONE SURGERY    . POLYPECTOMY      SOCIAL HISTORY:   Social History   Socioeconomic History  . Marital status: Married    Spouse name: Tishawn Friedhoff  . Number of children: 2  . Years of education: Not on file  . Highest education level: Not on file  Occupational History  . Occupation: retired    Comment: Scientist, clinical (histocompatibility and immunogenetics)  . Financial resource strain: Not on file  . Food insecurity:    Worry: Not on file    Inability: Not on file  . Transportation needs:    Medical: Not on file    Non-medical: Not on file  Tobacco Use  . Smoking status: Never Smoker  . Smokeless tobacco: Never Used  Substance and Sexual Activity  . Alcohol use: Yes    Alcohol/week: 0.0 - 1.0 standard drinks    Comment: etoh; glass of wine one time a week   . Drug use: No  . Sexual activity: Not on file  Lifestyle  . Physical activity:    Days per week: Not on file    Minutes per session: Not on file  . Stress: Not on file  Relationships  . Social connections:    Talks on phone: Not on file    Gets together: Not on file    Attends religious service: Not on file    Active member of club or organization: Not on file    Attends meetings of clubs or organizations: Not on file    Relationship status: Not on file  . Intimate partner violence:    Fear of current or ex partner: Not on file     Emotionally abused: Not on file    Physically abused: Not on file    Forced sexual activity: Not on file  Other Topics Concern  . Not on file  Social History Narrative   Married- wife patient of Dr. [name]. Son and daughter in 66s in 2018. 56 75 year old grandchild.  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 Active with parkinsons exercise class- boxing and cycling    FAMILY HISTORY:   Family Status  Relation Name Status  . Mother  Deceased       stroke  . Father  Deceased at age 77       "old age"  . Sister  Alive       healthy  . Sister  Alive       healthy  . Child  Alive       2, healthy  . [name]  (Not Specified)  . [name]  (Not Specified)    ROS:  Review of Systems  Constitutional: Negative.   HENT: Negative.   Eyes: Negative.   Respiratory: Negative.   Cardiovascular: Negative.   Gastrointestinal: Negative.   Genitourinary: Negative.   Musculoskeletal: Negative.   Skin: Negative.      PHYSICAL EXAMINATION:    VITALS:   Vitals:   05/23/18 0924  BP: 124/64  Pulse: 82  SpO2: 95%  Weight: 171 lb (77.6 kg)  Height: 6\' 1"  (1.854 m)    Body mass index is 22.56 kg/m.   GEN:  The patient appears stated age and is  in NAD. HEENT:  Normocephalic, atraumatic.  The mucous membranes are moist. The superficial temporal arteries are without ropiness or tenderness. CV:  RRR Lungs:  CTAB Neck/HEME:  There are no carotid bruits bilaterally.  Neurological examination:  Orientation: The patient is alert and oriented x3. Cranial nerves: There is good facial symmetry. The speech is fluent and clear. Soft palate rises symmetrically and there is no tongue deviation. Hearing is intact to conversational tone. Sensation: Sensation is intact to light touch throughout Motor: Strength is 5/5 in the bilateral upper and lower extremities.    Shoulder shrug is equal and symmetric.  There is no pronator drift.  Movement examination: Tone: There is mild increased tone in the RUE.  Tone elsewhere is normal Abnormal movements: There is intermittent RUE resting tremor (same as previous) Coordination:  There is mild decremation with finger taps and toe taps on the right.  All other rapid alternating movements are good bilaterally.   Gait and Station: The patient has no difficulty arising out of a deep-seated chair without the use of the hands. The patient's stride length is normal with decreased arm swing on the right.  He is actually able to slowly jog down the hall and does well with that.  Negative pull test  LABS    Chemistry      Component Value Date/Time   NA 141 12/17/2017 1020   K 4.3 12/17/2017 1020   CL 103 12/17/2017 1020   CO2 26 12/17/2017 1020   BUN 17 12/17/2017 1020   CREATININE 1.07 12/17/2017 1020      Component Value Date/Time   CALCIUM 9.7 12/17/2017 1020   ALKPHOS 59 12/17/2017 1020   AST 21 12/17/2017 1020   ALT 8 12/17/2017 1020   BILITOT 1.5 (H) 12/17/2017 1020     Lab Results  Component Value Date   WBC 5.3 12/17/2017   HGB 16.4 12/17/2017   HCT 47.9 12/17/2017   MCV 90.6 12/17/2017   PLT 128.0 (L) 12/17/2017   Lab Results  Component Value Date   TSH 1.12 02/06/2016   No results found for: VITAMINB12   ASSESSMENT/PLAN:  1.  Idiopathic Parkinson's disease.  He has Hoehn and Yoehr stage 1 disease.  -Congratulated the patient on his exercise, including rock steady boxing.  -continue carbidopa/levodopa 25/100, 1 po tid.   -Invited him to the PARTS program.  -Discussed safety in and out of the home.  -discussed prognostic features  2.  MCI  -had neurocognitive testing in 01/2017 with Dr. [name] and evidence of MCI without evidence of dementia.  I still am not convinced of a dementia, but family has become more concerned and would like to repeat the testing.  That is reasonable.  -Has  stopped using luminosity faithfully.  Encouraged him to restart that, or other exercises that seem to challenge the brain.  He is doing frequent word searches.  3.  HTN  -on atenolol and hyzaar.  May be on beta blocker for aflutter/afib.  Monitoring bp.  Felodipine now d/c  4.  Vasomotor rhinorrhea  - Discussed atropine gtts - he will hold on that for now.    5.  Follow up is anticipated in the next few months, sooner should new neurologic issues arise.  Much greater than 50% of this visit was spent in counseling and coordinating care.  Total face to face time:  35 min

## 2018-05-22 NOTE — Progress Notes (Signed)
Patient is interested in donating his body to science. I placed some information in the mail on brain and body donation.

## 2018-05-23 ENCOUNTER — Encounter: Payer: Self-pay | Admitting: Neurology

## 2018-05-23 ENCOUNTER — Ambulatory Visit: Payer: Medicare Other | Admitting: Neurology

## 2018-05-23 VITALS — BP 124/64 | HR 82 | Ht 73.0 in | Wt 171.0 lb

## 2018-05-23 DIAGNOSIS — G20 Parkinson's disease: Secondary | ICD-10-CM | POA: Diagnosis not present

## 2018-05-23 DIAGNOSIS — G3184 Mild cognitive impairment, so stated: Secondary | ICD-10-CM | POA: Diagnosis not present

## 2018-05-23 NOTE — Patient Instructions (Addendum)
You have been referred for a neurocognitive evaluation in our office.   The evaluation has two parts.   . The first part of the evaluation is a clinical interview with the neuropsychologist (Dr. Macarthur Critchley). Please bring someone with you to this appointment if possible, as it is helpful for Dr. [name] to hear from both you and another adult who knows you well.   . The second part of the evaluation is testing with Dr. Marcia Brash technician Hinton Dyer). The testing includes a variety of tasks- mostly question-and-answer, some paper-and-pencil. There is nothing you need to do to prepare for this appointment, but having a good night's sleep prior to the testing, and bringing eyeglasses and hearing aids (if you wear them), is advised.   Please note: We have to reserve several hours of the neuropsychologist's time and the psychometrician's time for your evaluation appointment. As such, please note that there is a No-Show fee of $100. If you are unable to attend any of your appointments, please contact our office as soon as possible to reschedule.

## 2018-06-02 ENCOUNTER — Ambulatory Visit: Payer: Medicare Other | Admitting: Neurology

## 2018-06-19 ENCOUNTER — Encounter: Payer: Self-pay | Admitting: Family Medicine

## 2018-06-19 ENCOUNTER — Encounter: Payer: Self-pay | Admitting: Sports Medicine

## 2018-06-19 ENCOUNTER — Ambulatory Visit (INDEPENDENT_AMBULATORY_CARE_PROVIDER_SITE_OTHER): Payer: Medicare Other | Admitting: Family Medicine

## 2018-06-19 ENCOUNTER — Ambulatory Visit: Payer: Self-pay

## 2018-06-19 ENCOUNTER — Ambulatory Visit (INDEPENDENT_AMBULATORY_CARE_PROVIDER_SITE_OTHER): Payer: Medicare Other | Admitting: Sports Medicine

## 2018-06-19 VITALS — BP 124/84 | HR 81 | Temp 97.6°F | Resp 16 | Ht 73.0 in | Wt 170.0 lb

## 2018-06-19 VITALS — BP 124/84 | HR 81 | Ht 73.0 in | Wt 170.0 lb

## 2018-06-19 DIAGNOSIS — M25551 Pain in right hip: Secondary | ICD-10-CM

## 2018-06-19 DIAGNOSIS — I10 Essential (primary) hypertension: Secondary | ICD-10-CM

## 2018-06-19 DIAGNOSIS — I480 Paroxysmal atrial fibrillation: Secondary | ICD-10-CM

## 2018-06-19 DIAGNOSIS — G20 Parkinson's disease: Secondary | ICD-10-CM | POA: Diagnosis not present

## 2018-06-19 DIAGNOSIS — M1611 Unilateral primary osteoarthritis, right hip: Secondary | ICD-10-CM | POA: Diagnosis not present

## 2018-06-19 DIAGNOSIS — E785 Hyperlipidemia, unspecified: Secondary | ICD-10-CM | POA: Diagnosis not present

## 2018-06-19 NOTE — Assessment & Plan Note (Signed)
S: Patient is on atenolol 25 or 50 mg/day for rate control.  He remains on Eliquis twice a day for anticoagulation A/P: will continue eliquis. Will verify atenolol dose

## 2018-06-19 NOTE — Patient Instructions (Addendum)
Health Maintenance Due  Topic Date Due  . INFLUENZA VACCINE - thanks for doing this- our team will update record 04/03/2018   Please schedule a visit with our excellent sports medicine physician Dr. Paulla Fore before you leave at the check out desk so he can further evaluate your right hip pain  Verify atenolol dose and let us know- then we can send in 90 days with 3 refills.   Trial topicals like icy hot or biofreeze as needed on the thumb- just dont get in the eyes

## 2018-06-19 NOTE — Assessment & Plan Note (Signed)
S: controlled on atenolol 25 mg - 1 or 2 tablets/day, losartan-hydrochlorothiazide 100-12.5 mg-but only takes half tablet BP Readings from Last 3 Encounters:  06/19/18 124/84  05/23/18 124/64  01/24/18 110/70  A/P: We discussed blood pressure goal of <140/90. Continue current meds. He will verify whether taking 1 or 2 of atenolol 25 or 50mg  and let me know then I can send in under my name. Want to continue current dose as doing well

## 2018-06-19 NOTE — Assessment & Plan Note (Signed)
S:  controlled on simvastatin 40 mg with last LDL at 37 in April  A/P: Excellent control-continue current medication

## 2018-06-19 NOTE — Progress Notes (Signed)
Subjective:  Carlos [name] is a 77 y.o. year old very pleasant male patient who presents for/with See problem oriented charting ROS- No chest pain or shortness of breath. No headache or blurry vision.    Past Medical History-  Patient Active Problem List   Diagnosis Date Noted  . Parkinson's disease ([place]) 03/09/2016    Priority: High  . PAF (paroxysmal atrial fibrillation) ([place]) 02/03/2010    Priority: High  . Aortic insufficiency 07/20/2015    Priority: Medium  . Chest pain     Priority: Medium  . Essential hypertension     Priority: Medium  . Mitral regurgitation     Priority: Medium  . Diastolic dysfunction     Priority: Medium  . Dyslipidemia     Priority: Medium  . Allergy 12/07/2016    Priority: Low  . Sinus node dysfunction ([facility]) 08/02/2015    Priority: Low  . Hx of colonic polyps 01/13/2013    Priority: Low  . Incomplete RBBB     Priority: Low  . LVH (left ventricular hypertrophy)     Priority: Low  . Sinus bradycardia     Priority: Low  . PVC (premature ventricular contraction)     Priority: Low  . Allergic rhinitis 12/28/2009    Priority: Low  . History of basal cell carcinoma of skin 10/26/2008    Priority: Low  . Anxiety state 10/21/2007    Priority: Low  . Osteoarthritis 10/21/2007    Priority: Low  . RENAL CALCULUS, HX OF 10/20/2007    Priority: Low    Medications- reviewed and updated Current Outpatient Medications  Medication Sig Dispense Refill  . apixaban (ELIQUIS) 5 MG TABS tablet Take 1 tablet (5 mg total) by mouth 2 (two) times daily. 180 tablet 3  . atenolol (TENORMIN) 25 MG tablet TAKE 2 TABLETS BY MOUTH DAILY 180 tablet 0  . carbidopa-levodopa (SINEMET IR) 25-100 MG tablet Take 1 tablet by mouth 3 (three) times daily. 270 tablet 1  . losartan-hydrochlorothiazide (HYZAAR) 100-12.5 MG tablet Take 0.5 tablets by mouth daily. 46 tablet 3  . simvastatin (ZOCOR) 40 MG tablet Take 1 tablet (40 mg total) by mouth at bedtime. 90 tablet 3   No  current facility-administered medications for this visit.     Objective: BP 124/84   Pulse 81   Temp 97.6 F (36.4 C) (Oral)   Resp 16   Ht 6\' 1"  (1.854 m)   Wt 170 lb (77.1 kg)   SpO2 98%   BMI 22.43 kg/m  Gen: NAD, resting comfortably CV: RRR no murmurs rubs or gallops Lungs: CTAB no crackles, wheeze, rhonchi Abdomen: soft/nontender/nondistended/normal bowel sounds. No rebound or guarding.  Ext: no edema Skin: warm, dry Neuro: resting tremor noted Msk: unable to reproduce hip pain with ROM  Assessment/Plan:  Other notes: 1.  Continues to follow with neurology for Parkinson's 2. Walking. Enjoys mowing grass- does 3 different yards. Boxing 3 days a week.  3. Some pain MCP joint left hand- likely OA 4. Gets some pain in front of right trochanteric bursa- does not feel like over bursa. Has affected biking/walking- these have dropped down some. Going on for at least a year. Generally doesn't complain about pain. Refer to DR. Paulla Fore for further evaluation 5. hgb slightly high at [facility]- has been out working/perspiring so encouraged him to stay better hydrated. hgb was normal on last check.   Essential hypertension S: controlled on atenolol 25 mg - 1 or 2 tablets/day, losartan-hydrochlorothiazide 100-12.5 mg-but  only takes half tablet BP Readings from Last 3 Encounters:  06/19/18 124/84  05/23/18 124/64  01/24/18 110/70  A/P: We discussed blood pressure goal of <140/90. Continue current meds. He will verify whether taking 1 or 2 of atenolol 25 or 50mg  and let me know then I can send in under my name. Want to continue current dose as doing well  Dyslipidemia S:  controlled on simvastatin 40 mg with last LDL at 37 in April  A/P: Excellent control-continue current medication   PAF (paroxysmal atrial fibrillation) ([place]) S: Patient is on atenolol 25 or 50 mg/day for rate control.  He remains on Eliquis twice a day for anticoagulation A/P: will continue eliquis. Will verify atenolol  dose  Future Appointments  Date Time Provider [place]  10/02/2018  3:00 PM Tat, Eustace Quail, DO [facility] None  11/27/2018  1:00 PM Kandis Nab, PsyD [facility] None  11/27/2018  2:00 PM [facility] TE[facility] None  12/22/2018  9:40 AM [name], MD [facility]   6 months CPE  Lab/Order associations: Right hip pain - Plan: Ambulatory referral to Sports Medicine  Essential hypertension  Dyslipidemia  PAF (paroxysmal atrial fibrillation) ([place])  Return precautions advised.  Garret Reddish, MD

## 2018-06-19 NOTE — Progress Notes (Signed)
Carlos [name]. [place], Carlos [name] at [place] - 77 y.o. male MRN [id number]  Date of birth: 10/17/1940  Visit Date: 06/19/2018  PCP: [name], MD   Referred by: [name], MD   Scribe(s) for today's visit: Wendy Poet, LAT, ATC  SUBJECTIVE:  Carlos [name] is here for New Patient (Initial Visit) (R hip pain)  Referred by: Dr. [name]  HPI: His R hip pain symptoms INITIALLY: Began several months ago w/ no known MOI Described as mild naggin pain, nonradiating Worsened with squatting, walking w/ longer strides, stationary bike Improved with nothing noted Additional associated symptoms include: no R hip mechanical symptoms; no swelling noted and no N/T noted in the R LE    At this time symptoms show no change compared to onset  He has been using tylenol intermittently and limiting his exercise activities   REVIEW OF SYSTEMS: Denies night time disturbances. Denies fevers, chills, or night sweats. Denies unexplained weight loss. Denies personal history of cancer. Denies changes in bowel or bladder habits. Denies recent unreported falls. Denies new or worsening dyspnea or wheezing. Denies headaches or dizziness.  Denies numbness, tingling or weakness  In the extremities.  Denies dizziness or presyncopal episodes Denies lower extremity edema    HISTORY:  Prior history reviewed and updated per electronic medical record.  Social History   Occupational History  . 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 Smoking status: Never Smoker  . Smokeless tobacco: Never Used  Substance and Sexual Activity  . Alcohol use: Yes    Alcohol/week: 0.0 - 1.0 standard drinks    Comment: etoh; glass of wine one time a week   . Drug use: No  . Sexual activity: Not on file   Social History   Social History Narrative   Married- wife patient of Dr. [name]. Son and daughter in 45s  in 2018. 1 28 year old grandchild.  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 Active with parkinsons exercise class- boxing and cycling    Past Medical History:  Diagnosis Date  . Allergic rhinitis   . Allergy   . Anxiety   . Aortic insufficiency    mild... echo... 02/2010  . Atrial flutter ([place]) 02/03/2010   Consult by Dr. Lovena Le... June, 2011.... plan rate control...  Coumadin not needed... ablation if rate not controlled well  . Basal cell carcinoma   . Chest pain    Nuclear, March, 2010, no ischemia  . Degenerative joint disease   . Diastolic dysfunction    EF 50-55%... echo.. 02/2010 (55-60% echo 2009)  . Dyslipidemia   . Ejection fraction    EF 50-55%, echo, June, 2011  . Hemorrhoids   . HEMORRHOIDS 10/21/2007   Qualifier: History of  By: [name], [name]   . HTN (hypertension)   . Incomplete RBBB    IRBBB; sinus bradycardia w/ PVCs  . LVH (left ventricular hypertrophy)    moderate... echo... 02/2010  . Mitral regurgitation    mild... echo.. 02/2010  . PVC (premature ventricular contraction)   . Renal calculus   . Sinus bradycardia    Past Surgical History:  Procedure Laterality Date  . COLONOSCOPY    . KIDNEY STONE SURGERY    .  POLYPECTOMY     family history includes Colon cancer in his paternal aunt and paternal uncle; Hypertension in his mother; Other in his father; Stroke (age of onset: 49) in his mother.  DATA OBTAINED & REVIEWED:   Recent Labs    12/13/17  HGBA1C 5.6   . No X-Ray .   OBJECTIVE:  VS:  HT:6\' 1"  (185.4 cm)   WT:170 lb (77.1 kg)  BMI:22.43    BP:124/84  HR:81bpm  TEMP: ( )  RESP:98 %   PHYSICAL EXAM: CONSTITUTIONAL: Well-developed, Well-nourished and In no acute distress PSYCHIATRIC: Alert & appropriately interactive. and Not depressed or anxious appearing. RESPIRATORY: No increased work of breathing and  Trachea Midline EYES: Pupils are equal., EOM intact without nystagmus. and No scleral icterus.  VASCULAR EXAM: Warm and well perfused NEURO: pill rolling and masked facies but LE myotomal strength and dermatomal sensation intact  MSK Exam: Right Hips  Well aligned, no significant deformity. No overlying skin changes. No focal bony tenderness   RANGE OF MOTION & STRENGTH  Limited and painful: right hip in IR and ER   SPECIALITY TESTING:  + stinchfield test with limited but painfree logroll.  Pain with FABER and FADIR with marked limitations     ASSESSMENT   1. Right hip pain   2. Primary osteoarthritis of right hip   3. Parkinson's disease [facility])     PLAN:  Pertinent additional documentation may be included in corresponding procedure notes, imaging studies, problem based documentation and patient instructions.  Procedures:  . US Guided Injection per procedure note  Medications:  No orders of the defined types were placed in this encounter.  Discussion/Instructions: No problem-specific Assessment & Plan notes found for this encounter.  . Sx and US show moderate OA with effusion.  Should repond well to inj today.  Resume activities as tolerated . Discussed red flag symptoms that warrant earlier emergent evaluation and patient voices understanding. . Activity modifications and the importance of avoiding exacerbating activities (limiting pain to no more than a 4 / 10 during or following activity) recommended and discussed. Marland Kitchen RICE (Rest, ICE, Compression, Elevation) principles reviewed with the patient.  Follow-up:  . Return in about 6 months (around 12/19/2018).   . If any lack of improvement consider: further diagnostic evaluation with plain film X-ray of Right Hip  . At follow up will plan to consider: repeat corticosteroid injections     CMA/ATC served as scribe during this visit. History, Physical, and Plan performed by medical provider. Documentation and orders  reviewed and attested to.      Gerda Diss, [name]

## 2018-06-19 NOTE — Patient Instructions (Signed)

## 2018-06-19 NOTE — Procedures (Signed)
PROCEDURE NOTE:  Ultrasound Guided: Injection: Right hip Images were obtained and interpreted by myself, Teresa Coombs, DO  Images have been saved and stored to PACS system. Images obtained on: GE S7 Ultrasound machine    ULTRASOUND FINDINGS:  Moderate osteoarthritic spurring with small joint effusion.  DESCRIPTION OF PROCEDURE:  The patient's clinical condition is marked by substantial pain and/or significant functional disability. Other conservative therapy has not provided relief, is contraindicated, or not appropriate. There is a reasonable likelihood that injection will significantly improve the patient's pain and/or functional impairment.   After discussing the risks, benefits and expected outcomes of the injection and all questions were reviewed and answered, the patient wished to undergo the above named procedure.  Verbal consent was obtained.  The ultrasound was used to identify the target structure and adjacent neurovascular structures. The skin was then prepped in sterile fashion and the target structure was injected under direct visualization using sterile technique as below:  Single injection performed as below: PREP: Alcohol and Ethel Chloride APPROACH:direct, stopcock technique, 22g 1.5 in. INJECTATE: 5 cc 1% lidocaine, 2 cc 0.5% Marcaine and 2 cc 40mg /mL DepoMedrol ASPIRATE: None DRESSING: Band-Aid  Post procedural instructions including recommending icing and warning signs for infection were reviewed.    This procedure was well tolerated and there were no complications.   IMPRESSION: Succesful Ultrasound Guided: Injection

## 2018-06-20 ENCOUNTER — Telehealth: Payer: Self-pay | Admitting: *Deleted

## 2018-06-20 NOTE — Telephone Encounter (Signed)
Called to speak to patients wife and while speaking with her she wanted to clarify with Dr [name] that Mr Carlos [name]' prescription bottle for Atenolol does in fact read "2 tablets daily." However, the patient has only been taking 1 tablet daily for at least the last year. She would like Dr [name] to refill the medication under his name with it reading "1 tablet daily" as long as he agreed with that.  Per Dr Ronney Lion note from yesterday:  Essential hypertension S: controlled on atenolol 25 mg - 1 or 2 tablets/day, losartan-hydrochlorothiazide 100-12.5 mg-but only takes half tablet    BP Readings from Last 3 Encounters:  06/19/18 124/84  05/23/18 124/64  01/24/18 110/70  A/P: We discussed blood pressure goal of <140/90. Continue current meds. He will verify whether taking 1 or 2 of atenolol 25 or 50mg  and let me know then I can send in under my name. Want to continue current dose as doing well

## 2018-06-21 NOTE — Telephone Encounter (Signed)
May send in atenolol 25 mg for daily use #90 with 3 refills. Blood pressure and HR looked good on that regimen

## 2018-06-23 MED ORDER — ATENOLOL 25 MG PO TABS: 25.0000 mg | ORAL_TABLET | Freq: Every day | ORAL | 3 refills | Status: DC

## 2018-06-23 NOTE — Telephone Encounter (Signed)
Order placed

## 2018-07-03 DIAGNOSIS — L249 Irritant contact dermatitis, unspecified cause: Secondary | ICD-10-CM | POA: Diagnosis not present

## 2018-07-03 DIAGNOSIS — L570 Actinic keratosis: Secondary | ICD-10-CM | POA: Diagnosis not present

## 2018-07-16 ENCOUNTER — Telehealth: Payer: Self-pay | Admitting: Neurology

## 2018-07-16 NOTE — Telephone Encounter (Signed)
Dr Bailar is leaving our office to take a job closer to where she lives. We have mailed a letter to patient to let her know we have canceled all appointments with Bailar. Thank you for your understanding °

## 2018-07-24 ENCOUNTER — Other Ambulatory Visit: Payer: Self-pay | Admitting: Family Medicine

## 2018-08-12 ENCOUNTER — Telehealth: Payer: Self-pay | Admitting: Neurology

## 2018-08-12 NOTE — Telephone Encounter (Signed)
Spoke with patient's wife regarding letter that was mailed (Dr [name] leaving). She had spoken with her husband and also family about the letter. The patient is going to the [place] to have neurocognitive testing and was told it would last approx. 4 hours. Wife was very concerned if Dr. Carles Collet would be ok with it. She stated that her husband has an appt in January to see Dr Tat and will check then to see her thoughts on having the testing done at the [place].

## 2018-08-29 ENCOUNTER — Encounter: Payer: Self-pay | Admitting: Internal Medicine

## 2018-09-26 ENCOUNTER — Ambulatory Visit: Payer: Medicare Other | Admitting: Internal Medicine

## 2018-09-30 NOTE — Progress Notes (Signed)
Carlos [name] was seen today in the movement disorders clinic for neurologic consultation at the request of [name], MD.  This patient is accompanied in the office by his spouse who supplements the history. The consultation is for the evaluation of tremor.  Pt states that he noted stiffness in the arm about a year ago and then 6 months ago he noted tremor in the right arm (tremor noted in med records in 07/2015).  Wife notes tremor in the R arm when driving or when stressed in traffic.  She will note in when the hand is hanging loose or if hanging across her in bed.   08/09/16 update:  The patient follows up today, accompanied by his wife and son who supplement the history.  The patient has a new diagnosis of Parkinson's disease.  He got clearance from his cardiologist and pulmonologist to participate in Parkinson's related exercises.  The patient is now participating in rock steady boxing in [place] and loves it.  He goes 3 days a week and goes to [facility] cycle class one day a week.  His family has noticed an improvement in posture and ability to ambulate.  The patient feels great.  01/08/17 update:  Pt seen today in f/u, accompanied by his wife and daughter who supplement the history.  By his choice, he is on no PD meds.  He is exercising faithfully (boxing 3 days per week, [employer] cycle class).  He is doing well.  Pt denies falls.  Pt denies lightheadedness, near syncope.  No hallucinations.  Mood has been good.  Wife indicates more trouble tying his ties.  Also more trouble with handwriting.  Some concerns with memory by family.    05/14/17 update:  Pt in for PD f/u.  This patient is accompanied in the office by his family (wife, son, daughter) who supplements the history.  He has refused meds thus far.  Pt denies falls.  Pt denies lightheadedness, near syncope.  No hallucinations.  Mood has been good.  He continues to exercise regularly with [employer] and he is cycling at home with RPM between 80-90.  He is even going to a boxing class in [place] when he visits his son.  he saw Dr. [name] for neuropscyhometric testing on 01/10/17 and subsequently had a feedback session with him where results and recommendations were given to him.  These are detailed within the chart.  This demonstrated MCI, but no dementia  09/24/17 update: Patient is seen today in follow-up for Parkinson's disease.  He is accompanied by his wife, son and daughter in law who supplements the history.  He is on no medication, by his choice.  Pt denies falls.  Pt denies lightheadedness, near syncope.  No hallucinations.  Mood has been good.  The records that were made available to me were reviewed.  Saw Dr. [name] in October.  He is doing RSB.  His shoulder is sore but he doesn't think that it is from boxing.  It has been "bothering" him for a few years per wife, and having more trouble rolling over in bed.  He has been massaging it with tennis ball and it has helped.  His nose waters all the time.  Daughter in law notes processing speed slow.  01/24/18 update: Patient is seen today in follow-up for Parkinson's disease.  He is accompanied by his wife and son who supplement the history.  We started carbidopa/levodopa 25/100, 1 tablet 3 times per day since our last  visit.  He reports that he feels it is helping.  Thinks memory/focus is better and it is easier to exercise.  Shoulder pain has resolved.  Pt denies falls.  Pt denies lightheadedness, near syncope.  No hallucinations.  Mood has been good.  He is exercising on the bike as well as the rock steady boxing.  The records that were made available to me were reviewed.  Patient saw his primary care physician, Dr. [name], on December 17, 2017.  His felodipine was discontinued.  He is still on Hyzaar and atenolol.  Asks about nose dripping.  Asks about drinking wine.  Wife reports that he was told by a nurse during some type of physical that he could drink up to 2 glasses of wine per day and she thinks  that the patient took this as an order to drink it and he went from drinking nothing to 1-2 glasses/day.  Wife asks about lawn mowing safety in heat.    05/23/18 update:  Pt f/u today for PD, accompanied by wife and daughter, who supplements the history.  The records that were made available to me were reviewed since last visit.  On carbidopa/levodopa 25/100 tid.  "I'm doing great.  I'm doing better than when I was 78."  Wife and daughter think biggest area of concern is cognition and they ask about repeating neurocog testing  Wearing off:  No.  How long before next dose:  n/a Falls:   No. N/V:  No. Hallucinations:  No.  visual distortions: No. Lightheaded:  No.  Syncope: No. Dyskinesia:  No.  10/01/18 update: Patient is seen today in follow-up for Parkinson's, accompanied by his wife and daughter who supplement the history.  Records made available to me were reviewed since last visit.  He has seen his primary care as well as Dr. Paulla Fore for right hip pain. He received an injection and that helped.   Patient is on carbidopa/levodopa 25/100, 1 tablet 3 times per day.  He denies any falls.  Denies lightheadedness or near syncope.  Exercising 3 days a week at [employer].  He was scheduled to have neurocognitive testing with Dr. [name], but that had to be canceled because she was leaving.  They did call me and states that they are going to the [facility] to have that done.  He is scheduled in March.  Wife/daughter worry about increasing anxiety and they note some interfering with life.  ALLERGIES:  No Known Allergies  CURRENT MEDICATIONS:  Outpatient Encounter Medications as of 10/01/2018  Medication Sig  . apixaban (ELIQUIS) 5 MG TABS tablet Take 1 tablet (5 mg total) by mouth 2 (two) times daily.  Marland Kitchen atenolol (TENORMIN) 25 MG tablet Take 1 tablet (25 mg total) by mouth daily.  . carbidopa-levodopa (SINEMET IR) 25-100 MG tablet Take 1 tablet by mouth 3 (three) times daily.  Marland Kitchen  losartan-hydrochlorothiazide (HYZAAR) 100-12.5 MG tablet Take 0.5 tablets by mouth daily.  . simvastatin (ZOCOR) 40 MG tablet TAKE 1 TABLET BY MOUTH EVERY DAY AT BEDTIME   No facility-administered encounter medications on file as of 10/01/2018.     PAST MEDICAL HISTORY:   Past Medical History:  Diagnosis Date  . Allergic rhinitis   . Allergy   . Anxiety   . Aortic insufficiency    mild... echo... 02/2010  . Atrial flutter ([place]) 02/03/2010   Consult by Dr. Lovena Le... June, 2011.... plan rate control...  Coumadin not needed... ablation if rate not controlled well  . Basal cell  carcinoma   . Chest pain    Nuclear, March, 2010, no ischemia  . Degenerative joint disease   . Diastolic dysfunction    EF 50-55%... echo.. 02/2010 (55-60% echo 2009)  . Dyslipidemia   . Ejection fraction    EF 50-55%, echo, June, 2011  . Hemorrhoids   . HEMORRHOIDS 10/21/2007   Qualifier: History of  By: [name], [name]   . HTN (hypertension)   . Incomplete RBBB    IRBBB; sinus bradycardia w/ PVCs  . LVH (left ventricular hypertrophy)    moderate... echo... 02/2010  . Mitral regurgitation    mild... echo.. 02/2010  . PVC (premature ventricular contraction)   . Renal calculus   . Sinus bradycardia     PAST SURGICAL HISTORY:   Past Surgical History:  Procedure Laterality Date  . COLONOSCOPY    . KIDNEY STONE SURGERY    . POLYPECTOMY      SOCIAL HISTORY:   Social History   Socioeconomic History  . Marital status: Married    Spouse name: Kingson Lohmeyer  . Number of children: 2  . Years of education: Not on file  . Highest education level: Not on file  Occupational History  . Occupation: retired    Comment: Scientist, clinical (histocompatibility and immunogenetics)  . Financial resource strain: Not on file  . Food insecurity:    Worry: Not on file    Inability: Not on file  . Transportation needs:    Medical: Not on file    Non-medical: Not on file  Tobacco Use  . Smoking status: Never Smoker  . Smokeless tobacco:  Never Used  Substance and Sexual Activity  . Alcohol use: Yes    Alcohol/week: 0.0 - 1.0 standard drinks    Comment: etoh; glass of wine one time a week   . Drug use: No  . Sexual activity: Not on file  Lifestyle  . Physical activity:    Days per week: Not on file    Minutes per session: Not on file  . Stress: Not on file  Relationships  . Social connections:    Talks on phone: Not on file    Gets together: Not on file    Attends religious service: Not on file    Active member of club or organization: Not on file    Attends meetings of clubs or organizations: Not on file    Relationship status: Not on file  . Intimate partner violence:    Fear of current or ex partner: Not on file    Emotionally abused: Not on file    Physically abused: Not on file    Forced sexual activity: Not on file  Other Topics Concern  . Not on file  Social History Narrative   Married- wife patient of Dr. [name]. Son and daughter in 64s in 2018. 35 32 year old grandchild.  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 Active with parkinsons exercise class- boxing and cycling    FAMILY HISTORY:   Family Status  Relation Name Status  . Mother  Deceased       stroke  . Father  Deceased at age 79       "old age"  . Sister  Alive       healthy  . Sister  Alive  healthy  . Child  Alive       2, healthy  . [name]  (Not Specified)  . [name]  (Not Specified)  . MGM  Deceased  . MGF  Deceased  . PGM  Deceased  . PGF  Deceased    ROS:  ROS   PHYSICAL EXAMINATION:    VITALS:   Vitals:   10/01/18 1501 10/01/18 1519  BP: 120/80   Pulse: 100 80  SpO2: 98%   Weight: 172 lb 8 oz (78.2 kg)   Height: 6' (1.829 m)     Body mass index is 23.4 kg/m.  GEN:  The patient appears stated age and is in NAD. HEENT:  Normocephalic, atraumatic.  The mucous membranes are  moist. The superficial temporal arteries are without ropiness or tenderness. CV:  Irreg irreg.  Initially tachycardic but came down during the visit Lungs:  CTAB Neck/HEME:  There are no carotid bruits bilaterally.  Neurological examination:  Orientation: The patient is alert and oriented x3. Cranial nerves: There is good facial symmetry. The speech is fluent and clear. Soft palate rises symmetrically and there is no tongue deviation. Hearing is intact to conversational tone. Sensation: Sensation is intact to light touch throughout Motor: Strength is 5/5 in the bilateral upper and lower extremities.   Shoulder shrug is equal and symmetric.  There is no pronator drift.  Movement examination: Tone: There is mild increased tone in the RUE.  Tone elsewhere is normal Abnormal movements: There is mild RUE resting tremor Coordination:  There is no decremation, with any form of RAMS, including alternating supination and pronation of the forearm, hand opening and closing, finger taps, heel taps and toe taps bilaterally Gait and Station: The patient has no difficulty arising out of a deep-seated chair without the use of the hands. The patient's stride length is normal  LABS    Chemistry      Component Value Date/Time   NA 141 12/17/2017 1020   NA 141 12/13/2017   K 4.3 12/17/2017 1020   CL 103 12/17/2017 1020   CO2 26 12/17/2017 1020   BUN 17 12/17/2017 1020   CREATININE 1.07 12/17/2017 1020      Component Value Date/Time   CALCIUM 9.7 12/17/2017 1020   ALKPHOS 59 12/17/2017 1020   AST 21 12/17/2017 1020   ALT 8 12/17/2017 1020   BILITOT 1.5 (H) 12/17/2017 1020     Lab Results  Component Value Date   WBC 5.3 12/17/2017   HGB 16.4 12/17/2017   HCT 47.9 12/17/2017   MCV 90.6 12/17/2017   PLT 128.0 (L) 12/17/2017   Lab Results  Component Value Date   TSH 1.14 12/13/2017   No results found for: VITAMINB12   ASSESSMENT/PLAN:  1.  Idiopathic Parkinson's disease.  He has Hoehn  and Yoehr stage 1 disease.  -Congratulated the patient on his exercise, including rock steady boxing.  -continue carbidopa/levodopa 25/100, 1 po tid.   2.  MCI  -had neurocognitive testing in 01/2017 with Dr. [name] and evidence of MCI without evidence of dementia.  I still am not convinced of a dementia, but family has become more concerned and would like to repeat the testing.  They have scheduled that through the [facility] in march.  3.  HTN  -on atenolol and hyzaar.  May be on beta blocker for aflutter/afib.  Was initially tachycardic today, but that did normalize throughout the visit.  4.  Vasomotor rhinorrhea  - Discussed atropine  gtts - he will hold on that for now (asked about this again today)  -wife wants to try allegra.  I have no objection from a Parkinson's standpoint, but I also do not think it will help vasomotor rhinorrhea.  5.  GAD  -discussed meds or counseling/biofeedback/CBT.  They would like to try counseling and medication.  He was referred for counseling.  -We will start very low-dose Lexapro, 5 mg daily.  Discussed risk, benefits, and side effects.  May need a higher dose.  They will let me know if no benefit in 1 month.  6.  Follow up is anticipated in the next few months, sooner should new neurologic issues arise.  Much greater than 50% of this visit was spent in counseling and coordinating care.  Total face to face time:  40 min.  Patient/family had many questions and I answered them to the best of my ability.

## 2018-10-01 ENCOUNTER — Encounter: Payer: Self-pay | Admitting: Neurology

## 2018-10-01 ENCOUNTER — Ambulatory Visit: Payer: Medicare Other | Admitting: Neurology

## 2018-10-01 VITALS — BP 120/80 | HR 80 | Ht 72.0 in | Wt 172.5 lb

## 2018-10-01 DIAGNOSIS — R413 Other amnesia: Secondary | ICD-10-CM | POA: Diagnosis not present

## 2018-10-01 DIAGNOSIS — G3184 Mild cognitive impairment, so stated: Secondary | ICD-10-CM | POA: Diagnosis not present

## 2018-10-01 DIAGNOSIS — F411 Generalized anxiety disorder: Secondary | ICD-10-CM

## 2018-10-01 DIAGNOSIS — G20 Parkinson's disease: Secondary | ICD-10-CM | POA: Diagnosis not present

## 2018-10-01 MED ORDER — ESCITALOPRAM OXALATE 5 MG PO TABS: 5.0000 mg | ORAL_TABLET | Freq: Every day | ORAL | 1 refills | Status: DC

## 2018-10-01 MED ORDER — CARBIDOPA-LEVODOPA 25-100 MG PO TABS: 1.0000 | ORAL_TABLET | Freq: Three times a day (TID) | ORAL | 1 refills | Status: DC

## 2018-10-01 NOTE — Patient Instructions (Signed)
Start lexapro - 5 mg - 1 daily.  This is a very low dose.  Call me in a month if not helpful.

## 2018-10-02 ENCOUNTER — Ambulatory Visit: Payer: Medicare Other | Admitting: Neurology

## 2018-10-07 ENCOUNTER — Encounter: Payer: Self-pay | Admitting: Internal Medicine

## 2018-10-07 ENCOUNTER — Ambulatory Visit: Payer: Medicare Other | Admitting: Internal Medicine

## 2018-10-07 VITALS — BP 118/60 | HR 79 | Ht 72.0 in | Wt 172.0 lb

## 2018-10-07 DIAGNOSIS — I480 Paroxysmal atrial fibrillation: Secondary | ICD-10-CM

## 2018-10-07 DIAGNOSIS — I10 Essential (primary) hypertension: Secondary | ICD-10-CM

## 2018-10-07 DIAGNOSIS — I495 Sick sinus syndrome: Secondary | ICD-10-CM | POA: Diagnosis not present

## 2018-10-07 NOTE — Progress Notes (Signed)
HPI Mr. Bisch returns today for followup. He is a very pleasant 78 year old man with palpitations and documented paroxysmal atrial flutter as well as brief episodes of bradycardia. He has been nearly asymptomatic. He denies chest pain, shortness of breath, and syncope. He has retired from [employer]. He has been diagnosed with early Parkinson's disease. He has minimal tremor. He has minimal palpitations and is about to start neuro rehab which has gone well. He is boxing. He also walks regularly. His atrial fib has become persistent but he is minimally symptomatic. His VR is controlled.   No Known Allergies   Current Outpatient Medications  Medication Sig Dispense Refill  . apixaban (ELIQUIS) 5 MG TABS tablet Take 1 tablet (5 mg total) by mouth 2 (two) times daily. 180 tablet 3  . atenolol (TENORMIN) 25 MG tablet Take 1 tablet (25 mg total) by mouth daily. 90 tablet 3  . carbidopa-levodopa (SINEMET IR) 25-100 MG tablet Take 1 tablet by mouth 3 (three) times daily. 270 tablet 1  . escitalopram (LEXAPRO) 5 MG tablet Take 1 tablet (5 mg total) by mouth daily. 90 tablet 1  . losartan-hydrochlorothiazide (HYZAAR) 100-12.5 MG tablet Take 0.5 tablets by mouth daily. 46 tablet 3  . simvastatin (ZOCOR) 40 MG tablet TAKE 1 TABLET BY MOUTH EVERY DAY AT BEDTIME 90 tablet 1   No current facility-administered medications for this visit.      Past Medical History:  Diagnosis Date  . Allergic rhinitis   . Allergy   . Anxiety   . Aortic insufficiency    mild... echo... 02/2010  . Atrial flutter ([place]) 02/03/2010   Consult by Dr. Lovena Le... June, 2011.... plan rate control...  Coumadin not needed... ablation if rate not controlled well  . Basal cell carcinoma   . Chest pain    Nuclear, March, 2010, no ischemia  . Degenerative joint disease   . Diastolic dysfunction    EF 50-55%... echo.. 02/2010 (55-60% echo 2009)  . Dyslipidemia   . Ejection fraction    EF 50-55%, echo, June,  2011  . Hemorrhoids   . HEMORRHOIDS 10/21/2007   Qualifier: History of  By: [name], [name]   . HTN (hypertension)   . Incomplete RBBB    IRBBB; sinus bradycardia w/ PVCs  . LVH (left ventricular hypertrophy)    moderate... echo... 02/2010  . Mitral regurgitation    mild... echo.. 02/2010  . PVC (premature ventricular contraction)   . Renal calculus   . Sinus bradycardia     ROS:   All systems reviewed and negative except as noted in the HPI.   Past Surgical History:  Procedure Laterality Date  . COLONOSCOPY    . KIDNEY STONE SURGERY    . POLYPECTOMY       Family History  Problem Relation Age of Onset  . Hypertension Mother   . Stroke Mother 40       lived to 76  . Other Father        parkinsons ? 84  . Colon cancer Paternal Aunt   . Colon cancer Paternal Uncle      Social History   Socioeconomic History  . Marital status: Married    Spouse name: Avyon Herendeen  . Number of children: 2  . Years of education: Not on file  . Highest education level: Not on file  Occupational History  . Occupation: retired    Comment: Scientist, clinical (histocompatibility and immunogenetics)  . Emergency planning/management officer  strain: Not on file  . Food insecurity:    Worry: Not on file    Inability: Not on file  . Transportation needs:    Medical: Not on file    Non-medical: Not on file  Tobacco Use  . Smoking status: Never Smoker  . Smokeless tobacco: Never Used  Substance and Sexual Activity  . Alcohol use: Yes    Alcohol/week: 0.0 - 1.0 standard drinks    Comment: etoh; glass of wine one time a week   . Drug use: No  . Sexual activity: Not on file  Lifestyle  . Physical activity:    Days per week: Not on file    Minutes per session: Not on file  . Stress: Not on file  Relationships  . Social connections:    Talks on phone: Not on file    Gets together: Not on file    Attends religious service: Not on file    Active member of club or organization: Not on file    Attends meetings of clubs or  organizations: Not on file    Relationship status: Not on file  . Intimate partner violence:    Fear of current or ex partner: Not on file    Emotionally abused: Not on file    Physically abused: Not on file    Forced sexual activity: Not on file  Other Topics Concern  . Not on file  Social History Narrative   Married- wife patient of Dr. [name]. Son and daughter in 50s in 2018. 10 11 year old grandchild.  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 Active with parkinsons exercise class- boxing and cycling     BP 118/60   Pulse 79   Ht 6' (1.829 m)   Wt 172 lb (78 kg)   BMI 23.33 kg/m   Physical Exam:  Well appearing NAD HEENT: Unremarkable Neck:  No JVD, no thyromegally Lymphatics:  No adenopathy Back:  No CVA tenderness Lungs:  Clear with no wheezes HEART:  IRegular rate rhythm, 1/6 systolic murmur, no rubs, no clicks Abd:  soft, positive bowel sounds, no organomegally, no rebound, no guarding Ext:  2 plus pulses, no edema, no cyanosis, no clubbing Skin:  No rashes no nodules Neuro:  CN II through XII intact, motor grossly intact, right arm with a resting tremor  EKG - atrial fib with a controlled VR   Assess/Plan: 1. Atrial fib - he is now in atrial fib 100% of the time. He will continue a strategy of rate control. 2. coags - he has had no bleeding on Eliquis. 3. HTN - his blood pressure today is well controlled.  4. MR - he is asymptomatic and his murmur is soft. No additional therapy at this time.  Mikle Bosworth.D.

## 2018-10-07 NOTE — Patient Instructions (Signed)

## 2018-10-23 ENCOUNTER — Ambulatory Visit: Payer: Medicare Other | Admitting: Psychology

## 2018-10-23 DIAGNOSIS — F064 Anxiety disorder due to known physiological condition: Secondary | ICD-10-CM | POA: Diagnosis not present

## 2018-11-04 ENCOUNTER — Ambulatory Visit: Payer: Medicare Other | Admitting: Psychology

## 2018-11-04 ENCOUNTER — Encounter: Payer: Medicare Other | Admitting: Psychology

## 2018-11-04 DIAGNOSIS — F064 Anxiety disorder due to known physiological condition: Secondary | ICD-10-CM | POA: Diagnosis not present

## 2018-11-18 ENCOUNTER — Ambulatory Visit: Payer: Medicare Other | Admitting: Psychology

## 2018-11-20 DIAGNOSIS — Z08 Encounter for follow-up examination after completed treatment for malignant neoplasm: Secondary | ICD-10-CM | POA: Diagnosis not present

## 2018-11-20 DIAGNOSIS — L570 Actinic keratosis: Secondary | ICD-10-CM | POA: Diagnosis not present

## 2018-11-20 DIAGNOSIS — L820 Inflamed seborrheic keratosis: Secondary | ICD-10-CM | POA: Diagnosis not present

## 2018-11-20 DIAGNOSIS — Z85828 Personal history of other malignant neoplasm of skin: Secondary | ICD-10-CM | POA: Diagnosis not present

## 2018-11-27 ENCOUNTER — Encounter: Payer: Medicare Other | Admitting: Psychology

## 2018-12-02 ENCOUNTER — Ambulatory Visit: Payer: Medicare Other | Admitting: Psychology

## 2018-12-22 ENCOUNTER — Ambulatory Visit: Payer: Medicare Other | Admitting: Sports Medicine

## 2018-12-22 ENCOUNTER — Encounter: Payer: Self-pay | Admitting: Family Medicine

## 2018-12-22 ENCOUNTER — Ambulatory Visit (INDEPENDENT_AMBULATORY_CARE_PROVIDER_SITE_OTHER): Payer: Medicare Other | Admitting: Family Medicine

## 2018-12-22 VITALS — BP 118/76 | HR 70 | Temp 97.0°F | Wt 173.8 lb

## 2018-12-22 DIAGNOSIS — I10 Essential (primary) hypertension: Secondary | ICD-10-CM | POA: Diagnosis not present

## 2018-12-22 DIAGNOSIS — I480 Paroxysmal atrial fibrillation: Secondary | ICD-10-CM | POA: Diagnosis not present

## 2018-12-22 DIAGNOSIS — E785 Hyperlipidemia, unspecified: Secondary | ICD-10-CM

## 2018-12-22 MED ORDER — SIMVASTATIN 40 MG PO TABS: 40.0000 mg | ORAL_TABLET | Freq: Every day | ORAL | 3 refills | Status: DC

## 2018-12-22 NOTE — Patient Instructions (Signed)
Need to update PHQ 2 at next follow-up  Video visit

## 2018-12-22 NOTE — Progress Notes (Signed)
Phone [phone]   Subjective:  Virtual visit via Video note. Chief complaint: Chief Complaint  Patient presents with  . Hypertension   This visit type was conducted due to national recommendations for restrictions regarding the COVID-19 Pandemic (e.g. social distancing).  This format is felt to be most appropriate for this patient at this time balancing risks to patient and risks to population by having him in for in person visit.  No physical exam was performed (except for noted visual exam or audio findings with Telehealth visits).    Our team/I connected with Joyice Faster Cotten on 12/22/18 at  9:40 AM EDT by a video enabled telemedicine application ([email or web address]) and verified that I am speaking with the correct person using two identifiers.  Location patient: Home-[place] Location provider: [facility], office Persons participating in the virtual visit:  patient  Our team/I discussed the limitations of evaluation and management by telemedicine and the availability of in person appointments. In light of current covid-19 pandemic, patient also understands that we are trying to protect them by minimizing in office contact if at all possible.  The patient expressed consent for telemedicine visit and agreed to proceed. Patient understands insurance will be billed.   ROS- No chest pain or shortness of breath. No headache or blurry vision.  Anxiety reasonably controlled   Past Medical History-  Patient Active Problem List   Diagnosis Date Noted  . Parkinson's disease ([place]) 03/09/2016    Priority: High  . PAF (paroxysmal atrial fibrillation) ([place]) 02/03/2010    Priority: High  . Aortic insufficiency 07/20/2015    Priority: Medium  . Chest pain     Priority: Medium  . Essential hypertension     Priority: Medium  . Mitral regurgitation     Priority: Medium  . Diastolic dysfunction     Priority: Medium  . Dyslipidemia     Priority: Medium  . Allergy 12/07/2016    Priority: Low  . Sinus node  dysfunction ([facility]) 08/02/2015    Priority: Low  . Hx of colonic polyps 01/13/2013    Priority: Low  . Incomplete RBBB     Priority: Low  . LVH (left ventricular hypertrophy)     Priority: Low  . Sinus bradycardia     Priority: Low  . PVC (premature ventricular contraction)     Priority: Low  . Allergic rhinitis 12/28/2009    Priority: Low  . History of basal cell carcinoma of skin 10/26/2008    Priority: Low  . Anxiety state 10/21/2007    Priority: Low  . Osteoarthritis 10/21/2007    Priority: Low  . RENAL CALCULUS, HX OF 10/20/2007    Priority: Low  . Primary osteoarthritis of right hip 06/19/2018    Medications- reviewed and updated Current Outpatient Medications  Medication Sig Dispense Refill  . apixaban (ELIQUIS) 5 MG TABS tablet Take 1 tablet (5 mg total) by mouth 2 (two) times daily. 180 tablet 3  . atenolol (TENORMIN) 25 MG tablet Take 1 tablet (25 mg total) by mouth daily. 90 tablet 3  . carbidopa-levodopa (SINEMET IR) 25-100 MG tablet Take 1 tablet by mouth 3 (three) times daily. 270 tablet 1  . escitalopram (LEXAPRO) 5 MG tablet Take 1 tablet (5 mg total) by mouth daily. 90 tablet 1  . losartan-hydrochlorothiazide (HYZAAR) 100-12.5 MG tablet Take 0.5 tablets by mouth daily. 46 tablet 3  . simvastatin (ZOCOR) 40 MG tablet Take 1 tablet (40 mg total) by mouth at bedtime. 90 tablet 3   No  current facility-administered medications for this visit.      Objective:  BP 118/76   Pulse 70   Temp (!) 97 F (36.1 C)   Wt 173 lb 12.8 oz (78.8 kg)   BMI 23.57 kg/m  Gen: NAD, resting comfortably Lungs: nonlabored, normal respiratory rate  Skin: appears dry, no obvious rash    Assessment and Plan   #Atrial fibrillation S: Patient is anticoagulated with Eliquis.  Also on atenolol for rate control.  He is asymptomatic A/P: Doing well-continue current medications  #hypertension S: controlled on atenolol 25 mg as well as losartan hydrochlorothiazide 100-12.5 mg but  only takes 1/2 tablet BP Readings from Last 3 Encounters:  12/22/18 118/76  10/07/18 118/60  10/01/18 120/80  A/P:  Stable. Continue current medications.    #hyperlipidemia/dyslipidemia S: Has been controlled on simvastatin 40 mg-compliant with medication Lab Results  Component Value Date   CHOL 106 12/13/2017   HDL 62 12/13/2017   LDLCALC 25 12/13/2017   LDLDIRECT 37.0 12/17/2017   TRIG 75 12/13/2017   CHOLHDL 2 06/20/2017   A/P: Suspect stable problem-he is due for repeat lipid panel but due to COVID-19 pandemic we opted to push labs out for 3 to 36-month in person visit hopefully.   Other notes: 1.walking at least a mile a day. Still doing online boxing program- rocksteady boxing. Doing some dancing. Still mowing his grass.  2. With covid 19- doing some more reading and watching birds (has lots of birdfeeders) 3. Anxiety up with parkinsons- lexapro helping. phq2 of 0.   3-5 month follow up recommended- will not be yet due for CPE Future Appointments  Date Time Provider [place]  02/05/2019 10:45 AM Tat, Eustace Quail, DO [facility] None   Lab/Order associations: PAF (paroxysmal atrial fibrillation) (HCC)  Essential hypertension  Dyslipidemia  Meds ordered this encounter  Medications  . simvastatin (ZOCOR) 40 MG tablet    Sig: Take 1 tablet (40 mg total) by mouth at bedtime.    Dispense:  90 tablet    Refill:  3    Return precautions advised.  Garret Reddish, MD

## 2019-01-07 ENCOUNTER — Other Ambulatory Visit: Payer: Self-pay

## 2019-01-08 NOTE — Progress Notes (Signed)
Virtual Visit via Video Note  The purpose of this virtual visit is to provide medical care while limiting exposure to the novel coronavirus.    Consent was obtained for video visit:  Yes.   Answered questions that patient had about telehealth interaction:  Yes.   I discussed the limitations, risks, security and privacy concerns of performing an evaluation and management service by telemedicine. I also discussed with the patient that there may be a patient responsible charge related to this service. The patient expressed understanding and agreed to proceed.  Pt location: Home Physician Location: home Name of referring provider:  [name], MD I connected with Joyice Faster Aldridge at patients initiation/request on 01/09/2019 at  2:00 PM EDT by video enabled telemedicine application and verified that I am speaking with the correct person using two identifiers. Pt MRN:  [id number] Pt DOB:  Oct 20, 1940 Video Participants:  [name];  [name] (wife); Vicente Males (daughter)   History of Present Illness:  Patient is seen today in follow-up for Parkinson's disease.  He is on carbidopa/levodopa 25/100, 1 tablet 3 times per day.  He has had no falls since last visit.  No lightheadedness or near syncope.  He was started on Lexapro last visit, 5 mg daily.  He reports that "I am doing well."  Wife thinks that he is dreaming more because she will hear him mumbling.    Last visit, patient stated that he was scheduled to have neurocognitive testing through the [facility].  He states that it was cancelled due to the pandemic and was r/s for august.  He is doing online RSB.  Daughter is getting the groceries.     Observations/Objective:   Vitals:   01/09/19 0956  BP: 110/76  Pulse: 60  Temp: (!) 97 F (36.1 C)  SpO2: 98%  Weight: 170 lb (77.1 kg)  Height: 6\' 1"  (1.854 m)   GEN:  The patient appears stated age and is in NAD.  Neurological examination:  Orientation:  Montreal Cognitive Assessment   01/09/2019  Visuospatial/ Executive (0/5) 1  Naming (0/3) 3  Attention: Read list of digits (0/2) 0  Attention: Read list of letters (0/1) 0  Attention: Serial 7 subtraction starting at 100 (0/3) 2  Language: Repeat phrase (0/2) 0  Language : Fluency (0/1) 0  Abstraction (0/2) 1  Delayed Recall (0/5) 0  Orientation (0/6) 3  Total 10   Cranial nerves: There is good facial symmetry. There is mild facial hypomimia.  The speech is fluent and clear.  He is hypophonic.  Soft palate rises symmetrically and there is no tongue deviation. Hearing is intact to conversational tone. Motor: Strength is at least antigravity x 4.   Shoulder shrug is equal and symmetric.  There is no pronator drift.  Movement examination: Tone: unable Abnormal movements: Mild right upper extremity rest tremor that intermittently becomes moderate Coordination:  There is no decremation with RAM's, with hand opening and closing or finger taps bilaterally. Gait and Station: The patient is on a very soft couch, so he does take a few times to be able to arise without his hands, but he is able to do so.  He walks well in his home.  He has resting tremor on the right with ambulation.    Assessment and Plan:   1.  Idiopathic Parkinson's disease.  He has Hoehn and Yoehr stage 1 disease.             -Congratulated the patient  on his exercise, including rock steady boxing.  He is doing this on an online platform right now, and I encouraged him to continue to do that.             -continue carbidopa/levodopa 25/100, 1 po tid.   -Discussed in detail Covid-19 and risk factors for this, including age and PD.  Discussed importance of social distancing.  Discussed importance of staying home at all times, as is feasible.  Discussed taking advantage of grocery store hours for the elderly.  Daughter is currently getting all of the groceries.  Pt expressed understanding.  2.  MCI             -had neurocognitive testing in 01/2017 with Dr.  [name] and evidence of MCI without evidence of dementia.    His Moca had significantly deteriorated and I am much more concerned for a neurodegenerative process now. They have scheduled that through the [facility] and is r/s for august.  They would like a copy of the old testing for the [place] neuropsychologist and I told them to have the [place] contact us and we can send a copy directly to the neuropsychologist.  3.  HTN             -on atenolol and hyzaar.  May be on beta blocker for aflutter/afib.   4.  Vasomotor rhinorrhea             - Discussed atropine gtts - he will hold on that for now               5.  GAD             -discussed meds or counseling/biofeedback/CBT.    -They believe Lexapro has helped.  Wife thought it perhaps it caused some dreaming (patient does not notice it (as she has noticed mumbling during the sleep.  I did tell her that drinking can be associated with Parkinson's disease, but I doubt Lexapro has made that worse.  Follow Up Instructions:  Follow-up in 5 months, sooner should new neurologic issues arise.  -I discussed the assessment and treatment plan with the patient. The patient was provided an opportunity to ask questions and all were answered. The patient agreed with the plan and demonstrated an understanding of the instructions.   The patient was advised to call back or seek an in-person evaluation if the symptoms worsen or if the condition fails to improve as anticipated.    Total Time spent in visit with the patient was: 25 minutes, of which more than 50% of the time was spent in counseling and/or coordinating care on safety.   Pt understands and agrees with the plan of care outlined.     Alonza Bogus, DO

## 2019-01-09 ENCOUNTER — Telehealth: Payer: Self-pay | Admitting: Neurology

## 2019-01-09 ENCOUNTER — Telehealth (INDEPENDENT_AMBULATORY_CARE_PROVIDER_SITE_OTHER): Payer: Medicare Other | Admitting: Neurology

## 2019-01-09 ENCOUNTER — Ambulatory Visit (INDEPENDENT_AMBULATORY_CARE_PROVIDER_SITE_OTHER): Payer: Medicare Other | Admitting: Psychology

## 2019-01-09 ENCOUNTER — Other Ambulatory Visit: Payer: Self-pay

## 2019-01-09 ENCOUNTER — Encounter: Payer: Self-pay | Admitting: Neurology

## 2019-01-09 DIAGNOSIS — G3184 Mild cognitive impairment, so stated: Secondary | ICD-10-CM

## 2019-01-09 DIAGNOSIS — F064 Anxiety disorder due to known physiological condition: Secondary | ICD-10-CM

## 2019-01-09 DIAGNOSIS — G20 Parkinson's disease: Secondary | ICD-10-CM | POA: Diagnosis not present

## 2019-01-09 NOTE — Telephone Encounter (Signed)
Daughter asked what MoCA was at visit but wasn't recorded yet by staff so told them I would let them know.  Tell pt/wife that it showed memory change and more than I think that I expected and we will see what his neurocognitive testing at [place] shows in august and will address after we get those results since those are more detailed and exact.

## 2019-01-12 ENCOUNTER — Encounter: Payer: Self-pay | Admitting: *Deleted

## 2019-01-13 NOTE — Telephone Encounter (Signed)
Left message giving patient's daughter results of the [facility] and informed her that we will address all issues after his [facility] visit.

## 2019-01-30 ENCOUNTER — Telehealth: Payer: Self-pay | Admitting: Family Medicine

## 2019-01-30 ENCOUNTER — Other Ambulatory Visit: Payer: Self-pay

## 2019-01-30 MED ORDER — LOSARTAN POTASSIUM-HCTZ 100-12.5 MG PO TABS: 0.5000 | ORAL_TABLET | Freq: Every day | ORAL | 3 refills | Status: DC

## 2019-01-30 NOTE — Telephone Encounter (Signed)
Rx refill

## 2019-01-30 NOTE — Telephone Encounter (Signed)
Copied from [place] [id number]. Topic: Quick Communication - Rx Refill/Question >> Jan 30, 2019 11:24 AM Scherrie Gerlach wrote: Medication: losartan-hydrochlorothiazide (HYZAAR) 100-12.5 MG tablet  Wife calling for pt to have this refilled.  [id number] [address], [place] [phone] (Phone) [phone] (Fax)

## 2019-02-03 ENCOUNTER — Ambulatory Visit (INDEPENDENT_AMBULATORY_CARE_PROVIDER_SITE_OTHER): Payer: Medicare Other | Admitting: Psychology

## 2019-02-03 DIAGNOSIS — F064 Anxiety disorder due to known physiological condition: Secondary | ICD-10-CM

## 2019-02-05 ENCOUNTER — Ambulatory Visit: Payer: Medicare Other | Admitting: Neurology

## 2019-02-11 ENCOUNTER — Ambulatory Visit (INDEPENDENT_AMBULATORY_CARE_PROVIDER_SITE_OTHER): Payer: Medicare Other | Admitting: Family Medicine

## 2019-02-11 ENCOUNTER — Encounter: Payer: Self-pay | Admitting: Family Medicine

## 2019-02-11 ENCOUNTER — Other Ambulatory Visit: Payer: Self-pay

## 2019-02-11 VITALS — BP 122/82 | HR 80 | Temp 97.7°F | Ht 73.0 in | Wt 171.8 lb

## 2019-02-11 DIAGNOSIS — Z0000 Encounter for general adult medical examination without abnormal findings: Secondary | ICD-10-CM | POA: Diagnosis not present

## 2019-02-11 DIAGNOSIS — I10 Essential (primary) hypertension: Secondary | ICD-10-CM | POA: Diagnosis not present

## 2019-02-11 DIAGNOSIS — I480 Paroxysmal atrial fibrillation: Secondary | ICD-10-CM

## 2019-02-11 DIAGNOSIS — I495 Sick sinus syndrome: Secondary | ICD-10-CM | POA: Diagnosis not present

## 2019-02-11 DIAGNOSIS — E785 Hyperlipidemia, unspecified: Secondary | ICD-10-CM

## 2019-02-11 LAB — COMPREHENSIVE METABOLIC PANEL
ALT: 9 U/L (ref 0–53)
AST: 20 U/L (ref 0–37)
Albumin: 4.6 g/dL (ref 3.5–5.2)
Alkaline Phosphatase: 63 U/L (ref 39–117)
BUN: 17 mg/dL (ref 6–23)
CO2: 32 mEq/L (ref 19–32)
Calcium: 9.6 mg/dL (ref 8.4–10.5)
Chloride: 99 mEq/L (ref 96–112)
Creatinine, Ser: 0.99 mg/dL (ref 0.40–1.50)
GFR: 72.98 mL/min (ref >60.00)
Glucose, Bld: 93 mg/dL (ref 70–99)
Potassium: 3.9 mEq/L (ref 3.5–5.1)
Sodium: 141 mEq/L (ref 135–145)
Total Bilirubin: 1.8 mg/dL — ABNORMAL HIGH (ref 0.2–1.2)
Total Protein: 6.8 g/dL (ref 6.0–8.3)

## 2019-02-11 LAB — CBC
HCT: 49 % (ref 39.0–52.0)
Hemoglobin: 16.6 g/dL (ref 13.0–17.0)
MCHC: 33.8 g/dL (ref 30.0–36.0)
MCV: 92.2 fl (ref 78.0–100.0)
Platelets: 117 10*3/uL — ABNORMAL LOW (ref 150.0–400.0)
RBC: 5.32 Mil/uL (ref 4.22–5.81)
RDW: 13.9 % (ref 11.5–15.5)
WBC: 6.2 10*3/uL (ref 4.0–10.5)

## 2019-02-11 LAB — LIPID PANEL
Cholesterol: 99 mg/dL (ref 0–200)
HDL: 53.3 mg/dL (ref >39.00)
LDL Cholesterol: 37 mg/dL (ref 0–99)
NonHDL: 45.58
Total CHOL/HDL Ratio: 2
Triglycerides: 41 mg/dL (ref 0.0–149.0)
VLDL: 8.2 mg/dL (ref 0.0–40.0)

## 2019-02-11 MED ORDER — APIXABAN 5 MG PO TABS: 5.0000 mg | ORAL_TABLET | Freq: Two times a day (BID) | ORAL | 3 refills | Status: DC

## 2019-02-11 NOTE — Patient Instructions (Addendum)
No changes today  4-6 month follow up  Please stop by lab before you go If you do not have mychart- we will call you about results within 5 business days of [place] receiving them.  If you have mychart- we will send your results within 3 business days of [place] receiving them.  If abnormal or we want to clarify a result, we will call or mychart you to make sure you receive the message.  If you have questions or concerns or don't hear within 5-7 days, please send [place] a message or call us.

## 2019-02-11 NOTE — Assessment & Plan Note (Signed)
Per Dr. Tanna Furry prior notes-there was some concern patient will need a pacemaker eventually.  No recent mention of this.

## 2019-02-11 NOTE — Progress Notes (Signed)
Phone: [phone]   Subjective:  Patient presents today for their annual physical. Chief complaint-noted.   See problem oriented charting- ROS- full  review of systems was completed and negative other than baseline tremor.  The following were reviewed and entered/updated in epic: Past Medical History:  Diagnosis Date  . Allergic rhinitis   . Allergy   . Anxiety   . Aortic insufficiency    mild... echo... 02/2010  . Atrial flutter ([place]) 02/03/2010   Consult by Dr. Lovena Le... June, 2011.... plan rate control...  Coumadin not needed... ablation if rate not controlled well  . Basal cell carcinoma   . Chest pain    Nuclear, March, 2010, no ischemia  . Degenerative joint disease   . Diastolic dysfunction    EF 50-55%... echo.. 02/2010 (55-60% echo 2009)  . Dyslipidemia   . Ejection fraction    EF 50-55%, echo, June, 2011  . Hemorrhoids   . HEMORRHOIDS 10/21/2007   Qualifier: History of  By: [name], [name]   . HTN (hypertension)   . Incomplete RBBB    IRBBB; sinus bradycardia w/ PVCs  . LVH (left ventricular hypertrophy)    moderate... echo... 02/2010  . Mitral regurgitation    mild... echo.. 02/2010  . PVC (premature ventricular contraction)   . Renal calculus   . Sinus bradycardia    Patient Active Problem List   Diagnosis Date Noted  . Parkinson's disease ([place]) 03/09/2016    Priority: High  . PAF (paroxysmal atrial fibrillation) ([place]) 02/03/2010    Priority: High  . Aortic insufficiency 07/20/2015    Priority: Medium  . Chest pain     Priority: Medium  . Essential hypertension     Priority: Medium  . Mitral regurgitation     Priority: Medium  . Diastolic dysfunction     Priority: Medium  . Dyslipidemia     Priority: Medium  . Primary osteoarthritis of right hip 06/19/2018    Priority: Low  . Allergy 12/07/2016    Priority: Low  . Sinus node dysfunction ([facility]) 08/02/2015    Priority: Low  . Hx of colonic polyps 01/13/2013    Priority: Low  . Incomplete RBBB      Priority: Low  . LVH (left ventricular hypertrophy)     Priority: Low  . Sinus bradycardia     Priority: Low  . PVC (premature ventricular contraction)     Priority: Low  . Allergic rhinitis 12/28/2009    Priority: Low  . History of basal cell carcinoma of skin 10/26/2008    Priority: Low  . Anxiety state 10/21/2007    Priority: Low  . Osteoarthritis 10/21/2007    Priority: Low  . RENAL CALCULUS, HX OF 10/20/2007    Priority: Low   Past Surgical History:  Procedure Laterality Date  . COLONOSCOPY    . KIDNEY STONE SURGERY    . POLYPECTOMY      Family History  Problem Relation Age of Onset  . Hypertension Mother   . Stroke Mother 5       lived to 60  . Other Father        parkinsons ? 84  . Colon cancer Paternal Aunt   . Colon cancer Paternal Uncle     Medications- reviewed and updated Current Outpatient Medications  Medication Sig Dispense Refill  . apixaban (ELIQUIS) 5 MG TABS tablet Take 1 tablet (5 mg total) by mouth 2 (two) times daily. 180 tablet 3  . atenolol (TENORMIN) 25 MG tablet  Take 1 tablet (25 mg total) by mouth daily. 90 tablet 3  . carbidopa-levodopa (SINEMET IR) 25-100 MG tablet Take 1 tablet by mouth 3 (three) times daily. 270 tablet 1  . escitalopram (LEXAPRO) 5 MG tablet Take 1 tablet (5 mg total) by mouth daily. 90 tablet 1  . losartan-hydrochlorothiazide (HYZAAR) 100-12.5 MG tablet Take 0.5 tablets by mouth daily. 46 tablet 3  . simvastatin (ZOCOR) 40 MG tablet Take 1 tablet (40 mg total) by mouth at bedtime. 90 tablet 3   No current facility-administered medications for this visit.     Allergies-reviewed and updated No Known Allergies  Social History   Social History Narrative   Married- wife patient of Dr. [name]. Son and daughter in 37s in 2018. 2 67 year old grandchild.  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 Active with parkinsons exercise class- boxing and cycling   Objective  Objective:  BP 122/82 (BP Location: Left Arm, Patient Position: Sitting, Cuff Size: Normal)   Pulse 80   Temp 97.7 F (36.5 C)   Ht 6\' 1"  (1.854 m)   Wt 171 lb 12.8 oz (77.9 kg)   SpO2 95%   BMI 22.67 kg/m  Gen: NAD, resting comfortably HEENT: Mucous membranes are moist. Oropharynx normal Neck: no thyromegaly and no cervical lymphadenopathy CV: RRR no murmurs rubs or gallops Lungs: CTAB no crackles, wheeze, rhonchi Abdomen: soft/nontender/nondistended/normal bowel sounds. No rebound or guarding.  Ext: no edema Skin: warm, dry Neuro: Resting tremor noted, normal speech though does look to his wife for some answers    Assessment and Plan  78 y.o. male presenting for annual physical.  Health Maintenance counseling: 1. Anticipatory guidance: Patient counseled regarding regular dental exams -q6 months, eye exams - q2yearly,  avoiding smoking and second hand smoke , limiting alcohol to 2 beverages per day - doesn't drink.   2. Risk factor reduction:  Advised patient of need for regular exercise and diet rich and fruits and vegetables to reduce risk of heart attack and stroke. Exercise- doing youtube boxing videos and walking and biking and mowing. Diet-reasonably healthy diet.  Wt Readings from Last 3 Encounters:  02/11/19 171 lb 12.8 oz (77.9 kg)  01/09/19 170 lb (77.1 kg)  12/22/18 173 lb 12.8 oz (78.8 kg)  3. Immunizations/screenings/ancillary studies- fully up to date  Immunization History  Administered Date(s) Administered  . Influenza Split 06/24/2011, 06/03/2012, 06/08/2013, 07/12/2014  . Influenza Whole 10/23/2009, 06/28/2010  . Influenza, High Dose Seasonal PF 07/09/2016, 06/20/2017, 05/20/2018  . Influenza-Unspecified 07/06/2015  . Pneumococcal Conjugate-13 07/19/2014  . Pneumococcal Polysaccharide-23 12/07/2016  . Tdap 09/03/2009, 01/02/2012  . Zoster Recombinat (Shingrix) 02/03/2018,  06/13/2018  4. Prostate cancer screening-  passed age based screening recommedations. Prior psa trend overall low risk Lab Results  Component Value Date   PSA 2.560 12/13/2017   PSA 2.24 02/06/2016   PSA 2.10 01/17/2015   5. Colon cancer screening - 08/2014 with 5 year follow up per GI notes 6. Skin cancer screening- sees derm due to history of skin cancer- dries to [place]. advised regular sunscreen use. Denies worrisome, changing, or new skin lesions.  7. never smoker   Status of chronic or acute concerns   # covid 56 update- daughter sick for 2 weeks but not hospitalized.   % Parkinson's disease/anxiety-follows with Dr. Carles Collet of neurology S:  He typically does rock steady boxing program.  Tries to do some walking and still mows his own lawn.  Compliant with Sinemet.  He is also compliant with Lexapro 5 mg from Dr. Carles Collet  A/P:  Stable. Continue current medications.     #% Atrial fibrillation-also follows with Dr. Lovena Le of cardiology S: Patient is anticoagulated with Eliquis.  Also on atenolol for rate control.  He is asymptomatic but appears to remain in atrial fibrillation. A/P:  Stable. Continue current medications.    #hypertension S: Compliant with atenolol 25 mg as well as losartan hydrochlorothiazide 100-12.5 mg but only takes 1/2 tablet A/P: Well-controlled-continue current medications  #Dyslipidemia S:  Compliant with simvastatin 40 mg.  LDL well controlled under 70 A/P: Likely well-controlled-update lipid panel   % Aortic insufficiency and mitral regurgitation- patient treated with dental prophylaxis in the past  4 to 50-month follow-up discussed Future Appointments  Date Time Provider [place]  02/17/2019  1:00 PM Natividad Brood, LCSW [facility] None  06/19/2019  1:00 PM Tat, Eustace Quail, DO [facility] None  07/14/2019 10:40 AM [name] Brayton Mars, MD [facility]   Lab/Order associations:fasting  Preventative health care - Plan: Comprehensive metabolic panel,  Lipid panel, CBC  PAF (paroxysmal atrial fibrillation) (HCC)  Essential hypertension  Dyslipidemia - Plan: Comprehensive metabolic panel, Lipid panel, CBC  Sinus node dysfunction ([place])  Meds ordered this encounter  Medications  . apixaban (ELIQUIS) 5 MG TABS tablet    Sig: Take 1 tablet (5 mg total) by mouth 2 (two) times daily.    Dispense:  180 tablet    Refill:  3    Return precautions advised.  Garret Reddish, MD

## 2019-02-17 ENCOUNTER — Ambulatory Visit: Payer: Medicare Other | Admitting: Psychology

## 2019-02-27 ENCOUNTER — Ambulatory Visit (INDEPENDENT_AMBULATORY_CARE_PROVIDER_SITE_OTHER): Payer: Medicare Other | Admitting: Psychology

## 2019-02-27 DIAGNOSIS — F064 Anxiety disorder due to known physiological condition: Secondary | ICD-10-CM | POA: Diagnosis not present

## 2019-03-25 ENCOUNTER — Ambulatory Visit (INDEPENDENT_AMBULATORY_CARE_PROVIDER_SITE_OTHER): Payer: Medicare Other | Admitting: Psychology

## 2019-03-25 DIAGNOSIS — F064 Anxiety disorder due to known physiological condition: Secondary | ICD-10-CM | POA: Diagnosis not present

## 2019-04-01 ENCOUNTER — Other Ambulatory Visit: Payer: Self-pay

## 2019-04-01 MED ORDER — CARBIDOPA-LEVODOPA 25-100 MG PO TABS: 1.0000 | ORAL_TABLET | Freq: Three times a day (TID) | ORAL | 1 refills | Status: DC

## 2019-04-01 NOTE — Telephone Encounter (Signed)
Requested Prescriptions   Pending Prescriptions Disp Refills  . carbidopa-levodopa (SINEMET IR) 25-100 MG tablet 270 tablet 1    Sig: Take 1 tablet by mouth 3 (three) times daily.   Rx last filled: 10/01/18 #90 1 refills  Pt last seen: 01/09/19  Follow up appt scheduled:05/21/19

## 2019-04-06 ENCOUNTER — Other Ambulatory Visit: Payer: Self-pay

## 2019-04-06 MED ORDER — ESCITALOPRAM OXALATE 5 MG PO TABS: 5.0000 mg | ORAL_TABLET | Freq: Every day | ORAL | 1 refills | Status: DC

## 2019-04-06 NOTE — Telephone Encounter (Signed)
Requested Prescriptions   Pending Prescriptions Disp Refills  . escitalopram (LEXAPRO) 5 MG tablet 90 tablet 1    Sig: Take 1 tablet (5 mg total) by mouth daily.   Rx last filled: 10/01/18 #90 1 refills  Pt last seen: 01/09/19  Follow up appt scheduled: 05/21/19

## 2019-04-07 ENCOUNTER — Telehealth: Payer: Self-pay | Admitting: Neurology

## 2019-04-07 NOTE — Telephone Encounter (Signed)
Escitalopram 5 mg refill to the pharm on file. Thanks!

## 2019-04-07 NOTE — Telephone Encounter (Signed)
Requested Prescriptions   Pending Prescriptions Disp Refills  . escitalopram (LEXAPRO) 5 MG tablet 90 tablet 1    Sig: Take 1 tablet (5 mg total) by mouth daily.   Rx last filled: yesterday  Pt last seen:01/09/19  Follow up appt scheduled: 05/21/19   Denied this request Patient should contact pharmacy rx was sent yesterday #90 1 refill Called patient to make him aware of this spoke with spouse  She will contact pharmacy.

## 2019-04-15 ENCOUNTER — Ambulatory Visit (INDEPENDENT_AMBULATORY_CARE_PROVIDER_SITE_OTHER): Payer: Medicare Other | Admitting: Psychology

## 2019-04-15 DIAGNOSIS — F064 Anxiety disorder due to known physiological condition: Secondary | ICD-10-CM | POA: Diagnosis not present

## 2019-05-15 LAB — CBC: RBC: 5.38 — AB (ref 3.87–5.11)

## 2019-05-15 LAB — CBC AND DIFFERENTIAL: WBC: 5.5

## 2019-05-18 ENCOUNTER — Telehealth: Payer: Self-pay | Admitting: Family Medicine

## 2019-05-18 NOTE — Progress Notes (Signed)
Carlos [name] was seen today in follow up for Parkinsons disease.  Pt is currently on carbidopa/levodopa 25/100, 1 tablet 3 times per day.  Pt denies falls.  Pt denies lightheadedness, near syncope.  No hallucinations.  Mood has been "great."  Patient and wife report that he had neurocognitive testing done at the [facility] since our last visit.  We recently received a copy of this.  This was done through telehealth.  It was felt that the patient likely had Lewy body dementia.  As evidence of this, the neuropsychologist stated that the onset predated the motor symptoms of Parkinson's disease and was rapid in progression.  The neuropsychologist also stated that the patient had "little benefit to Sinemet."  The neuropsychologist recommended that Carlos [name] stopped driving.  They recommended a sleep study to investigate RBD.  However, it also noted that the wife just mentioned that he spoke in his sleep and movements have somewhat increased.    ALLERGIES:  No Known Allergies  CURRENT MEDICATIONS:  Outpatient Encounter Medications as of 05/21/2019  Medication Sig   apixaban (ELIQUIS) 5 MG TABS tablet Take 1 tablet (5 mg total) by mouth 2 (two) times daily.   atenolol (TENORMIN) 25 MG tablet Take 1 tablet (25 mg total) by mouth daily.   carbidopa-levodopa (SINEMET IR) 25-100 MG tablet Take 1 tablet by mouth 3 (three) times daily.   escitalopram (LEXAPRO) 5 MG tablet Take 1 tablet (5 mg total) by mouth daily.   losartan-hydrochlorothiazide (HYZAAR) 100-12.5 MG tablet Take 0.5 tablets by mouth daily.   simvastatin (ZOCOR) 40 MG tablet Take 1 tablet (40 mg total) by mouth at bedtime.   No facility-administered encounter medications on file as of 05/21/2019.     PAST MEDICAL HISTORY:   Past Medical History:  Diagnosis Date   Allergic rhinitis    Allergy    Anxiety    Aortic insufficiency    mild... echo... 02/2010   Atrial flutter ([place]) 02/03/2010   Consult by Dr. Lovena Le...  June, 2011.... plan rate control...  Coumadin not needed... ablation if rate not controlled well   Basal cell carcinoma    Chest pain    Nuclear, March, 2010, no ischemia   Degenerative joint disease    Diastolic dysfunction    EF 50-55%... echo.. 02/2010 (55-60% echo 2009)   Dyslipidemia    Ejection fraction    EF 50-55%, echo, June, 2011   Hemorrhoids    HEMORRHOIDS 10/21/2007   Qualifier: History of  By: [name], [name]    HTN (hypertension)    Incomplete RBBB    IRBBB; sinus bradycardia w/ PVCs   LVH (left ventricular hypertrophy)    moderate... echo... 02/2010   Mitral regurgitation    mild... echo.. 02/2010   PVC (premature ventricular contraction)    Renal calculus    Sinus bradycardia     PAST SURGICAL HISTORY:   Past Surgical History:  Procedure Laterality Date   COLONOSCOPY     KIDNEY STONE SURGERY     POLYPECTOMY      SOCIAL HISTORY:   Social History   Socioeconomic History   Marital status: Married    Spouse name: Carlos [name]   Number of children: 2   Years of education: Not on file   Highest education level: Master's degree (e.g., MA, MS, MEng, MEd, MSW, MBA)  Occupational History   Occupation: retired    Comment: Doctor, general practice strain: Not  on file   Food insecurity    Worry: Not on file    Inability: Not on file   Transportation needs    Medical: Not on file    Non-medical: Not on file  Tobacco Use   Smoking status: Never Smoker   Smokeless tobacco: Never Used  Substance and Sexual Activity   Alcohol use: Yes    Alcohol/week: 0.0 - 1.0 standard drinks    Comment: etoh; glass of wine one time a week    Drug use: No   Sexual activity: Not on file  Lifestyle   Physical activity    Days per week: Not on file    Minutes per session: Not on file   Stress: Not on file  Relationships   Social connections    Talks on phone: Not on file    Gets together: Not on file     Attends religious service: Not on file    Active member of club or organization: Not on file    Attends meetings of clubs or organizations: Not on file    Relationship status: Not on file   Intimate partner violence    Fear of current or ex partner: Not on file    Emotionally abused: Not on file    Physically abused: Not on file    Forced sexual activity: Not on file  Other Topics Concern   Not on file  Social History Narrative   Married- wife patient of Dr. Yong [name]. Son and daughter in 72s in 2018. 68 73 year old grandchild.  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 Active with parkinsons exercise class- boxing and cycling    FAMILY HISTORY:   Family Status  Relation Name Status   Mother  Deceased       stroke   Father  Deceased at age 43       "old age"   Sister  Alive       healthy   Sister  Alive       healthy   Child x2 Alive       2, healthy   Field seismologist  (Not Specified)   Psychiatrist  (Not Specified)   MGM  Deceased   MGF  Deceased   PGM  Deceased   PGF  Deceased    ROS:  ROS  PHYSICAL EXAMINATION:    VITALS:   Vitals:   05/21/19 1251  BP: 138/83  Pulse: 72  SpO2: 98%  Weight: 170 lb (77.1 kg)  Height: 6' (1.829 m)    GEN:  The patient appears stated age and is in NAD. HEENT:  Normocephalic, atraumatic.  The mucous membranes are moist. The superficial temporal arteries are without ropiness or tenderness. CV:  RRR Lungs:  CTAB Neck/HEME:  There are no carotid bruits bilaterally.  Neurological examination:  Orientation: The patient is alert and oriented x3. Montreal Cognitive Assessment  01/09/2019  Visuospatial/ Executive (0/5) 1  Naming (0/3) 3  Attention: Read list of digits (0/2) 0  Attention: Read list of letters (0/1) 0  Attention: Serial 7 subtraction starting at 100 (0/3) 2  Language: Repeat phrase (0/2)  0  Language : Fluency (0/1) 0  Abstraction (0/2) 1  Delayed Recall (0/5) 0  Orientation (0/6) 3  Total 10   Cranial nerves: There is good facial symmetry with  mild facial hypomimia. The speech is fluent and clear. Soft palate rises symmetrically and there is no tongue deviation. Hearing is intact to conversational tone. Sensation: Sensation is intact to light touch throughout Motor: Strength is at least antigravity x4.  Movement examination: Tone: There is normal tone in the UE/LE Abnormal movements: there is RUE resting tremor Coordination:  There is no decremation with RAM's, with any form of RAMS, including alternating supination and pronation of the forearm, hand opening and closing, finger taps, heel taps and toe taps. Gait and Station: The patient has mild difficulty arising out of a deep-seated chair without the use of the hands. The patient's stride length is good with good arm swing.     ASSESSMENT/PLAN:  1. Idiopathic Parkinson's disease.  -talked with patient about getting back to physical exercise (their computer broke so unable to do RSB)  -doing well with mental exercises and congratulated him on that -continue carbidopa/levodopa 25/100, 1 po tid.                2. Memory change -had neurocognitive testing in 01/2017 with Dr. [name] and evidence of MCI without evidence of dementia.     Family reports that he had repeat neurocognitive testing at the [facility] in August, 2020.  He was told he had Lewy body dementia.  Discussed with the family that he does not have Lewy body disease, but rather Parkinson's disease dementia.  The first evidence of this, was the fact that he had motor symptoms preceding memory symptoms.  In fact, he had normal neurocognitive testing in May, 2018.  This would not be the case with Lewy body disease.  He has also not had any visual hallucinations.  He has had significant decline in memory since May,  2018.  I would agree that he has dementia, but would agree that this is Parkinson's dementia.  Discussed the difference between Parkinson's disease dementia and Lewy body disease.    -agree with no driving  -long discussion with pt/family regarding medications/acetylcholinesterase inhibitors.  They would like to start aricept 5 mg daily for a month and then increase to 10 mg daily thereafter.  Risks, benefits, side effects and alternative therapies were discussed.  The opportunity to ask questions was given and they were answered to the best of my ability.  The patient expressed understanding and willingness to follow the outlined treatment protocols.  3. HTN -on atenolol and hyzaar. May be on beta blocker for aflutter/afib.   4. Vasomotor rhinorrhea - Discussed atropine gtts - he will hold on that for now    5.GAD -discussed meds or counseling/biofeedback/CBT.              -They believe Lexapro has helped.  Wife thought it perhaps it caused some dreaming (patient does not notice it (as she has noticed mumbling during the sleep.  I did tell her that drinking can be associated with Parkinson's disease, but I doubt Lexapro has made that worse.  6.  Follow up is anticipated in the next 4-6 months, sooner should new neurologic issues arise.  Much greater than 50% of this visit was spent in counseling and coordinating care.  Total face to face time:  25 min  Cc:  [name], MD

## 2019-05-18 NOTE — Telephone Encounter (Signed)
I spoke with the patients wife to schedule AWVs for both her and her husband.  Because of his health concerns, she would like to call me back around the middle of October before scheduling any additional appointments.  She would prefer to have it via telephone call if possible. VDM (DD)

## 2019-05-21 ENCOUNTER — Encounter: Payer: Self-pay | Admitting: Neurology

## 2019-05-21 ENCOUNTER — Ambulatory Visit (INDEPENDENT_AMBULATORY_CARE_PROVIDER_SITE_OTHER): Payer: Medicare Other | Admitting: Neurology

## 2019-05-21 ENCOUNTER — Other Ambulatory Visit: Payer: Self-pay

## 2019-05-21 VITALS — BP 138/83 | HR 72 | Ht 72.0 in | Wt 170.0 lb

## 2019-05-21 DIAGNOSIS — G20 Parkinson's disease: Secondary | ICD-10-CM

## 2019-05-21 DIAGNOSIS — F0280 Dementia in other diseases classified elsewhere without behavioral disturbance: Secondary | ICD-10-CM

## 2019-05-21 MED ORDER — DONEPEZIL HCL 10 MG PO TABS: 10.0000 mg | ORAL_TABLET | Freq: Every day | ORAL | 3 refills | Status: DC

## 2019-05-21 MED ORDER — DONEPEZIL HCL 5 MG PO TABS: 5.0000 mg | ORAL_TABLET | Freq: Every day | ORAL | 1 refills | Status: DC

## 2019-05-21 NOTE — Patient Instructions (Addendum)
Start donepezil, 5 mg, 1 tablet daily for a month and then increase to 10 mg daily thereafter  I recommend no driving

## 2019-05-28 DIAGNOSIS — L570 Actinic keratosis: Secondary | ICD-10-CM | POA: Diagnosis not present

## 2019-06-05 ENCOUNTER — Encounter: Payer: Medicare Other | Admitting: Family Medicine

## 2019-06-19 ENCOUNTER — Ambulatory Visit: Payer: Medicare Other | Admitting: Neurology

## 2019-07-09 ENCOUNTER — Other Ambulatory Visit: Payer: Self-pay | Admitting: Family Medicine

## 2019-07-14 ENCOUNTER — Ambulatory Visit: Payer: Medicare Other | Admitting: Family Medicine

## 2019-07-21 ENCOUNTER — Encounter: Payer: Self-pay | Admitting: Gastroenterology

## 2019-07-23 ENCOUNTER — Other Ambulatory Visit: Payer: Self-pay

## 2019-07-23 MED ORDER — ESCITALOPRAM OXALATE 5 MG PO TABS: 5.0000 mg | ORAL_TABLET | Freq: Every day | ORAL | 3 refills | Status: DC

## 2019-07-24 ENCOUNTER — Ambulatory Visit: Payer: Medicare Other | Admitting: Family Medicine

## 2019-08-07 ENCOUNTER — Telehealth: Payer: Self-pay | Admitting: Family Medicine

## 2019-08-07 NOTE — Telephone Encounter (Signed)
FYI

## 2019-08-07 NOTE — Telephone Encounter (Signed)
Copied from [place] [id number]. Topic: General - Inquiry >> Aug 07, 2019 12:10 PM Virl Axe D wrote: Reason for CRM: Pt's wife stated his Covid 67 results were negative.

## 2019-08-08 NOTE — Telephone Encounter (Signed)
That is great news

## 2019-09-25 ENCOUNTER — Telehealth: Payer: Self-pay | Admitting: Family Medicine

## 2019-09-25 NOTE — Telephone Encounter (Signed)
I called the pt to schedule AWV with Courtney along with 1/26 OV, but there was no answer.

## 2019-09-29 ENCOUNTER — Ambulatory Visit (INDEPENDENT_AMBULATORY_CARE_PROVIDER_SITE_OTHER): Payer: Medicare Other | Admitting: Family Medicine

## 2019-09-29 ENCOUNTER — Other Ambulatory Visit: Payer: Self-pay

## 2019-09-29 ENCOUNTER — Encounter: Payer: Self-pay | Admitting: Family Medicine

## 2019-09-29 VITALS — BP 122/84 | HR 84 | Temp 97.9°F | Ht 72.0 in | Wt 178.0 lb

## 2019-09-29 DIAGNOSIS — G20A1 Parkinson's disease without dyskinesia, without mention of fluctuations: Secondary | ICD-10-CM

## 2019-09-29 DIAGNOSIS — I10 Essential (primary) hypertension: Secondary | ICD-10-CM

## 2019-09-29 DIAGNOSIS — I480 Paroxysmal atrial fibrillation: Secondary | ICD-10-CM | POA: Diagnosis not present

## 2019-09-29 DIAGNOSIS — G20 Parkinson's disease: Secondary | ICD-10-CM

## 2019-09-29 DIAGNOSIS — Z860100 Personal history of colon polyps, unspecified: Secondary | ICD-10-CM

## 2019-09-29 DIAGNOSIS — E785 Hyperlipidemia, unspecified: Secondary | ICD-10-CM

## 2019-09-29 DIAGNOSIS — D696 Thrombocytopenia, unspecified: Secondary | ICD-10-CM

## 2019-09-29 LAB — CBC WITH DIFFERENTIAL/PLATELET
Basophils Absolute: 0.1 10*3/uL (ref 0.0–0.1)
Basophils Relative: 0.9 % (ref 0.0–3.0)
Eosinophils Absolute: 0.1 10*3/uL (ref 0.0–0.7)
Eosinophils Relative: 0.9 % (ref 0.0–5.0)
HCT: 49.6 % (ref 39.0–52.0)
Hemoglobin: 16.7 g/dL (ref 13.0–17.0)
Lymphocytes Relative: 25.2 % (ref 12.0–46.0)
Lymphs Abs: 1.5 10*3/uL (ref 0.7–4.0)
MCHC: 33.6 g/dL (ref 30.0–36.0)
MCV: 91.4 fl (ref 78.0–100.0)
Monocytes Absolute: 0.5 10*3/uL (ref 0.1–1.0)
Monocytes Relative: 9.2 % (ref 3.0–12.0)
Neutro Abs: 3.7 10*3/uL (ref 1.4–7.7)
Neutrophils Relative %: 63.8 % (ref 43.0–77.0)
Platelets: 110 10*3/uL — ABNORMAL LOW (ref 150.0–400.0)
RBC: 5.42 Mil/uL (ref 4.22–5.81)
RDW: 13.6 % (ref 11.5–15.5)
WBC: 5.8 10*3/uL (ref 4.0–10.5)

## 2019-09-29 LAB — COMPREHENSIVE METABOLIC PANEL
ALT: 17 U/L (ref 0–53)
AST: 21 U/L (ref 0–37)
Albumin: 4.4 g/dL (ref 3.5–5.2)
Alkaline Phosphatase: 66 U/L (ref 39–117)
BUN: 20 mg/dL (ref 6–23)
CO2: 34 mEq/L — ABNORMAL HIGH (ref 19–32)
Calcium: 9.8 mg/dL (ref 8.4–10.5)
Chloride: 100 mEq/L (ref 96–112)
Creatinine, Ser: 1.01 mg/dL (ref 0.40–1.50)
GFR: 71.2 mL/min (ref >60.00)
Glucose, Bld: 77 mg/dL (ref 70–99)
Potassium: 3.6 mEq/L (ref 3.5–5.1)
Sodium: 139 mEq/L (ref 135–145)
Total Bilirubin: 1.6 mg/dL — ABNORMAL HIGH (ref 0.2–1.2)
Total Protein: 6.9 g/dL (ref 6.0–8.3)

## 2019-09-29 LAB — TSH: TSH: 1.51 u[IU]/mL (ref 0.35–4.50)

## 2019-09-29 MED ORDER — SIMVASTATIN 20 MG PO TABS: 20.0000 mg | ORAL_TABLET | Freq: Every day | ORAL | 3 refills | Status: DC

## 2019-09-29 NOTE — Assessment & Plan Note (Signed)
#  Thrombocytopenia-mild decrease in platelets that appears to be his baseline.  Continue to trend CBC every 6 to 12 months

## 2019-09-29 NOTE — Assessment & Plan Note (Signed)
S:  Compliant with simvastatin 40 mg.  LDL well controlled under 70 Lab Results  Component Value Date   CHOL 99 02/11/2019   HDL 53.30 02/11/2019   LDLCALC 37 02/11/2019   LDLDIRECT 37.0 12/17/2017   TRIG 41.0 02/11/2019   CHOLHDL 2 02/11/2019   A/P: Excellent control on last check-discussed actually could reduce simvastatin to 20 mg- we all agreed.

## 2019-09-29 NOTE — Patient Instructions (Addendum)
Health Maintenance Due  Topic Date Due  . COLONOSCOPY -needs to schedule 08/21/2019   Reduce simvastatin to 20 mg. Can cut 40mg  tablets in half until you run out then start 20 mg tablets. You had slight increased diabetes risk on labs with [facility] and higher doses of statins can increase risk.   We will call you within two weeks about your referral to Dr. Fuller Plan of GI to see if you need another colonoscopy. If you do not hear within 3 weeks, give [place] a call.    Please stop by lab before you go- I do think abdominal growth/ swelling may be coming from weight gain  But lets check labs to be on the safe side- if you have worsening issues please let us know.  If you do not have mychart- we will call you about results within 5 business days of [place] receiving them.  If you have mychart- we will send your results within 3 business days of [place] receiving them.  If abnormal or we want to clarify a result, we will call or mychart you to make sure you receive the message.  If you have questions or concerns or don't hear within 5-7 days, please send [place] a message or call us.    Only change today is reducing simvastatin to 20mg 

## 2019-09-29 NOTE — Progress Notes (Signed)
Phone [phone] In person visit   Subjective:   Carlos [name] is a 79 y.o. year old very pleasant male patient who presents for/with See problem oriented charting Chief Complaint  Patient presents with  . Follow-up  . Hypertension  . Hyperlipidemia   This visit occurred during the SARS-CoV-2 public health emergency.  Safety protocols were in place, including screening questions prior to the visit, additional usage of staff PPE, and extensive cleaning of exam room while observing appropriate contact time as indicated for disinfecting solutions.   Past Medical History-  Patient Active Problem List   Diagnosis Date Noted  . Parkinson's disease ([place]) 03/09/2016    Priority: High  . PAF (paroxysmal atrial fibrillation) ([place]) 02/03/2010    Priority: High  . Aortic insufficiency 07/20/2015    Priority: Medium  . Chest pain     Priority: Medium  . Essential hypertension     Priority: Medium  . Mitral regurgitation     Priority: Medium  . Diastolic dysfunction     Priority: Medium  . Dyslipidemia     Priority: Medium  . Primary osteoarthritis of right hip 06/19/2018    Priority: Low  . Allergy 12/07/2016    Priority: Low  . Sinus node dysfunction (HCC) 08/02/2015    Priority: Low  . Hx of colonic polyps 01/13/2013    Priority: Low  . Incomplete RBBB     Priority: Low  . LVH (left ventricular hypertrophy)     Priority: Low  . Sinus bradycardia     Priority: Low  . PVC (premature ventricular contraction)     Priority: Low  . Allergic rhinitis 12/28/2009    Priority: Low  . History of basal cell carcinoma of skin 10/26/2008    Priority: Low  . Anxiety state 10/21/2007    Priority: Low  . Osteoarthritis 10/21/2007    Priority: Low  . RENAL CALCULUS, HX OF 10/20/2007    Priority: Low  . Thrombocytopenia ([place]) 09/29/2019  . Combined forms of age-related cataract 12/17/2012  . Presbyopia 12/17/2012  . Ptosis of eyelid 12/17/2012  . Vitreous syneresis 12/17/2012     Medications- reviewed and updated Current Outpatient Medications  Medication Sig Dispense Refill  . apixaban (ELIQUIS) 5 MG TABS tablet Take 1 tablet (5 mg total) by mouth 2 (two) times daily. 180 tablet 3  . atenolol (TENORMIN) 25 MG tablet TAKE 1 TABLET BY MOUTH DAILY 90 tablet 3  . carbidopa-levodopa (SINEMET IR) 25-100 MG tablet Take 1 tablet by mouth 3 (three) times daily. 270 tablet 1  . donepezil (ARICEPT) 10 MG tablet Take 1 tablet (10 mg total) by mouth at bedtime. 30 tablet 3  . donepezil (ARICEPT) 5 MG tablet Take 1 tablet (5 mg total) by mouth at bedtime. 90 tablet 1  . escitalopram (LEXAPRO) 5 MG tablet Take 1 tablet (5 mg total) by mouth daily. 90 tablet 3  . losartan-hydrochlorothiazide (HYZAAR) 100-12.5 MG tablet Take 0.5 tablets by mouth daily. 46 tablet 3  . simvastatin (ZOCOR) 20 MG tablet Take 1 tablet (20 mg total) by mouth at bedtime. 90 tablet 3   No current facility-administered medications for this visit.     Objective:  BP 122/84   Pulse 84   Temp 97.9 F (36.6 C)   Ht 6' (1.829 m)   Wt 178 lb (80.7 kg)   SpO2 99%   BMI 24.14 kg/m  Gen: NAD, resting comfortably CV: RRR no murmurs rubs or gallops Lungs: CTAB no crackles, wheeze, rhonchi  Ext: no edema Skin: warm, dry Neuro: Resting tremor noted    Assessment and Plan   # HM- got pfizer #1 COVID-19 vaccination with plan for repeat in 2 weeks  #tough year- has lost 2 family members 1st degree at least thorugh marriage. Hard to be at home.  Also lost a friend to Covid  #Abdominal fullness/weight gain S:Pt wife concerned about fullness of pt abdnomen. She is concerned he is obtaining fluid. Feels like increase has been in last month. Weight up 8 lbs from last visit. He feels like its from not exercising as much. Denies increase in dietary intake but exercise is down. Not doing rocksteady classes online- some trouble following them online- does better in person. Did online cycline for 2 months ending  yesterday but was not as intense as the rocksteady. Did better in summer as could be more active  Most recent echocardiogram in 2016 with ejection fraction of 60%.  Did have moderate aortic regurgitation up from mild. A/P: Patient suspects weight gain is related to inactivity but wife is concerned about potential underlying process-we jointly agreed to update blood work to look for any LFT changes or creatinine increase or other obvious cause of swelling.  I do not think this is indicative of heart failure or cirrhosis  % Parkinson's disease/anxiety-follows with Dr. Carles Collet of neurology- sees next month S:  He typically does rock steady boxing program-with Covid limited as above .  Tries to do some walking and still mows his own lawn.  Compliant with Sinemet.  He is also compliant with Lexapro 5 mg from Dr. Carles Collet.  He is also on Aricept for memory changes A/P: Parkinson's appears appropriately treated-continue Sinemet.  Continue follow-up with Dr. Carles Collet.  Anxiety is reasonably controlled on Lexapro 5 mg-continue current medication Please note patient has vivid dreams on Lexapro but he actually enjoys them for the most part and calls them his "entertainment"-we opted not to change medication unless Dr. Carles Collet suggested to do so  #% Atrial fibrillation-also follows with Dr. Lovena Le of cardiology S: Patient is anticoagulated with Eliquis.  Also on atenolol for rate control.  He is doing well without chest pain, shortness of breath, or palpitations. A/P:  Stable. Continue current medications with Eliquis for anticoagulation and atenolol for rate control.    #hypertension S: Compliant with atenolol 25 mg as well as losartan hydrochlorothiazide 100-12.5 mg but only takes 1/2 tablet A/P: Good control today-stable problem-continue current medication  #Dyslipidemia S:  Compliant with simvastatin 40 mg.  LDL well controlled under 70 Lab Results  Component Value Date   CHOL 99 02/11/2019   HDL 53.30 02/11/2019    LDLCALC 37 02/11/2019   LDLDIRECT 37.0 12/17/2017   TRIG 41.0 02/11/2019   CHOLHDL 2 02/11/2019   A/P: Excellent control on last check-discussed actually could reduce simvastatin to 20 mg- we all agreed.   % Aortic insufficiency and mitral regurgitation- patient treated with dental prophylaxis in the past.  No recent issues  #Thrombocytopenia-mild decrease in platelets that appears to be his baseline.  Continue to trend CBC every 6 to 12 months  #Hyperbilirubinemia-mild elevation at 1 point on last check which appears to be his baseline somewhere between 1 and 2-we opted to go ahead and repeat CBC and CMP today   #History of colon polyps-reviewed Dr. Silvio Pate note from November and he suggested an office visit to determine if colonoscopy was appropriate-referral was placed today-may be a good option for virtual visit  Recommended follow up: Return  in about 6 months (around 03/28/2020) for follow up- or sooner if needed.  Ask team to reach out and change this to physical Future Appointments  Date Time Provider [place]  10/27/2019 11:15 AM Tat, Eustace Quail, DO [facility] None  11/18/2019 11:30 AM Evans Lance, MD [facility]  03/28/2020 11:20 AM [name], Brayton Mars, MD [facility]    Lab/Order associations:   ICD-10-CM   1. PAF (paroxysmal atrial fibrillation) (HCC)  I48.0   2. Essential hypertension  I10   3. Dyslipidemia  E78.5 CBC with Differential/Platelet    Comprehensive metabolic panel    TSH  4. History of colon polyps  Z86.010 Ambulatory referral to Gastroenterology  5. Parkinson's disease ([place])  G20   6. Thrombocytopenia ([place])  D69.6     Meds ordered this encounter  Medications  . simvastatin (ZOCOR) 20 MG tablet    Sig: Take 1 tablet (20 mg total) by mouth at bedtime.    Dispense:  90 tablet    Refill:  3   Return precautions advised.  Garret Reddish, MD

## 2019-09-30 ENCOUNTER — Encounter: Payer: Self-pay | Admitting: Gastroenterology

## 2019-10-01 ENCOUNTER — Telehealth: Payer: Self-pay | Admitting: Family Medicine

## 2019-10-01 NOTE — Telephone Encounter (Signed)
From Dr. Doristine Devoid last note "long discussion with pt/family regarding medications/acetylcholinesterase inhibitors.  They would like to start aricept 5 mg daily for a month and then increase to 10 mg daily thereafter. "

## 2019-10-01 NOTE — Telephone Encounter (Signed)
When I reviewed meds with pt yesterday pt wife was going to check when she got home to see which of the two he was taking. Now she states he has both active bottles and unsure of what he should be taking. I did not see anything in your note for me to clarify with them which one he should be taking.

## 2019-10-01 NOTE — Telephone Encounter (Signed)
Patients wife called in stating that he has this medication listed twice on medlist and wants to know which one needs to be taken, the 5 MG or 10 MG.  donepezil (ARICEPT) 10 MG tablet donepezil (ARICEPT) 5 MG tablet

## 2019-10-02 ENCOUNTER — Other Ambulatory Visit: Payer: Self-pay

## 2019-10-02 NOTE — Telephone Encounter (Signed)
Returned call to patients wife she just wanted to let us know he is on the 10mg . I have updated chart.

## 2019-10-17 ENCOUNTER — Other Ambulatory Visit: Payer: Self-pay | Admitting: Family Medicine

## 2019-10-20 ENCOUNTER — Other Ambulatory Visit: Payer: Self-pay | Admitting: Neurology

## 2019-10-26 NOTE — Progress Notes (Signed)
Carlos [name] was seen today in follow up for Parkinsons disease with PDD.  Wife present and supplements the history.  Daughter on the phone during the visit.    Donepezil started last visit.  Patient tolerates the medication well, without side effects.  My previous records were reviewed prior to todays visit. Pt denies falls.  Pt denies lightheadedness, near syncope.  No hallucinations.  Dreams have been vivid but not scary - they are "interesting."  No falling out of the bed or yelling out.   Mood has been good.  hes biking for exercise via online zoom program.  He is stable with walking.  Noting trouble rolling in bed.  Noting trouble with leg/feet cramping at night. Patient saw Dr. [name] on January 26.  I reviewed those records.  Current prescribed movement disorder medications: Donepezil, 10 mg daily (started last visit). Carbidopa/levodopa 25/100, 1 tablet 3 times per day -  No wearing off of medication Lexapro, 5 mg daily   ALLERGIES:  No Known Allergies  CURRENT MEDICATIONS:  Outpatient Encounter Medications as of 10/27/2019  Medication Sig  . apixaban (ELIQUIS) 5 MG TABS tablet Take 1 tablet (5 mg total) by mouth 2 (two) times daily.  Marland Kitchen atenolol (TENORMIN) 25 MG tablet TAKE 1 TABLET BY MOUTH DAILY  . donepezil (ARICEPT) 10 MG tablet Take 1 tablet (10 mg total) by mouth at bedtime.  Marland Kitchen escitalopram (LEXAPRO) 5 MG tablet Take 1 tablet (5 mg total) by mouth daily.  Marland Kitchen losartan-hydrochlorothiazide (HYZAAR) 100-12.5 MG tablet Take 0.5 tablets by mouth daily.  . simvastatin (ZOCOR) 20 MG tablet Take 1 tablet (20 mg total) by mouth at bedtime.  . [DISCONTINUED] carbidopa-levodopa (SINEMET IR) 25-100 MG tablet TAKE 1 TABLET BY MOUTH THREE TIMES DAILY  . carbidopa-levodopa (SINEMET CR) 50-200 MG tablet Take 1 tablet by mouth at bedtime.  . carbidopa-levodopa (SINEMET IR) 25-100 MG tablet Take 1.5 tablets by mouth 3 (three) times daily.  . [DISCONTINUED] simvastatin (ZOCOR) 40 MG tablet  TAKE 1 TABLET(40 MG) BY MOUTH AT BEDTIME (Patient not taking: Reported on 10/27/2019)   No facility-administered encounter medications on file as of 10/27/2019.    PHYSICAL EXAMINATION:    VITALS:   Vitals:   10/27/19 1106  BP: 123/78  Pulse: 78  SpO2: 95%  Weight: 179 lb (81.2 kg)  Height: 6' (1.829 m)    GEN:  The patient appears stated age and is in NAD. HEENT:  Normocephalic, atraumatic.  The mucous membranes are moist. The superficial temporal arteries are without ropiness or tenderness. CV:  RRR Lungs:  CTAB Neck/HEME:  There are no carotid bruits bilaterally.  Neurological examination:  Orientation: The patient is alert and oriented x3. Cranial nerves: There is good facial symmetry with significant facial hypomimia. The speech is fluent and clear. Soft palate rises symmetrically and there is no tongue deviation. Hearing is intact to conversational tone. Sensation: Sensation is intact to light touch throughout Motor: Strength is at least antigravity x4.  Movement examination: Tone: There is mild to mod increased tone in the RUE (he is due for medication now) Abnormal movements: there is RUE rest tremor Coordination:  There is  decremation with RAM's, with any form of RAMS, including alternating supination and pronation of the forearm, hand opening and closing, finger taps, heel taps and toe taps. Gait and Station: The patient has no difficulty arising out of a deep-seated chair without the use of the hands. The patient's stride length is good.  I have reviewed and interpreted the following labs independently   Chemistry      Component Value Date/Time   NA 139 09/29/2019 1202   NA 141 12/13/2017 0000   K 3.6 09/29/2019 1202   CL 100 09/29/2019 1202   CO2 34 (H) 09/29/2019 1202   BUN 20 09/29/2019 1202   CREATININE 1.01 09/29/2019 1202      Component Value Date/Time   CALCIUM 9.8 09/29/2019 1202   ALKPHOS 66 09/29/2019 1202   AST 21 09/29/2019 1202   ALT 17  09/29/2019 1202   BILITOT 1.6 (H) 09/29/2019 1202     Lab Results  Component Value Date   TSH 1.51 09/29/2019   Lab Results  Component Value Date   WBC 5.8 09/29/2019   HGB 16.7 09/29/2019   HCT 49.6 09/29/2019   MCV 91.4 09/29/2019   PLT 110.0 (L) 09/29/2019      ASSESSMENT/PLAN:  1.  Parkinsons Disease  -increase carbidopa/levodopa 25/100, 1.5 tablets three times per day  -add carbidopa/levodopa 50/200 at bedtime for cramping/rolling over in the bedtime  -encouraged hiring personal trainer.  Information given  -discussed PT.  They will let me know if they want to proceed  2.  PDD  -neurocog testing at [facility] in 04/2019  -on donepezil, 10 mg, daily  3.  GAD  -on lexapro  Total time spent on today's visit was 40 minutes, including both face-to-face time and nonface-to-face time.  Time included that spent on review of records (prior notes available to me/labs/imaging if pertinent), discussing treatment and goals, answering patient's questions and coordinating care.  Cc:  [name], MD

## 2019-10-27 ENCOUNTER — Ambulatory Visit: Payer: Medicare Other | Admitting: Neurology

## 2019-10-27 ENCOUNTER — Other Ambulatory Visit: Payer: Self-pay

## 2019-10-27 ENCOUNTER — Encounter: Payer: Self-pay | Admitting: Neurology

## 2019-10-27 VITALS — BP 123/78 | HR 78 | Ht 72.0 in | Wt 179.0 lb

## 2019-10-27 DIAGNOSIS — F411 Generalized anxiety disorder: Secondary | ICD-10-CM | POA: Diagnosis not present

## 2019-10-27 DIAGNOSIS — F0280 Dementia in other diseases classified elsewhere without behavioral disturbance: Secondary | ICD-10-CM | POA: Diagnosis not present

## 2019-10-27 DIAGNOSIS — G20 Parkinson's disease: Secondary | ICD-10-CM

## 2019-10-27 MED ORDER — CARBIDOPA-LEVODOPA 25-100 MG PO TABS: 1.5000 | ORAL_TABLET | Freq: Three times a day (TID) | ORAL | 1 refills | Status: DC

## 2019-10-27 MED ORDER — CARBIDOPA-LEVODOPA ER 50-200 MG PO TBCR: 1.0000 | EXTENDED_RELEASE_TABLET | Freq: Every day | ORAL | 1 refills | Status: DC

## 2019-10-27 NOTE — Patient Instructions (Addendum)
1.  Increase carbidopa/levodopa 25/100, 1.5 tablets at 7am/11am/4pm 2.  Start carbidopa/levodopa 50/200 CR at bedtime for cramping and rolling in the bed 3.  Continue donepezil, 10 mg daily  The physicians and staff at [facility] are committed to providing excellent care. You may receive a survey requesting feedback about your experience at our office. We strive to receive "very good" responses to the survey questions. If you feel that your experience would prevent you from giving the office a "very good " response, please contact our office to try to remedy the situation. We may be reached at [phone]. Thank you for taking the time out of your busy day to complete the survey.

## 2019-11-05 DIAGNOSIS — R6889 Other general symptoms and signs: Secondary | ICD-10-CM | POA: Diagnosis not present

## 2019-11-12 ENCOUNTER — Ambulatory Visit: Payer: Medicare Other | Admitting: Gastroenterology

## 2019-11-12 ENCOUNTER — Telehealth: Payer: Self-pay

## 2019-11-12 ENCOUNTER — Encounter: Payer: Self-pay | Admitting: Gastroenterology

## 2019-11-12 VITALS — BP 108/68 | HR 64 | Ht 72.0 in | Wt 177.0 lb

## 2019-11-12 DIAGNOSIS — Z86010 Personal history of colonic polyps: Secondary | ICD-10-CM

## 2019-11-12 DIAGNOSIS — Z7901 Long term (current) use of anticoagulants: Secondary | ICD-10-CM | POA: Diagnosis not present

## 2019-11-12 MED ORDER — NA SULFATE-K SULFATE-MG SULF 17.5-3.13-1.6 GM/177ML PO SOLN: 1.0000 | Freq: Once | ORAL | 0 refills | Status: AC

## 2019-11-12 NOTE — Telephone Encounter (Signed)
Patient with diagnosis of atrial fibrillation on Eliquis for anticoagulation.    Procedure: Colonoscopy Date of procedure: 12/21/19  CHADS2-VASc score of 3 (HTN, AGEx2)  CrCl >60 Platelet count 110  Per Dr. Oris Drone request, patient can hold Eliquis for 2 days prior to procedure.

## 2019-11-12 NOTE — Progress Notes (Signed)
History of Present Illness: This is a 79 year old male referred by [name], MD for the evaluation of a personal history of adenomatous colon polyps and 2 second degree relatives with colon cancer.  He is accompanied by his wife.  He has no gastrointestinal complaints. Denies weight loss, abdominal pain, constipation, diarrhea, change in stool caliber, melena, hematochezia, nausea, vomiting, dysphagia, reflux symptoms, chest pain.   Colonoscopy 08/2014 1. Sessile polyp in the ascending colon; polypectomy performed with cold forceps ( tubular adenoma ) 2. Mild diverticulosis in the sigmoid colon 3. Grade l internal hemorrhoids    No Known Allergies Outpatient Medications Prior to Visit  Medication Sig Dispense Refill  . apixaban (ELIQUIS) 5 MG TABS tablet Take 1 tablet (5 mg total) by mouth 2 (two) times daily. 180 tablet 3  . atenolol (TENORMIN) 25 MG tablet TAKE 1 TABLET BY MOUTH DAILY 90 tablet 3  . carbidopa-levodopa (SINEMET CR) 50-200 MG tablet Take 1 tablet by mouth at bedtime. 90 tablet 1  . carbidopa-levodopa (SINEMET IR) 25-100 MG tablet Take 1.5 tablets by mouth 3 (three) times daily. 405 tablet 1  . donepezil (ARICEPT) 10 MG tablet Take 1 tablet (10 mg total) by mouth at bedtime. 30 tablet 3  . escitalopram (LEXAPRO) 5 MG tablet Take 1 tablet (5 mg total) by mouth daily. 90 tablet 3  . losartan-hydrochlorothiazide (HYZAAR) 100-12.5 MG tablet Take 0.5 tablets by mouth daily. 46 tablet 3  . simvastatin (ZOCOR) 20 MG tablet Take 1 tablet (20 mg total) by mouth at bedtime. 90 tablet 3   No facility-administered medications prior to visit.   Past Medical History:  Diagnosis Date  . Allergic rhinitis   . Allergy   . Anxiety   . Aortic insufficiency    mild... echo... 02/2010  . Atrial flutter ([place]) 02/03/2010   Consult by Dr. Lovena Le... June, 2011.... plan rate control...  Coumadin not needed... ablation if rate not controlled well  . Basal cell carcinoma   . Chest  pain    Nuclear, March, 2010, no ischemia  . Degenerative joint disease   . Diastolic dysfunction    EF 50-55%... echo.. 02/2010 (55-60% echo 2009)  . Dyslipidemia   . Ejection fraction    EF 50-55%, echo, June, 2011  . Hemorrhoids   . HEMORRHOIDS 10/21/2007   Qualifier: History of  By: [name], [name]   . HTN (hypertension)   . Incomplete RBBB    IRBBB; sinus bradycardia w/ PVCs  . LVH (left ventricular hypertrophy)    moderate... echo... 02/2010  . Mitral regurgitation    mild... echo.. 02/2010  . PVC (premature ventricular contraction)   . Renal calculus   . Sinus bradycardia   . Tubular adenoma of colon 08/2009   Past Surgical History:  Procedure Laterality Date  . COLONOSCOPY    . KIDNEY STONE SURGERY    . POLYPECTOMY     Social History   Socioeconomic History  . Marital status: Married    Spouse name: Zabdiel Brownridge  . Number of children: 2  . Years of education: Not on file  . Highest education level: Master's degree (e.g., MA, MS, MEng, MEd, MSW, MBA)  Occupational History  . 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 Smoking status: Never Smoker  . Smokeless tobacco: Never Used  Substance and Sexual Activity  . Alcohol use: Not Currently    Alcohol/week: 0.0 standard drinks    Comment: etoh;  glass of wine one time a week   . Drug use: No  . Sexual activity: Not on file  Other Topics Concern  . Not on file  Social History Narrative   Married- wife patient of Dr. [name]. Son and daughter in 84s in 2018. 59 50 year old grandchild.  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 Active with parkinsons exercise class- boxing and cycling   Social Determinants of Health   Financial Resource Strain:   . Difficulty of Paying Living Expenses:   Food Insecurity:   . Worried About Charity fundraiser in the Last Year:     . Arboriculturist in the Last Year:   Transportation Needs:   . Film/video editor (Medical):   Marland Kitchen Lack of Transportation (Non-Medical):   Physical Activity:   . Days of Exercise per Week:   . Minutes of Exercise per Session:   Stress:   . Feeling of Stress :   Social Connections:   . Frequency of Communication with Friends and Family:   . Frequency of Social Gatherings with Friends and Family:   . Attends Religious Services:   . Active Member of Clubs or Organizations:   . Attends Archivist Meetings:   Marland Kitchen Marital Status:    Family History  Problem Relation Age of Onset  . Hypertension Mother   . Stroke Mother 34       lived to 46  . Other Father        parkinsons ? 84  . Healthy Child   . Colon cancer Paternal Aunt   . Colon cancer Paternal Uncle         Review of Systems: Pertinent positive and negative review of systems were noted in the above HPI section. All other review of systems were otherwise negative.    Physical Exam: General: Well developed, well nourished, no acute distress Head: Normocephalic and atraumatic Eyes:  sclerae anicteric, EOMI Ears: Normal auditory acuity Mouth: Not examined, mask on during Covid-19 pandemic Neck: Supple, no masses or thyromegaly Lungs: Clear throughout to auscultation Heart: Regular rate and rhythm; no murmurs, rubs or bruits Abdomen: Soft, non tender and non distended. No masses, hepatosplenomegaly or hernias noted. Normal Bowel sounds Rectal: deferred to colonoscopy  Musculoskeletal: Symmetrical with no gross deformities  Skin: No lesions on visible extremities Pulses:  Normal pulses noted Extremities: No clubbing, cyanosis, edema or deformities noted Neurological: Alert oriented x 4, resting UE tremor Cervical Nodes:  No significant cervical adenopathy Inguinal Nodes: No significant inguinal adenopathy Psychological:  Alert and cooperative. Normal mood and affect   Assessment and  Recommendations:  1. Personal history of adenomatous colon polyps and 2 second degree relatives with colon cancer.  After discussing proceeding with colonoscopy or discontinuing surveillance he would like to proceed with 1 more colonoscopy likely his last surveillance colonoscopy. The risks (including bleeding, perforation, infection, missed lesions, medication reactions and possible hospitalization or surgery if complications occur), benefits, and alternatives to colonoscopy with possible biopsy and possible polypectomy were discussed with the patient and they consent to proceed.   2. PAF. Hold Eliquis 2 days before procedure - will instruct when and how to resume after procedure. Low but real risk of cardiovascular event such as heart attack, stroke, embolism, thrombosis or ischemia/infarct of other organs off Eliquis explained and  need to seek urgent help if this occurs. The patient consents to proceed. Will communicate by phone or EMR with patient's prescribing provider to confirm that holding Eliquis is reasonable in this case.     cc: [name], [place],  [place] [id number]

## 2019-11-12 NOTE — Telephone Encounter (Signed)
[facility] Pre-operative Risk Assessment     Request for surgical clearance:     Endoscopy Procedure  What type of surgery is being performed?     Colonoscopy  When is this surgery scheduled?     12/21/19  What type of clearance is required ?   Pharmacy  Are there any medications that need to be held prior to surgery and how long? Eliquis x 2 days  Practice name and name of physician performing surgery?      [facility]  What is your office phone and fax number?      Phone- [phone]  [phone]  Anesthesia type (None, local, MAC, general) ?       MAC

## 2019-11-12 NOTE — Patient Instructions (Signed)
You have been scheduled for a colonoscopy. Please follow written instructions given to you at your visit today.  Please pick up your prep supplies at the pharmacy within the next 1-3 days. If you use inhalers (even only as needed), please bring them with you on the day of your procedure.  Thank you for choosing me and [facility].  Malcolm T. Stark, Jr., MD., FACG  

## 2019-11-13 NOTE — Telephone Encounter (Signed)
Informed patient's wife that he can hold his Eliquis 2 days prior to his procedure. She verbalized understanding.

## 2019-11-16 ENCOUNTER — Telehealth: Payer: Self-pay | Admitting: Internal Medicine

## 2019-11-16 NOTE — Telephone Encounter (Signed)
Left detailed message for wife.  Advised ok to accompany Pt to appt.

## 2019-11-16 NOTE — Telephone Encounter (Signed)
  Pt's wife requesting to come in with the pt to his appt on 11/18/19. She said, pt has parkinson's and dementia  Please advise

## 2019-11-18 ENCOUNTER — Other Ambulatory Visit: Payer: Self-pay

## 2019-11-18 ENCOUNTER — Ambulatory Visit: Payer: Medicare Other | Admitting: Internal Medicine

## 2019-11-18 VITALS — BP 118/80 | HR 68 | Ht 72.0 in | Wt 177.0 lb

## 2019-11-18 DIAGNOSIS — I10 Essential (primary) hypertension: Secondary | ICD-10-CM

## 2019-11-18 DIAGNOSIS — I495 Sick sinus syndrome: Secondary | ICD-10-CM | POA: Diagnosis not present

## 2019-11-18 DIAGNOSIS — I480 Paroxysmal atrial fibrillation: Secondary | ICD-10-CM | POA: Diagnosis not present

## 2019-11-18 NOTE — Patient Instructions (Signed)

## 2019-11-18 NOTE — Progress Notes (Signed)
HPI [name] returns today for followup of his atrial fib. He is a pleasant 79 yo man with parkinson's disease and atrial fib which has become chronic. He has not had syncope. He has not able to exercise at the gym. He denies chest pain or sob. Because his Parkinson's is mostly on the right, he has learned to eat with his left hand. No Known Allergies   Current Outpatient Medications  Medication Sig Dispense Refill  . apixaban (ELIQUIS) 5 MG TABS tablet Take 1 tablet (5 mg total) by mouth 2 (two) times daily. 180 tablet 3  . atenolol (TENORMIN) 25 MG tablet TAKE 1 TABLET BY MOUTH DAILY 90 tablet 3  . carbidopa-levodopa (SINEMET CR) 50-200 MG tablet Take 1 tablet by mouth at bedtime. 90 tablet 1  . carbidopa-levodopa (SINEMET IR) 25-100 MG tablet Take 1.5 tablets by mouth 3 (three) times daily. 405 tablet 1  . donepezil (ARICEPT) 10 MG tablet Take 1 tablet (10 mg total) by mouth at bedtime. 30 tablet 3  . escitalopram (LEXAPRO) 5 MG tablet Take 1 tablet (5 mg total) by mouth daily. 90 tablet 3  . losartan-hydrochlorothiazide (HYZAAR) 100-12.5 MG tablet Take 0.5 tablets by mouth daily. 46 tablet 3  . simvastatin (ZOCOR) 20 MG tablet Take 1 tablet (20 mg total) by mouth at bedtime. 90 tablet 3   No current facility-administered medications for this visit.     Past Medical History:  Diagnosis Date  . Allergic rhinitis   . Allergy   . Anxiety   . Aortic insufficiency    mild... echo... 02/2010  . Atrial flutter (Carlos [name]) 02/03/2010   Consult by Dr. Lovena [name]... June, 2011.... plan rate control...  Coumadin not needed... ablation if rate not controlled well  . Basal cell carcinoma   . Chest pain    Nuclear, March, 2010, no ischemia  . Degenerative joint disease   . Diastolic dysfunction    EF 50-55%... echo.. 02/2010 (55-60% echo 2009)  . Dyslipidemia   . Ejection fraction    EF 50-55%, echo, June, 2011  . Hemorrhoids   . HEMORRHOIDS 10/21/2007   Qualifier: History of  By: Carlos Gilford MD,  Carlos [name]   . HTN (hypertension)   . Incomplete RBBB    IRBBB; sinus bradycardia w/ PVCs  . LVH (left ventricular hypertrophy)    moderate... echo... 02/2010  . Mitral regurgitation    mild... echo.. 02/2010  . PVC (premature ventricular contraction)   . Renal calculus   . Sinus bradycardia   . Tubular adenoma of colon 08/2009    ROS:   All systems reviewed and negative except as noted in the HPI.   Past Surgical History:  Procedure Laterality Date  . COLONOSCOPY    . KIDNEY STONE SURGERY    . POLYPECTOMY       Family History  Problem Relation Age of Onset  . Hypertension Mother   . Stroke Mother 75       lived to 18  . Other Father        parkinsons ? 84  . Healthy Child   . Colon cancer Paternal Aunt   . Colon cancer Paternal Uncle      Social History   Socioeconomic History  . Marital status: Married    Spouse name: Carlos [name]  . Number of children: 2  . Years of education: Not on file  . Highest education level: Master's degree (e.g., MA, MS, MEng, MEd, MSW, MBA)  Occupational  History  . 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 Smoking status: Never Smoker  . Smokeless tobacco: Never Used  Substance and Sexual Activity  . Alcohol use: Not Currently    Alcohol/week: 0.0 standard drinks    Comment: etoh; glass of wine one time a week   . Drug use: No  . Sexual activity: Not on file  Other Topics Concern  . Not on file  Social History Narrative   Married- wife patient of Dr. Yong [name]. Son and daughter in 85s in 2018. 20 29 year old grandchild.  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 Active with parkinsons exercise class- boxing and cycling   Social Determinants of Health   Financial Resource Strain:   . Difficulty of Paying Living Expenses:   Food Insecurity:   . Worried About Charity fundraiser  in the Last Year:   . Arboriculturist in the Last Year:   Transportation Needs:   . Film/video editor (Medical):   Marland Kitchen Lack of Transportation (Non-Medical):   Physical Activity:   . Days of Exercise per Week:   . Minutes of Exercise per Session:   Stress:   . Feeling of Stress :   Social Connections:   . Frequency of Communication with Friends and Family:   . Frequency of Social Gatherings with Friends and Family:   . Attends Religious Services:   . Active Member of Clubs or Organizations:   . Attends Archivist Meetings:   Marland Kitchen Marital Status:   Intimate Partner Violence:   . Fear of Current or Ex-Partner:   . Emotionally Abused:   Marland Kitchen Physically Abused:   . Sexually Abused:      BP 118/80   Pulse 68   Ht 6' (1.829 m)   Wt 177 lb (80.3 kg)   BMI 24.01 kg/m   Physical Exam:  Well appearing NAD HEENT: Unremarkable Neck:  No JVD, no thyromegally Lymphatics:  No adenopathy Back:  No CVA tenderness Lungs:  Clear with no wheezes HEART:  Regular rate rhythm, no murmurs, no rubs, no clicks Abd:  soft, positive bowel sounds, no organomegally, no rebound, no guarding Ext:  2 plus pulses, no edema, no cyanosis, no clubbing Skin:  No rashes no nodules Neuro:  CN II through XII intact, motor grossly intact  EKG - atrial fib with a controlled Vr.   Assess/Plan: 1. Atrial fib - his VR is well controlled. He will continue his current medical therapy with rate control. 2. Parkinson's - he has not had any falls or syncope since his last visit. He has a significant resting tremor. 3. HTN - his bp is well controlled. He will continue his current meds.  Carlos [name].D.

## 2019-11-26 ENCOUNTER — Other Ambulatory Visit: Payer: Self-pay

## 2019-11-26 DIAGNOSIS — Z85828 Personal history of other malignant neoplasm of skin: Secondary | ICD-10-CM | POA: Diagnosis not present

## 2019-11-26 DIAGNOSIS — L720 Epidermal cyst: Secondary | ICD-10-CM | POA: Diagnosis not present

## 2019-11-26 DIAGNOSIS — L821 Other seborrheic keratosis: Secondary | ICD-10-CM | POA: Diagnosis not present

## 2019-11-26 DIAGNOSIS — L578 Other skin changes due to chronic exposure to nonionizing radiation: Secondary | ICD-10-CM | POA: Diagnosis not present

## 2019-11-26 DIAGNOSIS — L570 Actinic keratosis: Secondary | ICD-10-CM | POA: Diagnosis not present

## 2019-11-26 DIAGNOSIS — Z08 Encounter for follow-up examination after completed treatment for malignant neoplasm: Secondary | ICD-10-CM | POA: Diagnosis not present

## 2019-11-26 MED ORDER — DONEPEZIL HCL 10 MG PO TABS: 10.0000 mg | ORAL_TABLET | Freq: Every day | ORAL | 3 refills | Status: DC

## 2019-12-21 ENCOUNTER — Ambulatory Visit (AMBULATORY_SURGERY_CENTER): Payer: Medicare Other | Admitting: Gastroenterology

## 2019-12-21 ENCOUNTER — Encounter: Payer: Self-pay | Admitting: Gastroenterology

## 2019-12-21 ENCOUNTER — Telehealth: Payer: Self-pay | Admitting: Gastroenterology

## 2019-12-21 ENCOUNTER — Other Ambulatory Visit: Payer: Self-pay

## 2019-12-21 VITALS — BP 102/68 | HR 64 | Temp 96.6°F | Resp 16 | Ht 72.0 in | Wt 177.0 lb

## 2019-12-21 DIAGNOSIS — Z86010 Personal history of colonic polyps: Secondary | ICD-10-CM

## 2019-12-21 DIAGNOSIS — D123 Benign neoplasm of transverse colon: Secondary | ICD-10-CM | POA: Diagnosis not present

## 2019-12-21 DIAGNOSIS — Z800 Family history of malignant neoplasm of digestive organs: Secondary | ICD-10-CM | POA: Diagnosis not present

## 2019-12-21 DIAGNOSIS — D122 Benign neoplasm of ascending colon: Secondary | ICD-10-CM | POA: Diagnosis not present

## 2019-12-21 DIAGNOSIS — D124 Benign neoplasm of descending colon: Secondary | ICD-10-CM

## 2019-12-21 DIAGNOSIS — Z1211 Encounter for screening for malignant neoplasm of colon: Secondary | ICD-10-CM | POA: Diagnosis not present

## 2019-12-21 MED ORDER — SODIUM CHLORIDE 0.9 % IV SOLN: 500.0000 mL | Freq: Once | INTRAVENOUS | Status: DC

## 2019-12-21 NOTE — Telephone Encounter (Signed)
Pt's wife reported that pt is experiencing stringy blood clots with bms post procedure.

## 2019-12-21 NOTE — Progress Notes (Signed)
Called to room to assist during endoscopic procedure.  Patient ID and intended procedure confirmed with present staff. Received instructions for my participation in the procedure from the performing physician.  

## 2019-12-21 NOTE — Progress Notes (Signed)
pt tolerated well. VSS. awake and to recovery. Report given to RN.  

## 2019-12-21 NOTE — Op Note (Signed)
[facility] Patient Name: Carlos [name] Procedure Date: 12/21/2019 11:13 AM MRN: [id number] Endoscopist: Ladene Artist , MD Age: 79 Referring MD:  Date of Birth: 15-Oct-1940 Gender: Male Account #: [id number] Procedure:                Colonoscopy Indications:              Surveillance: Personal history of adenomatous                            polyps on last colonoscopy > 5 years ago. Family                            history of colon cancer, multiple second degree                            relatives. Medicines:                Monitored Anesthesia Care Procedure:                Pre-Anesthesia Assessment:                           - Prior to the procedure, a History and Physical                            was performed, and patient medications and                            allergies were reviewed. The patient's tolerance of                            previous anesthesia was also reviewed. The risks                            and benefits of the procedure and the sedation                            options and risks were discussed with the patient.                            All questions were answered, and informed consent                            was obtained. Prior Anticoagulants: The patient has                            taken Eliquis (apixaban), last dose was 2 days                            prior to procedure. ASA Grade Assessment: III - A                            patient with severe systemic disease. After  reviewing the risks and benefits, the patient was                            deemed in satisfactory condition to undergo the                            procedure.                           After obtaining informed consent, the colonoscope                            was passed under direct vision. Throughout the                            procedure, the patient's blood pressure, pulse, and                            oxygen  saturations were monitored continuously. The                            Colonoscope was introduced through the anus and                            advanced to the the cecum, identified by                            appendiceal orifice and ileocecal valve. The                            ileocecal valve, appendiceal orifice, and rectum                            were photographed. The quality of the bowel                            preparation was good. The colonoscopy was performed                            without difficulty. The patient tolerated the                            procedure well. Scope In: 11:12:46 AM Scope Out: 11:30:11 AM Scope Withdrawal Time: 0 hours 14 minutes 58 seconds  Total Procedure Duration: 0 hours 17 minutes 25 seconds  Findings:                 The perianal and digital rectal examinations were                            normal.                           A 15 mm polyp was found in the ascending colon. The  polyp was sessile. The polyp was removed with a hot                            snare. Resection and retrieval were complete.                           Two sessile polyps were found in the descending                            colon and transverse colon. The polyps were 6 to 9                            mm in size. These polyps were removed with a cold                            snare. Resection and retrieval were complete.                           A few medium-mouthed diverticula were found in the                            left colon. There was no evidence of diverticular                            bleeding.                           Internal hemorrhoids were found during                            retroflexion. The hemorrhoids were small and Grade                            I (internal hemorrhoids that do not prolapse).                           The exam was otherwise without abnormality on                            direct and  retroflexion views. Complications:            No immediate complications. Estimated blood loss:                            None. Estimated Blood Loss:     Estimated blood loss: none. Impression:               - One 15 mm polyp in the ascending colon, removed                            with a hot snare. Resected and retrieved.                           - Two 6 to 9 mm polyps in the descending colon and  in the transverse colon, removed with a cold snare.                            Resected and retrieved.                           - Mild diverticulosis in the left colon.                           - Internal hemorrhoids.                           - The examination was otherwise normal on direct                            and retroflexion views. Recommendation:           - Resume Eliquis (apixaban) in 3 days at prior                            dose. Refer to managing physician for further                            adjustment of therapy.                           - Patient has a contact number available for                            emergencies. The signs and symptoms of potential                            delayed complications were discussed with the                            patient. Return to normal activities tomorrow.                            Written discharge instructions were provided to the                            patient.                           - Resume previous diet.                           - Continue present medications.                           - Await pathology results.                           - No aspirin, ibuprofen, naproxen, or other                            non-steroidal anti-inflammatory drugs for 2  weeks                            after polyp removal.                           - No repeat colonoscopy due to age. Ladene Artist, MD 12/21/2019 11:34:50 AM This report has been signed electronically.

## 2019-12-21 NOTE — Patient Instructions (Signed)
Handouts provided on polyps, diverticulosis, and hemorrhoids.  Resume Eliquis (apixaban) in 3 days at prior dose, on 12/25/19.  No aspirin, ibuprofen, naproxen, or other non-steriodal anti-inflammatory drugs for 2 weeks after polyp removal.   YOU HAD AN ENDOSCOPIC PROCEDURE TODAY AT [facility]:   Refer to the procedure report that was given to you for any specific questions about what was found during the examination.  If the procedure report does not answer your questions, please call your gastroenterologist to clarify.  If you requested that your care partner not be given the details of your procedure findings, then the procedure report has been included in a sealed envelope for you to review at your convenience later.  YOU SHOULD EXPECT: Some feelings of bloating in the abdomen. Passage of more gas than usual.  Walking can help get rid of the air that was put into your GI tract during the procedure and reduce the bloating. If you had a lower endoscopy (such as a colonoscopy or flexible sigmoidoscopy) you may notice spotting of blood in your stool or on the toilet paper. If you underwent a bowel prep for your procedure, you may not have a normal bowel movement for a few days.  Please Note:  You might notice some irritation and congestion in your nose or some drainage.  This is from the oxygen used during your procedure.  There is no need for concern and it should clear up in a day or so.  SYMPTOMS TO REPORT IMMEDIATELY:   Following lower endoscopy (colonoscopy or flexible sigmoidoscopy):  Excessive amounts of blood in the stool  Significant tenderness or worsening of abdominal pains  Swelling of the abdomen that is new, acute  Fever of 100F or higher   Following upper endoscopy (EGD)  Vomiting of blood or coffee ground material  New chest pain or pain under the shoulder blades  Painful or persistently difficult swallowing  New shortness of breath  Fever of 100F or  higher  Black, tarry-looking stools  For urgent or emergent issues, a gastroenterologist can be reached at any hour by calling [phone]. Do not use MyChart messaging for urgent concerns.    DIET:  We do recommend a small meal at first, but then you may proceed to your regular diet.  Drink plenty of fluids but you should avoid alcoholic beverages for 24 hours.  ACTIVITY:  You should plan to take it easy for the rest of today and you should NOT DRIVE or use heavy machinery until tomorrow (because of the sedation medicines used during the test).    FOLLOW UP: Our staff will call the number listed on your records 48-72 hours following your procedure to check on you and address any questions or concerns that you may have regarding the information given to you following your procedure. If we do not reach you, we will leave a message.  We will attempt to reach you two times.  During this call, we will ask if you have developed any symptoms of COVID 19. If you develop any symptoms (ie: fever, flu-like symptoms, shortness of breath, cough etc.) before then, please call [phone].  If you test positive for Covid 19 in the 2 weeks post procedure, please call and report this information to [place].    If any biopsies were taken you will be contacted by phone or by letter within the next 1-3 weeks.  Please call us at [phone] if you have not heard about the biopsies  in 3 weeks.    SIGNATURES/CONFIDENTIALITY: You and/or your care partner have signed paperwork which will be entered into your electronic medical record.  These signatures attest to the fact that that the information above on your After Visit Summary has been reviewed and is understood.  Full responsibility of the confidentiality of this discharge information lies with you and/or your care-partner.

## 2019-12-21 NOTE — Telephone Encounter (Signed)
Observe for now and rest at home. If bleeding persist please call us immediately. See colonoscopy report for instructions on avoiding NSAIDs and resuming Eliquis.

## 2019-12-22 ENCOUNTER — Inpatient Hospital Stay (HOSPITAL_COMMUNITY): Payer: Medicare Other | Admitting: Anesthesiology

## 2019-12-22 ENCOUNTER — Other Ambulatory Visit: Payer: Self-pay

## 2019-12-22 ENCOUNTER — Emergency Department (HOSPITAL_COMMUNITY): Payer: Medicare Other

## 2019-12-22 ENCOUNTER — Inpatient Hospital Stay (HOSPITAL_COMMUNITY)
Admission: EM | Admit: 2019-12-22 | Discharge: 2019-12-23 | DRG: 920 | Disposition: A | Discharge status: Home / Self Care | Payer: Medicare Other | Attending: Internal Medicine | Admitting: Internal Medicine

## 2019-12-22 ENCOUNTER — Encounter (HOSPITAL_COMMUNITY)
Admission: EM | Disposition: A | Discharge status: Home / Self Care | Payer: Self-pay | Source: Home / Self Care | Attending: Internal Medicine

## 2019-12-22 ENCOUNTER — Encounter (HOSPITAL_COMMUNITY): Payer: Self-pay | Admitting: Emergency Medicine

## 2019-12-22 DIAGNOSIS — Z8249 Family history of ischemic heart disease and other diseases of the circulatory system: Secondary | ICD-10-CM

## 2019-12-22 DIAGNOSIS — Z823 Family history of stroke: Secondary | ICD-10-CM | POA: Diagnosis not present

## 2019-12-22 DIAGNOSIS — I517 Cardiomegaly: Secondary | ICD-10-CM | POA: Diagnosis not present

## 2019-12-22 DIAGNOSIS — I493 Ventricular premature depolarization: Secondary | ICD-10-CM | POA: Diagnosis not present

## 2019-12-22 DIAGNOSIS — Z20822 Contact with and (suspected) exposure to covid-19: Secondary | ICD-10-CM | POA: Diagnosis present

## 2019-12-22 DIAGNOSIS — I959 Hypotension, unspecified: Secondary | ICD-10-CM | POA: Diagnosis not present

## 2019-12-22 DIAGNOSIS — K922 Gastrointestinal hemorrhage, unspecified: Secondary | ICD-10-CM | POA: Diagnosis present

## 2019-12-22 DIAGNOSIS — I480 Paroxysmal atrial fibrillation: Secondary | ICD-10-CM | POA: Diagnosis not present

## 2019-12-22 DIAGNOSIS — I5032 Chronic diastolic (congestive) heart failure: Secondary | ICD-10-CM | POA: Diagnosis not present

## 2019-12-22 DIAGNOSIS — I4891 Unspecified atrial fibrillation: Secondary | ICD-10-CM | POA: Diagnosis not present

## 2019-12-22 DIAGNOSIS — Y838 Other surgical procedures as the cause of abnormal reaction of the patient, or of later complication, without mention of misadventure at the time of the procedure: Secondary | ICD-10-CM | POA: Diagnosis present

## 2019-12-22 DIAGNOSIS — I4892 Unspecified atrial flutter: Secondary | ICD-10-CM | POA: Diagnosis present

## 2019-12-22 DIAGNOSIS — Z87442 Personal history of urinary calculi: Secondary | ICD-10-CM

## 2019-12-22 DIAGNOSIS — K5730 Diverticulosis of large intestine without perforation or abscess without bleeding: Secondary | ICD-10-CM | POA: Diagnosis present

## 2019-12-22 DIAGNOSIS — Z800 Family history of malignant neoplasm of digestive organs: Secondary | ICD-10-CM | POA: Diagnosis not present

## 2019-12-22 DIAGNOSIS — I10 Essential (primary) hypertension: Secondary | ICD-10-CM | POA: Diagnosis not present

## 2019-12-22 DIAGNOSIS — Z79899 Other long term (current) drug therapy: Secondary | ICD-10-CM

## 2019-12-22 DIAGNOSIS — I110 Hypertensive heart disease with heart failure: Secondary | ICD-10-CM | POA: Diagnosis not present

## 2019-12-22 DIAGNOSIS — Z7901 Long term (current) use of anticoagulants: Secondary | ICD-10-CM

## 2019-12-22 DIAGNOSIS — E785 Hyperlipidemia, unspecified: Secondary | ICD-10-CM | POA: Diagnosis not present

## 2019-12-22 DIAGNOSIS — E876 Hypokalemia: Secondary | ICD-10-CM | POA: Diagnosis not present

## 2019-12-22 DIAGNOSIS — K625 Hemorrhage of anus and rectum: Secondary | ICD-10-CM | POA: Diagnosis not present

## 2019-12-22 DIAGNOSIS — D696 Thrombocytopenia, unspecified: Secondary | ICD-10-CM | POA: Diagnosis not present

## 2019-12-22 DIAGNOSIS — K91840 Postprocedural hemorrhage and hematoma of a digestive system organ or structure following a digestive system procedure: Secondary | ICD-10-CM | POA: Diagnosis not present

## 2019-12-22 DIAGNOSIS — I351 Nonrheumatic aortic (valve) insufficiency: Secondary | ICD-10-CM | POA: Diagnosis not present

## 2019-12-22 DIAGNOSIS — Z66 Do not resuscitate: Secondary | ICD-10-CM | POA: Diagnosis present

## 2019-12-22 DIAGNOSIS — G20 Parkinson's disease: Secondary | ICD-10-CM | POA: Diagnosis not present

## 2019-12-22 HISTORY — PX: COLONOSCOPY WITH PROPOFOL: SHX5780

## 2019-12-22 HISTORY — PX: HEMOSTASIS CLIP PLACEMENT: SHX6857

## 2019-12-22 LAB — RESPIRATORY PANEL BY RT PCR (FLU A&B, COVID)
Influenza A by PCR: NEGATIVE
Influenza B by PCR: NEGATIVE
SARS Coronavirus 2 by RT PCR: NEGATIVE

## 2019-12-22 LAB — CBC
HCT: 49.3 % (ref 39.0–52.0)
Hemoglobin: 16 g/dL (ref 13.0–17.0)
MCH: 30.7 pg (ref 26.0–34.0)
MCHC: 32.5 g/dL (ref 30.0–36.0)
MCV: 94.4 fL (ref 80.0–100.0)
Platelets: 112 10*3/uL — ABNORMAL LOW (ref 150–400)
RBC: 5.22 MIL/uL (ref 4.22–5.81)
RDW: 13.2 % (ref 11.5–15.5)
WBC: 8.2 10*3/uL (ref 4.0–10.5)
nRBC: 0 % (ref 0.0–0.2)

## 2019-12-22 LAB — COMPREHENSIVE METABOLIC PANEL
ALT: <5 U/L (ref 0–44)
AST: 20 U/L (ref 15–41)
Albumin: 4.2 g/dL (ref 3.5–5.0)
Alkaline Phosphatase: 72 U/L (ref 38–126)
Anion gap: 10 (ref 5–15)
BUN: 17 mg/dL (ref 8–23)
CO2: 27 mmol/L (ref 22–32)
Calcium: 9.3 mg/dL (ref 8.9–10.3)
Chloride: 103 mmol/L (ref 98–111)
Creatinine, Ser: 0.85 mg/dL (ref 0.61–1.24)
GFR calc Af Amer: >60 mL/min (ref >60)
GFR calc non Af Amer: >60 mL/min (ref >60)
Glucose, Bld: 103 mg/dL — ABNORMAL HIGH (ref 70–99)
Potassium: 3.2 mmol/L — ABNORMAL LOW (ref 3.5–5.1)
Sodium: 140 mmol/L (ref 135–145)
Total Bilirubin: 1.7 mg/dL — ABNORMAL HIGH (ref 0.3–1.2)
Total Protein: 6.8 g/dL (ref 6.5–8.1)

## 2019-12-22 LAB — HEMOGLOBIN: Hemoglobin: 13.4 g/dL (ref 13.0–17.0)

## 2019-12-22 LAB — PROTIME-INR
INR: 1.1 (ref 0.8–1.2)
Prothrombin Time: 14.1 seconds (ref 11.4–15.2)

## 2019-12-22 LAB — LACTIC ACID, PLASMA: Lactic Acid, Venous: 1.9 mmol/L (ref 0.5–1.9)

## 2019-12-22 LAB — ABO/RH: ABO/RH(D): AB POS

## 2019-12-22 LAB — HEMOGLOBIN AND HEMATOCRIT, BLOOD
HCT: 43.1 % (ref 39.0–52.0)
Hemoglobin: 14.2 g/dL (ref 13.0–17.0)

## 2019-12-22 LAB — PREPARE RBC (CROSSMATCH)

## 2019-12-22 SURGERY — COLONOSCOPY WITH PROPOFOL
Anesthesia: Monitor Anesthesia Care

## 2019-12-22 MED ORDER — MAGNESIUM HYDROXIDE 400 MG/5ML PO SUSP: 30.0000 mL | Freq: Every day | ORAL | Status: DC | PRN

## 2019-12-22 MED ORDER — CARBIDOPA-LEVODOPA 25-100 MG PO TABS: 1.5000 | ORAL_TABLET | Freq: Three times a day (TID) | ORAL | Status: DC

## 2019-12-22 MED ORDER — LIDOCAINE 2% (20 MG/ML) 5 ML SYRINGE
INTRAMUSCULAR | Status: DC | PRN
  Administered 2019-12-22: 60 mg via INTRAVENOUS

## 2019-12-22 MED ORDER — PANTOPRAZOLE SODIUM 40 MG IV SOLR
40.0000 mg | Freq: Once | INTRAVENOUS | Status: AC
  Administered 2019-12-22: 40 mg via INTRAVENOUS
  Filled 2019-12-22: qty 40

## 2019-12-22 MED ORDER — ESCITALOPRAM OXALATE 10 MG PO TABS
5.0000 mg | ORAL_TABLET | Freq: Every day | ORAL | Status: DC
  Administered 2019-12-23: 5 mg via ORAL
  Filled 2019-12-22: qty 1

## 2019-12-22 MED ORDER — SODIUM CHLORIDE 0.9% IV SOLUTION: Freq: Once | INTRAVENOUS | Status: AC

## 2019-12-22 MED ORDER — CARBIDOPA-LEVODOPA ER 50-200 MG PO TBCR
1.0000 | EXTENDED_RELEASE_TABLET | Freq: Every day | ORAL | Status: DC
  Administered 2019-12-22: 1 via ORAL
  Filled 2019-12-22 (×2): qty 1

## 2019-12-22 MED ORDER — PROPOFOL 10 MG/ML IV BOLUS
INTRAVENOUS | Status: DC | PRN
  Administered 2019-12-22: 40 mg via INTRAVENOUS

## 2019-12-22 MED ORDER — ATENOLOL 25 MG PO TABS
25.0000 mg | ORAL_TABLET | Freq: Every day | ORAL | Status: DC
  Administered 2019-12-22: 25 mg via ORAL
  Filled 2019-12-22 (×2): qty 1

## 2019-12-22 MED ORDER — PEG-KCL-NACL-NASULF-NA ASC-C 100 G PO SOLR
0.5000 | Freq: Once | ORAL | Status: AC
  Administered 2019-12-22: 100 g via ORAL
  Filled 2019-12-22: qty 1

## 2019-12-22 MED ORDER — POTASSIUM CHLORIDE 10 MEQ/100ML IV SOLN
10.0000 meq | INTRAVENOUS | Status: AC
  Administered 2019-12-22 (×2): 10 meq via INTRAVENOUS
  Filled 2019-12-22 (×2): qty 100

## 2019-12-22 MED ORDER — PROPOFOL 500 MG/50ML IV EMUL
INTRAVENOUS | Status: DC | PRN
  Administered 2019-12-22: 125 ug/kg/min via INTRAVENOUS

## 2019-12-22 MED ORDER — SODIUM CHLORIDE 0.9 % IV SOLN: INTRAVENOUS | Status: DC

## 2019-12-22 MED ORDER — SODIUM CHLORIDE 0.9% FLUSH
3.0000 mL | Freq: Two times a day (BID) | INTRAVENOUS | Status: DC
  Administered 2019-12-22: 3 mL via INTRAVENOUS

## 2019-12-22 MED ORDER — ONDANSETRON HCL 4 MG/2ML IJ SOLN
4.0000 mg | Freq: Four times a day (QID) | INTRAMUSCULAR | Status: DC | PRN
  Administered 2019-12-22: 4 mg via INTRAVENOUS
  Filled 2019-12-22: qty 2

## 2019-12-22 MED ORDER — TRANEXAMIC ACID-NACL 1000-0.7 MG/100ML-% IV SOLN
1000.0000 mg | Freq: Once | INTRAVENOUS | Status: AC
  Administered 2019-12-22: 1000 mg via INTRAVENOUS
  Filled 2019-12-22: qty 100

## 2019-12-22 MED ORDER — ONDANSETRON HCL 4 MG PO TABS: 4.0000 mg | ORAL_TABLET | Freq: Four times a day (QID) | ORAL | Status: DC | PRN

## 2019-12-22 MED ORDER — SORBITOL 70 % SOLN
30.0000 mL | Freq: Every day | Status: DC | PRN
  Filled 2019-12-22: qty 30

## 2019-12-22 MED ORDER — PHENYLEPHRINE 40 MCG/ML (10ML) SYRINGE FOR IV PUSH (FOR BLOOD PRESSURE SUPPORT)
PREFILLED_SYRINGE | INTRAVENOUS | Status: DC | PRN
  Administered 2019-12-22 (×5): 80 ug via INTRAVENOUS

## 2019-12-22 MED ORDER — SENNA 8.6 MG PO TABS
1.0000 | ORAL_TABLET | Freq: Two times a day (BID) | ORAL | Status: DC
  Administered 2019-12-22 – 2019-12-23 (×2): 8.6 mg via ORAL
  Filled 2019-12-22 (×2): qty 1

## 2019-12-22 MED ORDER — DONEPEZIL HCL 5 MG PO TABS
10.0000 mg | ORAL_TABLET | Freq: Every day | ORAL | Status: DC
  Administered 2019-12-22: 10 mg via ORAL
  Filled 2019-12-22: qty 2

## 2019-12-22 MED ORDER — CARBIDOPA-LEVODOPA 25-100 MG PO TABS
1.5000 | ORAL_TABLET | Freq: Three times a day (TID) | ORAL | Status: DC
  Administered 2019-12-22 – 2019-12-23 (×3): 1.5 via ORAL
  Filled 2019-12-22 (×3): qty 2

## 2019-12-22 MED ORDER — PEG-KCL-NACL-NASULF-NA ASC-C 100 G PO SOLR
0.5000 | Freq: Once | ORAL | Status: DC
  Filled 2019-12-22: qty 1

## 2019-12-22 MED ORDER — SIMVASTATIN 20 MG PO TABS
20.0000 mg | ORAL_TABLET | Freq: Every day | ORAL | Status: DC
  Administered 2019-12-22: 20 mg via ORAL
  Filled 2019-12-22: qty 1

## 2019-12-22 MED ORDER — SODIUM CHLORIDE 0.9 % IV BOLUS
1000.0000 mL | Freq: Once | INTRAVENOUS | Status: AC
  Administered 2019-12-22: 1000 mL via INTRAVENOUS

## 2019-12-22 MED ORDER — PEG-KCL-NACL-NASULF-NA ASC-C 100 G PO SOLR: 0.5000 | Freq: Once | ORAL | Status: DC

## 2019-12-22 MED ORDER — LACTATED RINGERS IV SOLN
INTRAVENOUS | Status: DC
  Administered 2019-12-22: 13:00:00 1000 mL via INTRAVENOUS

## 2019-12-22 MED ORDER — PROPOFOL 500 MG/50ML IV EMUL
INTRAVENOUS | Status: AC
  Filled 2019-12-22: qty 50

## 2019-12-22 SURGICAL SUPPLY — 22 items

## 2019-12-22 NOTE — ED Notes (Signed)
Will recheck Hgb once blood administration is completed.

## 2019-12-22 NOTE — ED Notes (Signed)
X-ray at bedside

## 2019-12-22 NOTE — ED Provider Notes (Signed)
[facility] DEPT Provider Note   CSN: [id number] Arrival date & time: 12/22/19  0221     History Chief Complaint  Patient presents with  . Rectal Bleeding    Carlos [name] is a 79 y.o. male.  Patient here with rectal bleeding after colonoscopy at 77 AM yesterday.  States he had 3 polyps removed.  When he went home he had some "stringy red stools.  His wife reports since then he said to episodes of large-volume blood mixed with red and maroon blood.  On arrival he had a 400 cc bloody output with no stool.  He states he has minimal pain.  He denies any dizziness or lightheadedness.  No chest pain or shortness of breath.  No nausea or vomiting.  He does take Eliquis but has not taken any for the past 3 days and has not restarted it since his colonoscopy.  He called his GI doctor and was told to come to the hospital if things did not improve.  He denies feeling dizzy or lightheaded.  He denies any significant abdominal pain at this time.  The history is provided by the patient and a relative.  Rectal Bleeding Associated symptoms: no abdominal pain, no dizziness, no fever and no vomiting        Past Medical History:  Diagnosis Date  . Allergic rhinitis   . Allergy   . Anxiety   . Aortic insufficiency    mild... echo... 02/2010  . Atrial flutter ([place]) 02/03/2010   Consult by Dr. Lovena Le... June, 2011.... plan rate control...  Coumadin not needed... ablation if rate not controlled well  . Basal cell carcinoma   . Chest pain    Nuclear, March, 2010, no ischemia  . Degenerative joint disease   . Diastolic dysfunction    EF 50-55%... echo.. 02/2010 (55-60% echo 2009)  . Dyslipidemia   . Ejection fraction    EF 50-55%, echo, June, 2011  . Hemorrhoids   . HEMORRHOIDS 10/21/2007   Qualifier: History of  By: [name], [name]   . HTN (hypertension)   . Incomplete RBBB    IRBBB; sinus bradycardia w/ PVCs  . LVH (left ventricular hypertrophy)    moderate...  echo... 02/2010  . Mitral regurgitation    mild... echo.. 02/2010  . PVC (premature ventricular contraction)   . Renal calculus   . Sinus bradycardia   . Tubular adenoma of colon 08/2009    Patient Active Problem List   Diagnosis Date Noted  . Thrombocytopenia ([place]) 09/29/2019  . Primary osteoarthritis of right hip 06/19/2018  . Allergy 12/07/2016  . Parkinson's disease ([place]) 03/09/2016  . Sinus node dysfunction ([place]) 08/02/2015  . Aortic insufficiency 07/20/2015  . Hx of colonic polyps 01/13/2013  . Combined forms of age-related cataract 12/17/2012  . Presbyopia 12/17/2012  . Ptosis of eyelid 12/17/2012  . Vitreous syneresis 12/17/2012  . Incomplete RBBB   . Chest pain   . Essential hypertension   . Mitral regurgitation   . LVH (left ventricular hypertrophy)   . Diastolic dysfunction   . Sinus bradycardia   . PVC (premature ventricular contraction)   . Dyslipidemia   . PAF (paroxysmal atrial fibrillation) ([place]) 02/03/2010  . Allergic rhinitis 12/28/2009  . History of basal cell carcinoma of skin 10/26/2008  . Anxiety state 10/21/2007  . Osteoarthritis 10/21/2007  . RENAL CALCULUS, HX OF 10/20/2007    Past Surgical History:  Procedure Laterality Date  . COLONOSCOPY    .  KIDNEY STONE SURGERY    . POLYPECTOMY         Family History  Problem Relation Age of Onset  . Hypertension Mother   . Stroke Mother 56       lived to 98  . Other Father        parkinsons ? 84  . Healthy Child   . Colon cancer Paternal Aunt   . Colon cancer Paternal Uncle     Social History   Tobacco Use  . Smoking status: Never Smoker  . Smokeless tobacco: Never Used  Substance Use Topics  . Alcohol use: Not Currently    Alcohol/week: 0.0 standard drinks    Comment: etoh; glass of wine one time a week   . Drug use: No    Home Medications Prior to Admission medications   Medication Sig Start Date End Date Taking? Authorizing Provider  apixaban (ELIQUIS) 5 MG TABS tablet Take 1  tablet (5 mg total) by mouth 2 (two) times daily. 02/11/19   [name], MD  atenolol (TENORMIN) 25 MG tablet TAKE 1 TABLET BY MOUTH DAILY 07/09/19   [name], MD  carbidopa-levodopa (SINEMET CR) 50-200 MG tablet Take 1 tablet by mouth at bedtime. 10/27/19   Tat, Eustace Quail, DO  carbidopa-levodopa (SINEMET IR) 25-100 MG tablet Take 1.5 tablets by mouth 3 (three) times daily. 10/27/19   Tat, Eustace Quail, DO  donepezil (ARICEPT) 10 MG tablet Take 1 tablet (10 mg total) by mouth at bedtime. 11/26/19   Tat, Eustace Quail, DO  escitalopram (LEXAPRO) 5 MG tablet Take 1 tablet (5 mg total) by mouth daily. 07/23/19   Tat, Eustace Quail, DO  losartan-hydrochlorothiazide (HYZAAR) 100-12.5 MG tablet Take 0.5 tablets by mouth daily. 01/30/19   [name], MD  simvastatin (ZOCOR) 20 MG tablet Take 1 tablet (20 mg total) by mouth at bedtime. 09/29/19   [name], MD    Allergies    Patient has no known allergies.  Review of Systems   Review of Systems  Constitutional: Positive for fatigue. Negative for appetite change and fever.  HENT: Negative for congestion and rhinorrhea.   Respiratory: Negative for cough, chest tightness and shortness of breath.   Gastrointestinal: Positive for blood in stool and hematochezia. Negative for abdominal pain, nausea and vomiting.  Genitourinary: Negative for dysuria and hematuria.  Musculoskeletal: Negative for arthralgias and myalgias.  Skin: Negative for rash.  Neurological: Positive for weakness. Negative for dizziness and headaches.   all other systems are negative except as noted in the HPI and PMH.    Physical Exam Updated Vital Signs BP 129/86 (BP Location: Right Arm)   Pulse (!) 53   Temp 98.3 F (36.8 C) (Oral)   Resp 16   Ht 6' (1.829 m)   Wt 80.3 kg   SpO2 96%   BMI 24.01 kg/m   Physical Exam Vitals and nursing note reviewed.  Constitutional:      General: He is not in acute distress.    Appearance: He is well-developed.      Comments: Mildly tachypnea  HENT:     Head: Normocephalic and atraumatic.     Mouth/Throat:     Pharynx: No oropharyngeal exudate.  Eyes:     Conjunctiva/sclera: Conjunctivae normal.     Pupils: Pupils are equal, round, and reactive to light.  Neck:     Comments: No meningismus. Cardiovascular:     Rate and Rhythm: Normal rate and regular rhythm.  Heart sounds: Normal heart sounds. No murmur.  Pulmonary:     Effort: Pulmonary effort is normal. No respiratory distress.     Breath sounds: Normal breath sounds.  Abdominal:     Palpations: Abdomen is soft.     Tenderness: There is no abdominal tenderness. There is no guarding or rebound.  Genitourinary:    Comments: Gross blood per rectum Musculoskeletal:        General: No tenderness. Normal range of motion.     Cervical back: Normal range of motion and neck supple.  Skin:    General: Skin is warm.  Neurological:     Mental Status: He is alert.     Cranial Nerves: No cranial nerve deficit.     Motor: No abnormal muscle tone.     Coordination: Coordination normal.     Comments: Oriented to person and place, parkinsonian tremor bilateral  Psychiatric:        Behavior: Behavior normal.     ED Results / Procedures / Treatments   Labs (all labs ordered are listed, but only abnormal results are displayed) Labs Reviewed  COMPREHENSIVE METABOLIC PANEL - Abnormal; Notable for the following components:      Result Value   Potassium 3.2 (*)    Glucose, Bld 103 (*)    Total Bilirubin 1.7 (*)    All other components within normal limits  CBC - Abnormal; Notable for the following components:   Platelets 112 (*)    All other components within normal limits  RESPIRATORY PANEL BY RT PCR (FLU A&B, COVID)  PROTIME-INR  LACTIC ACID, PLASMA  HEMOGLOBIN AND HEMATOCRIT, BLOOD  POC OCCULT BLOOD, ED  TYPE AND SCREEN  ABO/RH  PREPARE RBC (CROSSMATCH)    EKG EKG Interpretation  Date/Time:  Tuesday December 22 2019 03:49:28  EDT Ventricular Rate:  93 PR Interval:    QRS Duration: 103 QT Interval:  393 QTC Calculation: 489 R Axis:   -35 Text Interpretation: Atrial fibrillation Left axis deviation RSR' in V1 or V2, right VCD or RVH Borderline prolonged QT interval Rate faster Confirmed by Ezequiel Essex [phone]) on 12/22/2019 3:52:46 AM   Radiology DG Chest Portable 1 View  Result Date: 12/22/2019 CLINICAL DATA:  Rectal bleeding following colonoscopy. EXAM: PORTABLE CHEST 1 VIEW COMPARISON:  One-view chest x-ray 01/17/2015 FINDINGS: Heart is enlarged. Atherosclerotic changes are noted at the arch. No edema or effusion is present. No focal airspace disease is present. Axial skeleton is within limits. IMPRESSION: 1. Cardiomegaly without failure. 2. Atherosclerosis. Electronically Signed   By: San Morelle M.D.   On: 12/22/2019 04:12   DG Abd Portable 2 Views  Result Date: 12/22/2019 CLINICAL DATA:  Rectal bleeding following colonoscopy. EXAM: PORTABLE ABDOMEN - 2 VIEW COMPARISON:  CT of the abdomen and pelvis 03/22/2013. FINDINGS: Gas is noted throughout the colon. No free air is present. No obstruction is present. Fluid layers within the colon. Degenerative changes are present in the lower lumbar spine with rightward curvature. IMPRESSION: Normal bowel gas pattern following colonoscopy without evidence for obstruction or free air. Electronically Signed   By: San Morelle M.D.   On: 12/22/2019 04:14    Procedures .Critical Care Performed by: Ezequiel Essex, MD Authorized by: Ezequiel Essex, MD   Critical care provider statement:    Critical care time (minutes):  45   Critical care was necessary to treat or prevent imminent or life-threatening deterioration of the following conditions:  Shock (GI bleeding)   Critical care was time spent personally  by me on the following activities:  Discussions with consultants, evaluation of patient's response to treatment, examination of patient, ordering and  performing treatments and interventions, ordering and review of laboratory studies, ordering and review of radiographic studies, pulse oximetry, re-evaluation of patient's condition, obtaining history from patient or surrogate and review of old charts   (including critical care time)  Medications Ordered in ED Medications - No data to display  ED Course  I have reviewed the triage vital signs and the nursing notes.  Pertinent labs & imaging results that were available during my care of the patient were reviewed by me and considered in my medical decision making (see chart for details).    MDM Rules/Calculators/A&P                     Patient with rectal bleeding after colonoscopy today.  Vitals are stable.  He has no significant abdominal pain.  IV access established.  Blood work will be sent.  He is agreeable to transfusion if necessary.  Colonoscopy showed: One 15 mm polyp in the ascending colon, removed with a hot snare. Resected and retrieved. - Two 6 to 9 mm polyps in the descending colon and in the transverse colon, removed with a cold snare. Resected and retrieved. - Mild diverticulosis in the left colon. - Internal hemorrhoids. - The examination was otherwise normal on direct and retroflexion views.   Patient had several large-volume episodes of rectal bleeding while in the ED.  Remains hemodynamically stable.  Hemoglobin is stable at 16.  He denies any pain.  X-rays are negative.  Discussed with Dr. Havery Moros on call for Dr. Fuller Plan.  He requests hospitalist admission and plan for repeat endoscopy today.  Request one half dose of MoviPrep. He does not feel that this will worsen rectal bleeding.   Patient remains hemodynamically stable in the ED but continues to have large-volume rectal bleeding.  His blood pressure is in the Q000111Q and Q000111Q systolic.  Heart rate is in the 80s.  Repeat CBC is sent. Packed red blood cells are ordered given his amount of large-volume rectal  bleeding.  He denies pain.  Denies dizziness or lightheadedness.  GI has not yet seen patient.  Admission discussed with Dr. Hal Hope.  Patient has stable vital signs. Repeat hemoglobin has dropped from 16 to 14.  Blood transfusion has been ordered to be prepared.  Final Clinical Impression(s) / ED Diagnoses Final diagnoses:  Rectal bleeding    Rx / DC Orders ED Discharge Orders    None       Roxy Mastandrea, Annie Main, MD 12/22/19 [phone]

## 2019-12-22 NOTE — H&P (Signed)
[facility] History and Physical  ROMI EDGECOMB [id number] DOB: Aug 06, 1941 DOA: 12/22/2019 PCP: [name], MD  Admitted from: Home Chief Complaint: Bright red blood per rectum  History of Present Illness: Carlos [name] is a 79 y.o. male with PMH significant for HTN, HLD, A-flutter on Eliquis, diastolic CHF, Parkinson's disease, colon adenoma who presented to the ED early this morning with complaint of rectal bleeding after colonoscopy. Patient had an outpatient colonoscopy at 11 AM yesterday 4/19.  He had 3 polyps removed.  When he went home, he had some stringy red stools around 4 PM.  Family called GI office and was told that if it worsened, he needed to go to the ED.  Around midnight, he then had another large maroon/blood mixed BM and hence presented to the ED.  Of note, Eliquis has been on hold for last 3 days prior to colonoscopy and patient has not yet resumed it.  In the ED, patient was afebrile, heart rate mostly 90s, breathing on room air, blood pressure mostly elevated to 130s. This morning, around 7 AM, patient had another large bloody stool.  Blood work showed hemoglobin initially at 16 which dropped to 14.2 in 2 hours.  INR 1.1.  Platelets 112. 1 unit of PRBC transfusion was given in ED. GI consultation was obtained.  Patient is planned for colonoscopy this afternoon. Hospitalist services involved for inpatient admission and management.  At the time of my evaluation, patient was propped up in bed.  Able to give brief history.  Wife at bedside who gives the details. Waiting for colonoscopy this afternoon.   Review of Systems:  All systems were reviewed and were negative unless otherwise mentioned in the HPI   Past medical history: Past Medical History:  Diagnosis Date  . Allergic rhinitis   . Allergy   . Anxiety   . Aortic insufficiency    mild... echo... 02/2010  . Atrial flutter ([place]) 02/03/2010   Consult by Dr. Lovena Le... June, 2011.... plan rate  control...  Coumadin not needed... ablation if rate not controlled well  . Basal cell carcinoma   . Chest pain    Nuclear, March, 2010, no ischemia  . Degenerative joint disease   . Diastolic dysfunction    EF 50-55%... echo.. 02/2010 (55-60% echo 2009)  . Dyslipidemia   . Ejection fraction    EF 50-55%, echo, June, 2011  . Hemorrhoids   . HEMORRHOIDS 10/21/2007   Qualifier: History of  By: [name], [name]   . HTN (hypertension)   . Incomplete RBBB    IRBBB; sinus bradycardia w/ PVCs  . LVH (left ventricular hypertrophy)    moderate... echo... 02/2010  . Mitral regurgitation    mild... echo.. 02/2010  . PVC (premature ventricular contraction)   . Renal calculus   . Sinus bradycardia   . Tubular adenoma of colon 08/2009    Past surgical history: Past Surgical History:  Procedure Laterality Date  . COLONOSCOPY    . KIDNEY STONE SURGERY    . POLYPECTOMY      Social History:  reports that he has never smoked. He has never used smokeless tobacco. He reports previous alcohol use. He reports that he does not use drugs.  Allergies:  No Known Allergies  Family history:  Family History  Problem Relation Age of Onset  . Hypertension Mother   . Stroke Mother 31       lived to 52  . Other Father  parkinsons ? 84  . Healthy Child   . Colon cancer Paternal Aunt   . Colon cancer Paternal Uncle      Home Meds: Prior to Admission medications   Medication Sig Start Date End Date Taking? Authorizing Provider  apixaban (ELIQUIS) 5 MG TABS tablet Take 1 tablet (5 mg total) by mouth 2 (two) times daily. 02/11/19  Yes [name], MD  atenolol (TENORMIN) 25 MG tablet TAKE 1 TABLET BY MOUTH DAILY Patient taking differently: Take 25 mg by mouth daily.  07/09/19  Yes [name], MD  carbidopa-levodopa (SINEMET CR) 50-200 MG tablet Take 1 tablet by mouth at bedtime. 10/27/19  Yes Tat, Eustace Quail, DO  carbidopa-levodopa (SINEMET IR) 25-100 MG tablet Take 1.5 tablets by  mouth 3 (three) times daily. 10/27/19  Yes Tat, Eustace Quail, DO  donepezil (ARICEPT) 10 MG tablet Take 1 tablet (10 mg total) by mouth at bedtime. 11/26/19  Yes Tat, Eustace Quail, DO  escitalopram (LEXAPRO) 5 MG tablet Take 1 tablet (5 mg total) by mouth daily. 07/23/19  Yes Tat, Eustace Quail, DO  losartan-hydrochlorothiazide (HYZAAR) 100-12.5 MG tablet Take 0.5 tablets by mouth daily. 01/30/19  Yes [name], MD  simvastatin (ZOCOR) 20 MG tablet Take 1 tablet (20 mg total) by mouth at bedtime. 09/29/19  Yes [name], MD    Physical Exam: Vitals:   12/22/19 0831 12/22/19 0900 12/22/19 1000 12/22/19 1100  BP: (!) 131/96 138/88 137/81 (!) 141/92  Pulse: 96 (!) 102 89 87  Resp: (!) 27 (!) 25 (!) 21 (!) 24  Temp: 97.6 F (36.4 C)     TempSrc: Oral     SpO2: 94% 94% 97% 100%  Weight:      Height:       Wt Readings from Last 3 Encounters:  12/22/19 80.3 kg  12/21/19 80.3 kg  11/18/19 80.3 kg   Body mass index is 24.01 kg/m.  General exam: Appears calm and comfortable.  Not in physical distress Skin: No rashes, lesions or ulcers. HEENT: Atraumatic, normocephalic, supple neck, no obvious bleeding Lungs: Clear to auscultation bilaterally CVS: Irregular rhythm, controlled rate, no murmur  GI/Abd soft, nontender, nondistended, bowel sound present CNS: Alert, awake, oriented x3 Psychiatry: Mood appropriate Extremities: No pedal edema, no calf tenderness     Consult Orders  (From admission, onward)         Start     Ordered   12/22/19 0536  Consult to hospitalist  ALL PATIENTS BEING ADMITTED/HAVING PROCEDURES NEED COVID-19 SCREENING  Once    Comments: ALL PATIENTS BEING ADMITTED/HAVING PROCEDURES NEED COVID-19 SCREENING  Provider:  (Not yet assigned)  Question Answer Comment  Place call to: [facility]   Reason for Consult Admit      12/22/19 0536          Labs on Admission:   CBC: Recent Labs  Lab 12/22/19 0319 12/22/19 0549 12/22/19 1105  WBC 8.2  --    --   HGB 16.0 14.2 13.4  HCT 49.3 43.1  --   MCV 94.4  --   --   PLT 112*  --   --     Basic Metabolic Panel: Recent Labs  Lab 12/22/19 0319  NA 140  K 3.2*  CL 103  CO2 27  GLUCOSE 103*  BUN 17  CREATININE 0.85  CALCIUM 9.3    Liver Function Tests: Recent Labs  Lab 12/22/19 0319  AST 20  ALT <5  ALKPHOS 72  BILITOT 1.7*  PROT 6.8  ALBUMIN 4.2   No results for input(s): LIPASE, AMYLASE in the last 168 hours. No results for input(s): AMMONIA in the last 168 hours.  Cardiac Enzymes: No results for input(s): CKTOTAL, CKMB, CKMBINDEX, TROPONINI in the last 168 hours.  BNP (last 3 results) No results for input(s): BNP in the last 8760 hours.  ProBNP (last 3 results) No results for input(s): PROBNP in the last 8760 hours.  CBG: No results for input(s): GLUCAP in the last 168 hours.  Lipase  No results found for: LIPASE   Urinalysis    Component Value Date/Time   COLORURINE YELLOW 03/31/2013 0523   APPEARANCEUR TURBID (A) 03/31/2013 0523   LABSPEC 1.017 03/31/2013 0523   PHURINE 7.5 03/31/2013 0523   GLUCOSEU NEGATIVE 03/31/2013 0523   GLUCOSEU NEGATIVE 10/20/2008 0000   HGBUR MODERATE (A) 03/31/2013 0523   BILIRUBINUR NEGATIVE 03/31/2013 0523   KETONESUR NEGATIVE 03/31/2013 0523   PROTEINUR NEGATIVE 03/31/2013 0523   UROBILINOGEN 0.2 03/31/2013 0523   NITRITE NEGATIVE 03/31/2013 0523   LEUKOCYTESUR NEGATIVE 03/31/2013 0523     Drugs of Abuse  No results found for: LABOPIA, COCAINSCRNUR, LABBENZ, AMPHETMU, THCU, LABBARB    Radiological Exams on Admission: DG Chest Portable 1 View  Result Date: 12/22/2019 CLINICAL DATA:  Rectal bleeding following colonoscopy. EXAM: PORTABLE CHEST 1 VIEW COMPARISON:  One-view chest x-ray 01/17/2015 FINDINGS: Heart is enlarged. Atherosclerotic changes are noted at the arch. No edema or effusion is present. No focal airspace disease is present. Axial skeleton is within limits. IMPRESSION: 1. Cardiomegaly without  failure. 2. Atherosclerosis. Electronically Signed   By: San Morelle M.D.   On: 12/22/2019 04:12   DG Abd Portable 2 Views  Result Date: 12/22/2019 CLINICAL DATA:  Rectal bleeding following colonoscopy. EXAM: PORTABLE ABDOMEN - 2 VIEW COMPARISON:  CT of the abdomen and pelvis 03/22/2013. FINDINGS: Gas is noted throughout the colon. No free air is present. No obstruction is present. Fluid layers within the colon. Degenerative changes are present in the lower lumbar spine with rightward curvature. IMPRESSION: Normal bowel gas pattern following colonoscopy without evidence for obstruction or free air. Electronically Signed   By: San Morelle M.D.   On: 12/22/2019 04:14     ------------------------------------------------------------------------------------------------------ Assessment/Plan: Active Problems:   Acute GI bleeding   GI bleeding  Acute lower GI bleeding  post colonoscopy bleeding -Presented with lower GI bleeding within 24 hours of colonoscopy. -3 polyps removed during colonoscopy yesterday. -Suspect bleeding from the polyp removal site. -Colonoscopy planned today by GI. -Overnight observation in stepdown unit.  Acute blood loss anemia -Patient's hemoglobin at baseline seems to be more than 16 in the past several readings. -Hemoglobin was 16 at presentation earlier this morning, dropped to 14.2 within 3 hours due to persistent bleeding -1 unit of PRBC was transfused in the ED.  Continue to monitor.  Hypokalemia -Potassium low at 3.2. -Received 2 doses of 10 mEq potassium replacement in the ED.  Cardiovascular issues: Atrial flutter/ HTN/ diastolic CHF/ mild aortic insufficiency/ HLD -Home meds include atenolol, Eliquis, losartan/HCTZ, simvastatin. -Eliquis on hold for last 3 days prior to colonoscopy. -Give atenolol this morning to avoid RVR due to withdrawal -Keep losartan/HCTZ on hold. -Continue simvastatin.  Parkinson's disease -Resume Sinemet post  procedure  Mobility: Encourage ambulation Code Status:  DNR/DNI.  Confirmed with patient and wife at bedside  DVT prophylaxis:  Avoid heparin/Lovenox for now because of active bleeding status.  Encourage ambulation Antimicrobials:  None Fluid: Normal sinus  75/h Diet: N.p.o. for now.  Consultants: GI Family Communication:  Spoke with wife at bedside  Status is: Inpatient  Remains inpatient appropriate because:Hemodynamically unstable   Dispo: The patient is from: Home              Anticipated d/c is to: Home              Anticipated d/c date is: 2 days              Patient currently is not medically stable to d/c. -------------------------------------------------------------------------------------------------------------------------------- Severity of Illness: The appropriate patient status for this patient is INPATIENT. Inpatient status is judged to be reasonable and necessary in order to provide the required intensity of service to ensure the patient's safety. The patient's presenting symptoms, physical exam findings, and initial radiographic and laboratory data in the context of their chronic comorbidities is felt to place them at high risk for further clinical deterioration. Furthermore, it is not anticipated that the patient will be medically stable for discharge from the hospital within 2 midnights of admission. The following factors support the patient status of inpatient.   " The patient's presenting symptoms include rectal bleeding " The worrisome physical exam findings include rectal bleeding. " The initial radiographic and laboratory data are worrisome because of drop in hemoglobin. " The chronic co-morbidities include long-term anticoagulation.   * I certify that at the point of admission it is my clinical judgment that the patient will require inpatient hospital care spanning beyond 2 midnights from the point of admission due to high intensity of service, high risk for  further deterioration and high frequency of surveillance required.*   -----------------------------------------------------------------------------------------------------  Cline Cools, MD [facility] Pager: [phone] (Secure Chat preferred). 12/22/2019

## 2019-12-22 NOTE — ED Notes (Signed)
Pt ambulated to bedside commode.

## 2019-12-22 NOTE — Transfer of Care (Signed)
Immediate Anesthesia Transfer of Care Note  Patient: Carlos [name]  Procedure(s) Performed: COLONOSCOPY WITH PROPOFOL (N/A ) HEMOSTASIS CLIP PLACEMENT  Patient Location: PACU  Anesthesia Type:MAC  Level of Consciousness: awake, alert  and patient cooperative  Airway & Oxygen Therapy: Patient Spontanous Breathing and Patient connected to face mask oxygen  Post-op Assessment: Report given to RN and Post -op Vital signs reviewed and stable  Post vital signs: Reviewed and stable  Last Vitals:  Vitals Value Taken Time  BP 107/60 12/22/19 1520  Temp    Pulse 71 12/22/19 1520  Resp 23 12/22/19 1520  SpO2 96 % 12/22/19 1520  Vitals shown include unvalidated device data.  Last Pain:  Vitals:   12/22/19 1220  TempSrc: Axillary  PainSc: 0-No pain         Complications: No apparent anesthesia complications

## 2019-12-22 NOTE — Anesthesia Preprocedure Evaluation (Addendum)
Anesthesia Evaluation  Patient identified by MRN, date of birth, ID band Patient awake    Reviewed: Allergy & Precautions, NPO status , Patient's Chart, lab work & pertinent test results  Airway Mallampati: II  TM Distance: >3 FB Neck ROM: Full    Dental  (+) Teeth Intact, Caps   Pulmonary neg pulmonary ROS,    Pulmonary exam normal breath sounds clear to auscultation       Cardiovascular hypertension, Pt. on medications and Pt. on home beta blockers Normal cardiovascular exam+ dysrhythmias Atrial Fibrillation  Rhythm:Regular Rate:Normal  EKG- NSR, inc RBB pattern   Neuro/Psych Anxiety Parkinson's disease    GI/Hepatic Acute GI Bleeding post polypectomy x 3 on 12/21/19 OP Hx/o colon polyps   Endo/Other  Hyperlipidemia  Renal/GU Renal diseaseHx/o renal calculi  negative genitourinary   Musculoskeletal  (+) Arthritis , Osteoarthritis,    Abdominal   Peds  Hematology  (+) anemia , Eliquis therapy- last dose 12/18/19    Anesthesia Other Findings   Reproductive/Obstetrics                             Anesthesia Physical Anesthesia Plan  ASA: III  Anesthesia Plan: MAC   Post-op Pain Management:    Induction: Intravenous  PONV Risk Score and Plan: 1 and Ondansetron, Propofol infusion and Treatment may vary due to age or medical condition  Airway Management Planned: Natural Airway, Simple Face Mask and Nasal Cannula  Additional Equipment:   Intra-op Plan:   Post-operative Plan:   Informed Consent: I have reviewed the patients History and Physical, chart, labs and discussed the procedure including the risks, benefits and alternatives for the proposed anesthesia with the patient or authorized representative who has indicated his/her understanding and acceptance.     Dental advisory given  Plan Discussed with: Anesthesiologist, CRNA and Surgeon  Anesthesia Plan Comments:          Anesthesia Quick Evaluation

## 2019-12-22 NOTE — ED Notes (Signed)
Per Rancour, MD, do not complete orthostatic vital signs and keep pt in bed at this time.

## 2019-12-22 NOTE — Progress Notes (Signed)
Patient stood up from chair and had some pink tinged drainage from rectum, no bright red blood.  Patient at that time clammy, vomited several times in trash can.  Assisted back to bed, Zofran given.  VSS.  Patient felt much better after getting cleaned up and receiving Zofran.  Will continue to monitor.

## 2019-12-22 NOTE — ED Notes (Signed)
This Probation officer gave report to [employer].

## 2019-12-22 NOTE — ED Notes (Signed)
Patient has lost over 2L of blood through bowel movements. This Probation officer has paged admitting hospitalist.

## 2019-12-22 NOTE — Op Note (Signed)
[facility] Patient Name: Carlos [name] Procedure Date: 12/22/2019 MRN: [id number] Attending MD: Jackquline Denmark , MD Date of Birth: 1941/04/02 CSN: [id number] Age: 79 Admit Type: Emergency Department Procedure:                Colonoscopy Indications:              Post polypectomy bleed Providers:                Jackquline Denmark, MD, Benetta Spar RN, RN, Laverda Sorenson, Technician, [facility], CRNA Referring MD:              Medicines:                Monitored Anesthesia Care Complications:            No immediate complications. Estimated Blood Loss:     Estimated blood loss: none. Procedure:                Pre-Anesthesia Assessment:                           - Prior to the procedure, a History and Physical                            was performed, and patient medications and                            allergies were reviewed. The patient's tolerance of                            previous anesthesia was also reviewed. The risks                            and benefits of the procedure and the sedation                            options and risks were discussed with the patient.                            All questions were answered, and informed consent                            was obtained. Prior Anticoagulants: The patient has                            taken Eliquis (apixaban), last dose was 4 days                            prior to procedure. ASA Grade Assessment: III - A                            patient with severe systemic disease. After  reviewing the risks and benefits, the patient was                            deemed in satisfactory condition to undergo the                            procedure.                           After obtaining informed consent, the colonoscope                            was passed under direct vision. Throughout the                            procedure, the patient's blood  pressure, pulse, and                            oxygen saturations were monitored continuously. The                            [id number]) Olympus colonoscope was                            introduced through the anus and advanced to the the                            cecum, identified by appendiceal orifice and                            ileocecal valve. The colonoscopy was performed                            without difficulty. The patient tolerated the                            procedure well. The quality of the bowel                            preparation was fair (due to presence of blood and                            clots throughout the colon). The ileocecal valve                            was photographed. Scope In: 2:26:01 PM Scope Out: 3:04:33 PM Scope Withdrawal Time: 0 hours 32 minutes 11 seconds  Total Procedure Duration: 0 hours 38 minutes 32 seconds  Findings:      Difficult exam due to significant blood and clots throughout the colon.       Took a long time to wash and aspirate.      Stigmata of recent bleeding was seen in the proximal ascending colon       (visible vessel but no active bleeding), secondary to previous  polypectomy procedure. To stop active bleeding, two hemostatic clips       were successfully placed (MR conditional). There was no bleeding at the       end of the procedure.      Polypectomy site in the transverse colon was not visualized. It had       completely healed.      Polypectomy site in the descending colon did reveal a flat brown spot       and a superficial ulcer 6 to 8 mm. A single hemostatic clip was placed       across. There was no bleeding at the end of the procedure.      Few small diverticula in the sigmoid colon. Impression:               -Ascending colon post polypectomy bleed s/p                            endoscopic clipping.                           -Descending colon polypectomy site was also clipped.                            -No active bleeding towards the end of the                            procedure. Moderate Sedation:      Not Applicable - Patient had care per Anesthesia. Recommendation:           - Trend CBC. Transfuse if needed.                           - Clear liquid diet.                           - Continue present medications.                           - Hold Eliquis for 72 hours.                           - The findings and recommendations were discussed                            with the patient's wife.                           - Return patient to hospital ward for ongoing care.                           - If no further bleeding and hemoglobin is stable,                            can discharge home in a.m.                           - In an unlikely event, if there is further  bleeding, would consider mesenteric                            angio/embolization. Procedure Code(s):        --- Professional ---                           [phone], Colonoscopy, flexible; with control of                            bleeding, any method Diagnosis Code(s):        --- Professional ---                           K91.840, Postprocedural hemorrhage of a digestive                            system organ or structure following a digestive                            system procedure                           K92.1, Melena (includes Hematochezia) CPT copyright 2019 American Medical Association. All rights reserved. The codes documented in this report are preliminary and upon coder review may  be revised to meet current compliance requirements. Jackquline Denmark, MD 12/22/2019 3:23:04 PM This report has been signed electronically. Number of Addenda: 0

## 2019-12-22 NOTE — ED Triage Notes (Signed)
Pt arriving with rectal bleeding. Pt had colonoscopy yesterday and had 3 polyps removed. Pt has been passing small to moderate amounts of blood since having procedure.

## 2019-12-22 NOTE — Consult Note (Addendum)
Consultation  Referring Provider: Dr. Hal Hope     Primary Care Physician:  [name], MD Primary Gastroenterologist: Dr. Fuller Plan       Reason for Consultation: Post polypectomy bleed              HPI:   Carlos [name] is a 79 y.o. male with a past medical history as listed below including aortic insufficiency and atrial flutter, who presented to the ED this morning with a complaint of rectal bleeding after a colonoscopy.    Today, the patient is found laying in bed with his wife by his bedside he does provide much of his history, they explain that he had his colonoscopy at 56 AM yesterday, he had 3 polyps removed and when he went home he had some "stringy red stools" around 4 PM, they called our office and were told that if it worsened he needed to go to the ER.  He then passed another stool with a large amount of maroon/bloody bowel movement around midnight, his wife tells me she tried to call the on-call physician but nobody ever answers so she brought him to the ER.  Since then he has had multiple episodes of large-volume blood mixed with red and maroon blood.  She tells me that it has been quite a lot.  Since finishing the bowel prep at around 745 this morning he has had bloody stool about every 30 minutes.  Describes that he is normally on Eliquis but is not taking any for the past 3 days and has not restarted it since his colonoscopy.  His wife is very concerned in regards to the amount of blood that he is passing.    Denies fever, chills, dizziness or syncope.  ED course:  Dr. Havery Moros recommended a half dose of movi prep in preparation for a colonoscopy today, patient has received 1 unit PRBCs;  Hemoglobin from 16-->14--> recheck in process  Recent GI history: 12/21/2019 colonoscopy with Dr. Fuller Plan: Colonoscopy showed: One 15 mm polyp in the ascending colon, removed with a hot snare. Resected and retrieved. - Two 6 to 9 mm polyps in the descending colon and in the  transverse colon, removed with a cold snare. Resected and retrieved. - Mild diverticulosis in the left colon. - Internal hemorrhoids. - The examination was otherwise normal on direct and retroflexion views.  Past Medical History:  Diagnosis Date  . Allergic rhinitis   . Allergy   . Anxiety   . Aortic insufficiency    mild... echo... 02/2010  . Atrial flutter ([place]) 02/03/2010   Consult by Dr. Lovena Le... June, 2011.... plan rate control...  Coumadin not needed... ablation if rate not controlled well  . Basal cell carcinoma   . Chest pain    Nuclear, March, 2010, no ischemia  . Degenerative joint disease   . Diastolic dysfunction    EF 50-55%... echo.. 02/2010 (55-60% echo 2009)  . Dyslipidemia   . Ejection fraction    EF 50-55%, echo, June, 2011  . Hemorrhoids   . HEMORRHOIDS 10/21/2007   Qualifier: History of  By: [name], [name]   . HTN (hypertension)   . Incomplete RBBB    IRBBB; sinus bradycardia w/ PVCs  . LVH (left ventricular hypertrophy)    moderate... echo... 02/2010  . Mitral regurgitation    mild... echo.. 02/2010  . PVC (premature ventricular contraction)   . Renal calculus   . Sinus bradycardia   . Tubular adenoma of colon 08/2009  Past Surgical History:  Procedure Laterality Date  . COLONOSCOPY    . KIDNEY STONE SURGERY    . POLYPECTOMY      Family History  Problem Relation Age of Onset  . Hypertension Mother   . Stroke Mother 108       lived to 106  . Other Father        parkinsons ? 84  . Healthy Child   . Colon cancer Paternal Aunt   . Colon cancer Paternal Uncle     Social History   Tobacco Use  . Smoking status: Never Smoker  . Smokeless tobacco: Never Used  Substance Use Topics  . Alcohol use: Not Currently    Alcohol/week: 0.0 standard drinks    Comment: etoh; glass of wine one time a week   . Drug use: No    Prior to Admission medications   Medication Sig Start Date End Date Taking? Authorizing Provider  apixaban (ELIQUIS) 5 MG  TABS tablet Take 1 tablet (5 mg total) by mouth 2 (two) times daily. 02/11/19  Yes [name], MD  atenolol (TENORMIN) 25 MG tablet TAKE 1 TABLET BY MOUTH DAILY Patient taking differently: Take 25 mg by mouth daily.  07/09/19  Yes [name], MD  carbidopa-levodopa (SINEMET CR) 50-200 MG tablet Take 1 tablet by mouth at bedtime. 10/27/19  Yes Tat, Eustace Quail, DO  carbidopa-levodopa (SINEMET IR) 25-100 MG tablet Take 1.5 tablets by mouth 3 (three) times daily. 10/27/19  Yes Tat, Eustace Quail, DO  donepezil (ARICEPT) 10 MG tablet Take 1 tablet (10 mg total) by mouth at bedtime. 11/26/19  Yes Tat, Eustace Quail, DO  escitalopram (LEXAPRO) 5 MG tablet Take 1 tablet (5 mg total) by mouth daily. 07/23/19  Yes Tat, Eustace Quail, DO  losartan-hydrochlorothiazide (HYZAAR) 100-12.5 MG tablet Take 0.5 tablets by mouth daily. 01/30/19  Yes [name], MD  simvastatin (ZOCOR) 20 MG tablet Take 1 tablet (20 mg total) by mouth at bedtime. 09/29/19  Yes [name], MD    Current Facility-Administered Medications  Medication Dose Route Frequency Provider Last Rate Last Admin  . peg 3350 powder (MOVIPREP) kit 100 g  0.5 kit Oral Once Ezequiel Essex, MD       Current Outpatient Medications  Medication Sig Dispense Refill  . apixaban (ELIQUIS) 5 MG TABS tablet Take 1 tablet (5 mg total) by mouth 2 (two) times daily. 180 tablet 3  . atenolol (TENORMIN) 25 MG tablet TAKE 1 TABLET BY MOUTH DAILY (Patient taking differently: Take 25 mg by mouth daily. ) 90 tablet 3  . carbidopa-levodopa (SINEMET CR) 50-200 MG tablet Take 1 tablet by mouth at bedtime. 90 tablet 1  . carbidopa-levodopa (SINEMET IR) 25-100 MG tablet Take 1.5 tablets by mouth 3 (three) times daily. 405 tablet 1  . donepezil (ARICEPT) 10 MG tablet Take 1 tablet (10 mg total) by mouth at bedtime. 90 tablet 3  . escitalopram (LEXAPRO) 5 MG tablet Take 1 tablet (5 mg total) by mouth daily. 90 tablet 3  . losartan-hydrochlorothiazide (HYZAAR)  100-12.5 MG tablet Take 0.5 tablets by mouth daily. 46 tablet 3  . simvastatin (ZOCOR) 20 MG tablet Take 1 tablet (20 mg total) by mouth at bedtime. 90 tablet 3    Allergies as of 12/22/2019  . (No Known Allergies)     Review of Systems:    Constitutional: No weight loss, fever or chills Skin: No rash  Cardiovascular: No chest pain Respiratory: No SOB  Gastrointestinal:  See HPI and otherwise negative Genitourinary: No dysuria Neurological: No headache, dizziness or syncope Musculoskeletal: No new muscle or joint pain Hematologic: No bruising Psychiatric: No history of depression or anxiety    Physical Exam:  Vital signs in last 24 hours: Temp:  [96.6 F (35.9 C)-98.3 F (36.8 C)] 97.6 F (36.4 C) (04/20 0831) Pulse Rate:  [53-103] 96 (04/20 0831) Resp:  [14-27] 27 (04/20 0831) BP: (89-154)/(49-99) 131/96 (04/20 0831) SpO2:  [93 %-100 %] 94 % (04/20 0831) Weight:  [80.3 kg] 80.3 kg (04/20 0250)   General:   Pleasant Caucasian male appears to be in NAD, Well developed, Well nourished, alert and cooperative Head:  Normocephalic and atraumatic. Eyes:   PEERL, EOMI. No icterus. Conjunctiva pink. Ears:  Normal auditory acuity. Neck:  Supple Throat: Oral cavity and pharynx without inflammation, swelling or lesion.  Lungs: Respirations even and unlabored. Lungs clear to auscultation bilaterally.   No wheezes, crackles, or rhonchi.  Heart: Normal S1, S2. No MRG. Regular rate and rhythm. No peripheral edema, cyanosis or pallor.  Abdomen:  Soft, nondistended, nontender. No rebound or guarding. Normal bowel sounds. No appreciable masses or hepatomegaly. Rectal:  Gross blood per ED  Msk:  Symmetrical without gross deformities. Peripheral pulses intact.  Extremities:  Without edema, no deformity or joint abnormality.  Neurologic:  Alert and  oriented x4;  grossly normal neurologically.  Skin:   Dry and intact without significant lesions or rashes. Psychiatric: Demonstrates good  judgement and reason without abnormal affect or behaviors.   LAB RESULTS: Recent Labs    12/22/19 0319 12/22/19 0549  WBC 8.2  --   HGB 16.0 14.2  HCT 49.3 43.1  PLT 112*  --    BMET Recent Labs    12/22/19 0319  NA 140  K 3.2*  CL 103  CO2 27  GLUCOSE 103*  BUN 17  CREATININE 0.85  CALCIUM 9.3   LFT Recent Labs    12/22/19 0319  PROT 6.8  ALBUMIN 4.2  AST 20  ALT <5  ALKPHOS 72  BILITOT 1.7*   PT/INR Recent Labs    12/22/19 0319  LABPROT 14.1  INR 1.1    STUDIES: DG Chest Portable 1 View  Result Date: 12/22/2019 CLINICAL DATA:  Rectal bleeding following colonoscopy. EXAM: PORTABLE CHEST 1 VIEW COMPARISON:  One-view chest x-ray 01/17/2015 FINDINGS: Heart is enlarged. Atherosclerotic changes are noted at the arch. No edema or effusion is present. No focal airspace disease is present. Axial skeleton is within limits. IMPRESSION: 1. Cardiomegaly without failure. 2. Atherosclerosis. Electronically Signed   By: San Morelle M.D.   On: 12/22/2019 04:12   DG Abd Portable 2 Views  Result Date: 12/22/2019 CLINICAL DATA:  Rectal bleeding following colonoscopy. EXAM: PORTABLE ABDOMEN - 2 VIEW COMPARISON:  CT of the abdomen and pelvis 03/22/2013. FINDINGS: Gas is noted throughout the colon. No free air is present. No obstruction is present. Fluid layers within the colon. Degenerative changes are present in the lower lumbar spine with rightward curvature. IMPRESSION: Normal bowel gas pattern following colonoscopy without evidence for obstruction or free air. Electronically Signed   By: San Morelle M.D.   On: 12/22/2019 04:14    Impression / Plan:   Impression: 1.  Post polypectomy bleed: Colonoscopy 12/21/2019 with removal of 3 polyps, bleeding starting yesterday evening 2.  Chronic anticoagulation for A. fib: Typically on Eliquis but has not taken this for the past 3 days  Plan: 1.  Patient has already drank half of  the movi prep, finished at 7:45  am. 2.  Will order Vit K x1 3.  Patient is scheduled for his colonoscopy in around 1230/1:00 this afternoon.  This will be with Dr. Lyndel Safe.  Did discuss risks, benefits, limitations and alternatives and the patient agrees to proceed. 4.  Tried to reassure the wife that we are watching her husband very well including vitals etc.  If he becomes unstable then we will change our plan. 5.  Please await final recommendations from Dr. Lyndel Safe later today.  Thank you for your kind consultation, we will continue to follow.  Carlos [name]  12/22/2019, 8:33 AM   Attending physician's note   I have taken an interval history, reviewed the chart and examined the patient. I agree with the Advanced Practitioner's note, impression and recommendations.   Post polypectomy bleed (had colon done 12/21/2019). HD stable A. fib on Eliquis (has not taken for past 3 days)  Plan -Trend hemoglobin. -Semiemergent colonoscopy.  Explained risks and benefits.   Patient not pleased at "delay".  I apologized as we had several procedures in [name] and this is earliest we could get to him.    Carmell Austria, MD Velora Heckler Fabienne Bruns [phone].

## 2019-12-22 NOTE — ED Notes (Signed)
This Engineer, maintenance (IT) consulted with doctor and pharmacy on [employer]. They advised to proceed with PREP initiation.

## 2019-12-22 NOTE — Anesthesia Postprocedure Evaluation (Signed)
Anesthesia Post Note  Patient: Carlos [name]  Procedure(s) Performed: COLONOSCOPY WITH PROPOFOL (N/A ) HEMOSTASIS CLIP PLACEMENT     Patient location during evaluation: PACU Anesthesia Type: MAC Level of consciousness: awake and alert and oriented Pain management: pain level controlled Vital Signs Assessment: post-procedure vital signs reviewed and stable Respiratory status: spontaneous breathing, nonlabored ventilation and respiratory function stable Cardiovascular status: stable and blood pressure returned to baseline Postop Assessment: no apparent nausea or vomiting Anesthetic complications: no    Last Vitals:  Vitals:   12/22/19 1530 12/22/19 1540  BP: 108/63 111/67  Pulse: 73 72  Resp: (!) 24 17  Temp:    SpO2: 93% 90%    Last Pain:  Vitals:   12/22/19 1540  TempSrc:   PainSc: 0-No pain                 Raelea Gosse A.

## 2019-12-23 ENCOUNTER — Encounter: Payer: Self-pay | Admitting: *Deleted

## 2019-12-23 ENCOUNTER — Telehealth: Payer: Self-pay

## 2019-12-23 LAB — BASIC METABOLIC PANEL
Anion gap: 8 (ref 5–15)
BUN: 12 mg/dL (ref 8–23)
CO2: 26 mmol/L (ref 22–32)
Calcium: 8.3 mg/dL — ABNORMAL LOW (ref 8.9–10.3)
Chloride: 106 mmol/L (ref 98–111)
Creatinine, Ser: 0.9 mg/dL (ref 0.61–1.24)
GFR calc Af Amer: >60 mL/min (ref >60)
GFR calc non Af Amer: >60 mL/min (ref >60)
Glucose, Bld: 119 mg/dL — ABNORMAL HIGH (ref 70–99)
Potassium: 3.6 mmol/L (ref 3.5–5.1)
Sodium: 140 mmol/L (ref 135–145)

## 2019-12-23 LAB — CBC WITH DIFFERENTIAL/PLATELET
Abs Immature Granulocytes: 0.06 10*3/uL (ref 0.00–0.07)
Basophils Absolute: 0 10*3/uL (ref 0.0–0.1)
Basophils Relative: 0 %
Eosinophils Absolute: 0 10*3/uL (ref 0.0–0.5)
Eosinophils Relative: 0 %
HCT: 32.6 % — ABNORMAL LOW (ref 39.0–52.0)
Hemoglobin: 10.4 g/dL — ABNORMAL LOW (ref 13.0–17.0)
Immature Granulocytes: 1 %
Lymphocytes Relative: 14 %
Lymphs Abs: 1.6 10*3/uL (ref 0.7–4.0)
MCH: 31.1 pg (ref 26.0–34.0)
MCHC: 31.9 g/dL (ref 30.0–36.0)
MCV: 97.6 fL (ref 80.0–100.0)
Monocytes Absolute: 0.7 10*3/uL (ref 0.1–1.0)
Monocytes Relative: 6 %
Neutro Abs: 8.9 10*3/uL — ABNORMAL HIGH (ref 1.7–7.7)
Neutrophils Relative %: 79 %
Platelets: 94 10*3/uL — ABNORMAL LOW (ref 150–400)
RBC: 3.34 MIL/uL — ABNORMAL LOW (ref 4.22–5.81)
RDW: 13.9 % (ref 11.5–15.5)
WBC: 11.3 10*3/uL — ABNORMAL HIGH (ref 4.0–10.5)
nRBC: 0 % (ref 0.0–0.2)

## 2019-12-23 LAB — TYPE AND SCREEN
ABO/RH(D): AB POS
Antibody Screen: NEGATIVE
Unit division: 0

## 2019-12-23 LAB — CBC
HCT: 31.5 % — ABNORMAL LOW (ref 39.0–52.0)
Hemoglobin: 10.4 g/dL — ABNORMAL LOW (ref 13.0–17.0)
MCH: 31.5 pg (ref 26.0–34.0)
MCHC: 33 g/dL (ref 30.0–36.0)
MCV: 95.5 fL (ref 80.0–100.0)
Platelets: 88 10*3/uL — ABNORMAL LOW (ref 150–400)
RBC: 3.3 MIL/uL — ABNORMAL LOW (ref 4.22–5.81)
RDW: 13.9 % (ref 11.5–15.5)
WBC: 14.4 10*3/uL — ABNORMAL HIGH (ref 4.0–10.5)
nRBC: 0 % (ref 0.0–0.2)

## 2019-12-23 LAB — BPAM RBC
Blood Product Expiration Date: 202104262359
ISSUE DATE / TIME: 202104200809
Unit Type and Rh: 600

## 2019-12-23 MED ORDER — POLYSACCHARIDE IRON COMPLEX 150 MG PO CAPS: 150.0000 mg | ORAL_CAPSULE | Freq: Every day | ORAL | 0 refills | Status: DC

## 2019-12-23 MED ORDER — SODIUM CHLORIDE 0.9 % IV BOLUS
500.0000 mL | Freq: Once | INTRAVENOUS | Status: AC
  Administered 2019-12-23: 500 mL via INTRAVENOUS

## 2019-12-23 MED ORDER — LOSARTAN POTASSIUM-HCTZ 100-12.5 MG PO TABS: 0.5000 | ORAL_TABLET | Freq: Every day | ORAL | 3 refills | Status: DC

## 2019-12-23 MED ORDER — APIXABAN 5 MG PO TABS: 5.0000 mg | ORAL_TABLET | Freq: Two times a day (BID) | ORAL | 3 refills | Status: DC

## 2019-12-23 MED ORDER — POLYSACCHARIDE IRON COMPLEX 150 MG PO CAPS: 150.0000 mg | ORAL_CAPSULE | Freq: Every day | ORAL | Status: DC

## 2019-12-23 MED ORDER — SENNA 8.6 MG PO TABS: 1.0000 | ORAL_TABLET | Freq: Two times a day (BID) | ORAL | 0 refills | Status: DC

## 2019-12-23 NOTE — Progress Notes (Signed)
Progress Note    ASSESSMENT AND PLAN:   Post polypectomy bleed s/p colon with clipping 4/20.  Hb 14 to 10 despite 1U PRBC. Appears to have stopped.  Plan: -Recheck CBC 4 PM today. If Hb OK, and there is no rebleeding, can D/C home with GI FU in 2-3 weeks with Dr Fuller Plan. -Restart Eliquis 4/26 -Can start iron supplements at D/C. -Advance diet -D/w patient and patient's wife in detail.      SUBJECTIVE  Feels much better No further bleeding No nausea, vomiting, abdominal pain    OBJECTIVE:     Vital signs in last 24 hours: Temp:  [97.5 F (36.4 C)-98.8 F (37.1 C)] 98.7 F (37.1 C) (04/21 0653) Pulse Rate:  [65-90] 69 (04/21 0653) Resp:  [15-24] 20 (04/21 0653) BP: (81-141)/(54-92) 101/59 (04/21 0653) SpO2:  [90 %-100 %] 95 % (04/21 0653) Weight:  [78 kg] 78 kg (04/20 1220) Last BM Date: 12/22/19 General:   Alert, well-developed male in NAD EENT:  Normal hearing, non icteric sclera, conjunctive pink.  Heart:  Regular rate and rhythm; no murmur.  No lower extremity edema   Pulm: Normal respiratory effort, lungs CTA bilaterally without wheezes or crackles. Abdomen:  Soft, nondistended, nontender.  Normal bowel sounds,.       Neurologic:  Alert and  oriented x4;  grossly normal neurologically. Psych:  Pleasant, cooperative.  Normal mood and affect.   Intake/Output from previous day: 04/20 0701 - 04/21 0700 In: 1300 [I.V.:1300] Out: 1550 [Urine:350; Stool:1200] Intake/Output this shift: No intake/output data recorded.  Lab Results: Recent Labs    12/22/19 0319 12/22/19 0319 12/22/19 0549 12/22/19 1105 12/23/19 0550  WBC 8.2  --   --   --  14.4*  HGB 16.0   < > 14.2 13.4 10.4*  HCT 49.3  --  43.1  --  31.5*  PLT 112*  --   --   --  88*   < > = values in this interval not displayed.   BMET Recent Labs    12/22/19 0319 12/23/19 0550  NA 140 140  K 3.2* 3.6  CL 103 106  CO2 27 26  GLUCOSE 103* 119*  BUN 17 12  CREATININE 0.85 0.90    CALCIUM 9.3 8.3*   LFT Recent Labs    12/22/19 0319  PROT 6.8  ALBUMIN 4.2  AST 20  ALT <5  ALKPHOS 72  BILITOT 1.7*   PT/INR Recent Labs    12/22/19 0319  LABPROT 14.1  INR 1.1   Hepatitis Panel No results for input(s): HEPBSAG, HCVAB, HEPAIGM, HEPBIGM in the last 72 hours.  DG Chest Portable 1 View  Result Date: 12/22/2019 CLINICAL DATA:  Rectal bleeding following colonoscopy. EXAM: PORTABLE CHEST 1 VIEW COMPARISON:  One-view chest x-ray 01/17/2015 FINDINGS: Heart is enlarged. Atherosclerotic changes are noted at the arch. No edema or effusion is present. No focal airspace disease is present. Axial skeleton is within limits. IMPRESSION: 1. Cardiomegaly without failure. 2. Atherosclerosis. Electronically Signed   By: San Morelle M.D.   On: 12/22/2019 04:12   DG Abd Portable 2 Views  Result Date: 12/22/2019 CLINICAL DATA:  Rectal bleeding following colonoscopy. EXAM: PORTABLE ABDOMEN - 2 VIEW COMPARISON:  CT of the abdomen and pelvis 03/22/2013. FINDINGS: Gas is noted throughout the colon. No free air is present. No obstruction is present. Fluid layers within the colon. Degenerative changes are present in the lower lumbar spine with rightward curvature. IMPRESSION: Normal bowel gas pattern following  colonoscopy without evidence for obstruction or free air. Electronically Signed   By: San Morelle M.D.   On: 12/22/2019 04:14     Active Problems:   Acute GI bleeding   GI bleeding   Rectal bleeding     LOS: 1 day     Carmell Austria, MD 12/23/2019, 9:08 AM [facility] [phone]

## 2019-12-23 NOTE — Telephone Encounter (Signed)
  Follow up Call-  Call back number 12/21/2019  Post procedure Call Back phone  # [phone]- wife's cell  Permission to leave phone message Yes  Some recent data might be hidden     Patient questions:  Do you have a fever, pain , or abdominal swelling? No. Pain Score  0 *  Have you tolerated food without any problems? No.  Have you been able to return to your normal activities? No.  Do you have any questions about your discharge instructions: Diet   No. Medications  No. Follow up visit  No.  Do you have questions or concerns about your Care? Yes.    Actions: * If pain score is 4 or above: Physician/ provider Notified : Lucio Edward, MD    Pt had "stringy blood" coming from his rectum.  Pt's wife called [facility] around 16:30 and said she was told to continue to monoitor and to call emergency number if worsens.  Pt's wife said bleeding worsened and she tried to call the emergency number on discharge instructions.  She tried 3-4 times and no one answered around midnight..  She took her husband to the ER at [facility].  Pt continued to bleed and did not get scoped until until 14:30 - 15:00 pm the next by Dr. Lyndel Safe.  Pt wife is very concerned that it took so very long to have her husband scoped to find the cause and stop the bleeding.  Pt had to recieve blood per his wife.  I repeatedly told her how very sorry we were.  That I would pass this information to our director and Dr. Fuller Plan.  She said the only reason she would want to discuss this with our director or Dr. Fuller Plan is to find out what went wrong with the emergency number and to make sure this does not happened to other patients.  maw

## 2019-12-23 NOTE — Progress Notes (Signed)
Pt SBP noted to be in the 80's. Pt is alseep and remained asymptomatic. [facility] paged about such. New orders placed and carried out. Wife at bedside and updated with plan. Agrees to such.   500 mL bolus infusing and will recheck BP following completion.   Pt in NAD, asleep in bed. Will continues to assess and monitor pt.

## 2019-12-23 NOTE — Progress Notes (Signed)
Pt SP still noted to be in the 80's. [facility] on call paged again at this time. New orders placed and carried out.  548mL bolus infusing. Pt tolerating well. Remains asymptomatic, still asleep, will arouse and answer questions appropriately.   [facility] agreeable with maintaining MAP greater than 60 and await AM labs. Wife updated with plan. All needs and concerns addressed at this time, denies any further.  Will continue to assess and monitor pt.

## 2019-12-23 NOTE — Discharge Summary (Signed)
Physician Discharge Summary  Carlos [name] [id number] DOB: 1941/04/14 DOA: 12/22/2019  PCP: [name], MD  Admit date: 12/22/2019 Discharge date: 12/23/2019  Admitted From: Home Disposition:  Home  Recommendations for Outpatient Follow-up:  1. Follow up with PCP in 1-2 weeks 2. Follow up with Gastroenterology within 1-2 weeks  3. Please obtain CMP/CBC, Mag, Phos in one week 4. Please follow up on the following pending results:  Home Health: No  Equipment/Devices: None   Discharge Condition: Stable  CODE STATUS: DO NOT RESUSCITATE Diet recommendation: Soft Heart Healthy Diet  Brief/Interim Summary: HPI per Dr. Terrilee Croak on 12/22/19 Carlos [name] is a 79 y.o. male with PMH significant for HTN, HLD, A-flutter on Eliquis, diastolic CHF, Parkinson's disease, colon adenoma who presented to the ED early this morning with complaint of rectal bleeding after colonoscopy. Patient had an outpatient colonoscopy at 11 AM yesterday 4/19.  He had 3 polyps removed.  When he went home, he had some stringy red stools around 4 PM.  Family called GI office and was told that if it worsened, he needed to go to the ED.  Around midnight, he then had another large maroon/blood mixed BM and hence presented to the ED.  Of note, Eliquis has been on hold for last 3 days prior to colonoscopy and patient has not yet resumed it.  In the ED, patient was afebrile, heart rate mostly 90s, breathing on room air, blood pressure mostly elevated to 130s. This morning, around 7 AM, patient had another large bloody stool.  Blood work showed hemoglobin initially at 16 which dropped to 14.2 in 2 hours.  INR 1.1.  Platelets 112. 1 unit of PRBC transfusion was given in ED. GI consultation was obtained.  Patient is planned for colonoscopy this afternoon. Hospitalist services involved for inpatient admission and management.  At the time of my evaluation, patient was propped up in bed.  Able to give brief history.   Wife at bedside who gives the details. Waiting for colonoscopy this afternoon.  **Interim History Underwent repeat colonoscopy he was found to have the ascending colon post polypectomy bleed status post endoscopic clipping in the descending colon polypectomy site was also clipped there is no active bleeding.  Overnight he became hypotensive and blood count had dropped 14.2 down to 10.4.  His blood pressure improved and his diet was advanced and he had no further bleeding.  Repeat hemoglobin was stable and he is stable for discharge.  Discharge Diagnoses:  Active Problems:   Acute GI bleeding   GI bleeding   Rectal bleeding  Acute lower GI bleeding  post colonoscopy bleeding -Presented with lower GI bleeding within 24 hours of colonoscopy. -3 polyps removed during colonoscopy yesterday. -Suspect bleeding from the polyp removal site. -Colonoscopy done as below -He improved and tensive overnight but is now improved.  We will hold his blood pressure medication for now resume on Saturday  Acute blood loss anemia -Patient's hemoglobin at baseline seems to be more than 16 in the past several readings. -Hemoglobin was 16 at presentation earlier this morning, dropped to 14.2 within 3 hours due to persistent bleeding and then further dropped to 10.4 -1 unit of PRBC was transfused in the ED. -Underwent repeat colonoscopy as below; hemoglobin/hematocrit remained stable and he is stable for discharge today.  Hypokalemia -Potassium low at 3.2 and improved to 3.6 -Continue to monitor and replete as necessary -Repeat CMP within 1 week  Cardiovascular issues: Atrial flutter/ HTN/ diastolic CHF/ mild  aortic insufficiency/ HLD -Home meds include atenolol, Eliquis, losartan/HCTZ, simvastatin. -Eliquis on hold for last 3 days prior to colonoscopy and GI recommending holding until December 28, 2019 -Give atenolol this morning to avoid RVR due to withdrawal -Keep losartan/HCTZ on hold and recommending  resuming on Saturday and cutting it in half -Continue simvastatin.  Leukocytosis -Likely reactive in the setting of above -WBC was 14.4 this morning and trended down to 11.39-continue monitor and trend and repeat CMP in the outpatient setting  Thrombocytopenia -In the setting of bleeding -Patient's platelet count dropped to 88,000 and repeat is now 94,000 -Due to monitor and repeat CBC in the outpatient setting  Parkinson's disease -Resume Sinemet post procedure and okay to resume now  Discharge Instructions  Discharge Instructions    Call MD for:  difficulty breathing, headache or visual disturbances   Complete by: As directed    Call MD for:  extreme fatigue   Complete by: As directed    Call MD for:  hives   Complete by: As directed    Call MD for:  persistant dizziness or light-headedness   Complete by: As directed    Call MD for:  persistant nausea and vomiting   Complete by: As directed    Call MD for:  redness, tenderness, or signs of infection (pain, swelling, redness, odor or green/yellow discharge around incision site)   Complete by: As directed    Call MD for:  severe uncontrolled pain   Complete by: As directed    Call MD for:  temperature >100.4   Complete by: As directed    Diet - low sodium heart healthy   Complete by: As directed    Discharge instructions   Complete by: As directed    You were cared for by a hospitalist during your hospital stay. If you have any questions about your discharge medications or the care you received while you were in the hospital after you are discharged, you can call the unit and ask to speak with the hospitalist on call if the hospitalist that took care of you is not available. Once you are discharged, your primary care physician will handle any further medical issues. Please note that NO REFILLS for any discharge medications will be authorized once you are discharged, as it is imperative that you return to your primary care  physician (or establish a relationship with a primary care physician if you do not have one) for your aftercare needs so that they can reassess your need for medications and monitor your lab values.  Follow up with PCP and Gastroenterology within 1-2 weeks. Take all medications as prescribed. If symptoms change or worsen please return to the ED for evaluation   Increase activity slowly   Complete by: As directed      Allergies as of 12/23/2019   No Known Allergies     Medication List    TAKE these medications   apixaban 5 MG Tabs tablet Commonly known as: Eliquis Take 1 tablet (5 mg total) by mouth 2 (two) times daily. Start taking on: December 28, 2019 What changed: These instructions start on December 28, 2019. If you are unsure what to do until then, ask your doctor or other care provider.   atenolol 25 MG tablet Commonly known as: TENORMIN TAKE 1 TABLET BY MOUTH DAILY   carbidopa-levodopa 50-200 MG tablet Commonly known as: SINEMET CR Take 1 tablet by mouth at bedtime.   carbidopa-levodopa 25-100 MG tablet Commonly known as: SINEMET IR  Take 1.5 tablets by mouth 3 (three) times daily.   donepezil 10 MG tablet Commonly known as: ARICEPT Take 1 tablet (10 mg total) by mouth at bedtime.   escitalopram 5 MG tablet Commonly known as: Lexapro Take 1 tablet (5 mg total) by mouth daily.   iron polysaccharides 150 MG capsule Commonly known as: NIFEREX Take 1 capsule (150 mg total) by mouth daily.   losartan-hydrochlorothiazide 100-12.5 MG tablet Commonly known as: HYZAAR Take 0.5 tablets by mouth daily. Start taking on: December 26, 2019 What changed: These instructions start on December 26, 2019. If you are unsure what to do until then, ask your doctor or other care provider.   senna 8.6 MG Tabs tablet Commonly known as: SENOKOT Take 1 tablet (8.6 mg total) by mouth 2 (two) times daily.   simvastatin 20 MG tablet Commonly known as: ZOCOR Take 1 tablet (20 mg total) by mouth at  bedtime.       No Known Allergies  Consultations:  Gastroenterology  Procedures/Studies: DG Chest Portable 1 View  Result Date: 12/22/2019 CLINICAL DATA:  Rectal bleeding following colonoscopy. EXAM: PORTABLE CHEST 1 VIEW COMPARISON:  One-view chest x-ray 01/17/2015 FINDINGS: Heart is enlarged. Atherosclerotic changes are noted at the arch. No edema or effusion is present. No focal airspace disease is present. Axial skeleton is within limits. IMPRESSION: 1. Cardiomegaly without failure. 2. Atherosclerosis. Electronically Signed   By: San Morelle M.D.   On: 12/22/2019 04:12   DG Abd Portable 2 Views  Result Date: 12/22/2019 CLINICAL DATA:  Rectal bleeding following colonoscopy. EXAM: PORTABLE ABDOMEN - 2 VIEW COMPARISON:  CT of the abdomen and pelvis 03/22/2013. FINDINGS: Gas is noted throughout the colon. No free air is present. No obstruction is present. Fluid layers within the colon. Degenerative changes are present in the lower lumbar spine with rightward curvature. IMPRESSION: Normal bowel gas pattern following colonoscopy without evidence for obstruction or free air. Electronically Signed   By: San Morelle M.D.   On: 12/22/2019 04:14    ENDOSCOPY Findings:      Difficult exam due to significant blood and clots throughout the colon.       Took a long time to wash and aspirate.      Stigmata of recent bleeding was seen in the proximal ascending colon       (visible vessel but no active bleeding), secondary to previous       polypectomy procedure. To stop active bleeding, two hemostatic clips       were successfully placed (MR conditional). There was no bleeding at the       end of the procedure.      Polypectomy site in the transverse colon was not visualized. It had       completely healed.      Polypectomy site in the descending colon did reveal a flat brown spot       and a superficial ulcer 6 to 8 mm. A single hemostatic clip was placed       across. There  was no bleeding at the end of the procedure.      Few small diverticula in the sigmoid colon. Impression:               -Ascending colon post polypectomy bleed s/p                            endoscopic clipping.                           -  Descending colon polypectomy site was also clipped.                           -No active bleeding towards the end of the                            procedure.  Subjective: And examined at bedside and patient was doing well.  Denies any chest pain, lightheadedness or dizziness.  No nausea or vomiting.  He is stable from a GI perspective to be discharged and had no further bleeding.  Globin remained stable and he feels well ambulated without issues and blood pressure actually improved prior to discharge so he will be discharged home and was told to hold his Eliquis until April 26 and will also hold his blood pressure medications and resume slowly.  Discharge Exam: Vitals:   12/23/19 0653 12/23/19 1206  BP: (!) 101/59 113/69  Pulse: 69 96  Resp: 20 18  Temp: 98.7 F (37.1 C) 98 F (36.7 C)  SpO2: 95% 98%   Vitals:   12/23/19 0329 12/23/19 0456 12/23/19 0653 12/23/19 1206  BP: (!) 88/54 (!) 84/60 (!) 101/59 113/69  Pulse: 65 68 69 96  Resp:   20 18  Temp:   98.7 F (37.1 C) 98 F (36.7 C)  TempSrc:   Oral Oral  SpO2:   95% 98%  Weight:      Height:       General: Pt is alert, awake, not in acute distress Cardiovascular: RRR, S1/S2 +, no rubs, no gallops Respiratory: Mlldy diminished bilaterally, no wheezing, no rhonchi Abdominal: Soft, NT, ND, bowel sounds + Extremities: no edema, no cyanosis  The results of significant diagnostics from this hospitalization (including imaging, microbiology, ancillary and laboratory) are listed below for reference.    Microbiology: Recent Results (from the past 240 hour(s))  Respiratory Panel by RT PCR (Flu A&B, Covid) - Nasopharyngeal Swab     Status: None   Collection Time: 12/22/19  6:10 AM   Specimen:  Nasopharyngeal Swab  Result Value Ref Range Status   SARS Coronavirus 2 by RT PCR NEGATIVE NEGATIVE Final    Comment: (NOTE) SARS-CoV-2 target nucleic acids are NOT DETECTED. The SARS-CoV-2 RNA is generally detectable in upper respiratoy specimens during the acute phase of infection. The lowest concentration of SARS-CoV-2 viral copies this assay can detect is 131 copies/mL. A negative result does not preclude SARS-Cov-2 infection and should not be used as the sole basis for treatment or other patient management decisions. A negative result may occur with  improper specimen collection/handling, submission of specimen other than nasopharyngeal swab, presence of viral mutation(s) within the areas targeted by this assay, and inadequate number of viral copies (<131 copies/mL). A negative result must be combined with clinical observations, patient history, and epidemiological information. The expected result is Negative. Fact Sheet for Patients:  PinkCheek.be Fact Sheet for Healthcare Providers:  GravelBags.it This test is not yet ap proved or cleared by the [place] FDA and  has been authorized for detection and/or diagnosis of SARS-CoV-2 by FDA under an Emergency Use Authorization (EUA). This EUA will remain  in effect (meaning this test can be used) for the duration of the COVID-19 declaration under Section 564(b)(1) of the Act, 21 U.S.C. section 360bbb-3(b)(1), unless the authorization is terminated or revoked sooner.    Influenza A by PCR NEGATIVE NEGATIVE Final   Influenza B  by PCR NEGATIVE NEGATIVE Final    Comment: (NOTE) The Xpert Xpress SARS-CoV-2/FLU/RSV assay is intended as an aid in  the diagnosis of influenza from Nasopharyngeal swab specimens and  should not be used as a sole basis for treatment. Nasal washings and  aspirates are unacceptable for Xpert Xpress SARS-CoV-2/FLU/RSV  testing. Fact Sheet for  Patients: PinkCheek.be Fact Sheet for Healthcare Providers: GravelBags.it This test is not yet approved or cleared by the [place] FDA and  has been authorized for detection and/or diagnosis of SARS-CoV-2 by  FDA under an Emergency Use Authorization (EUA). This EUA will remain  in effect (meaning this test can be used) for the duration of the  Covid-19 declaration under Section 564(b)(1) of the Act, 21  U.S.C. section 360bbb-3(b)(1), unless the authorization is  terminated or revoked. Performed at [facility], [place] [address], [place], [place] [address]     Labs: BNP (last 3 results) No results for input(s): BNP in the last 8760 hours. Basic Metabolic Panel: Recent Labs  Lab 12/22/19 0319 12/23/19 0550  NA 140 140  K 3.2* 3.6  CL 103 106  CO2 27 26  GLUCOSE 103* 119*  BUN 17 12  CREATININE 0.85 0.90  CALCIUM 9.3 8.3*   Liver Function Tests: Recent Labs  Lab 12/22/19 0319  AST 20  ALT <5  ALKPHOS 72  BILITOT 1.7*  PROT 6.8  ALBUMIN 4.2   No results for input(s): LIPASE, AMYLASE in the last 168 hours. No results for input(s): AMMONIA in the last 168 hours. CBC: Recent Labs  Lab 12/22/19 0319 12/22/19 0549 12/22/19 1105 12/23/19 0550 12/23/19 1340  WBC 8.2  --   --  14.4* 11.3*  NEUTROABS  --   --   --   --  8.9*  HGB 16.0 14.2 13.4 10.4* 10.4*  HCT 49.3 43.1  --  31.5* 32.6*  MCV 94.4  --   --  95.5 97.6  PLT 112*  --   --  88* 94*   Cardiac Enzymes: No results for input(s): CKTOTAL, CKMB, CKMBINDEX, TROPONINI in the last 168 hours. BNP: Invalid input(s): POCBNP CBG: No results for input(s): GLUCAP in the last 168 hours. D-Dimer No results for input(s): DDIMER in the last 72 hours. Hgb A1c No results for input(s): HGBA1C in the last 72 hours. Lipid Profile No results for input(s): CHOL, HDL, LDLCALC, TRIG, CHOLHDL, LDLDIRECT in the last 72 hours. Thyroid function  studies No results for input(s): TSH, T4TOTAL, T3FREE, THYROIDAB in the last 72 hours.  Invalid input(s): FREET3 Anemia work up No results for input(s): VITAMINB12, FOLATE, FERRITIN, TIBC, IRON, RETICCTPCT in the last 72 hours. Urinalysis    Component Value Date/Time   COLORURINE YELLOW 03/31/2013 0523   APPEARANCEUR TURBID (A) 03/31/2013 0523   LABSPEC 1.017 03/31/2013 0523   PHURINE 7.5 03/31/2013 0523   GLUCOSEU NEGATIVE 03/31/2013 0523   GLUCOSEU NEGATIVE 10/20/2008 0000   HGBUR MODERATE (A) 03/31/2013 0523   BILIRUBINUR NEGATIVE 03/31/2013 0523   KETONESUR NEGATIVE 03/31/2013 0523   PROTEINUR NEGATIVE 03/31/2013 0523   UROBILINOGEN 0.2 03/31/2013 0523   NITRITE NEGATIVE 03/31/2013 0523   LEUKOCYTESUR NEGATIVE 03/31/2013 0523   Sepsis Labs Invalid input(s): PROCALCITONIN,  WBC,  LACTICIDVEN Microbiology Recent Results (from the past 240 hour(s))  Respiratory Panel by RT PCR (Flu A&B, Covid) - Nasopharyngeal Swab     Status: None   Collection Time: 12/22/19  6:10 AM   Specimen: Nasopharyngeal Swab  Result Value Ref Range Status  SARS Coronavirus 2 by RT PCR NEGATIVE NEGATIVE Final    Comment: (NOTE) SARS-CoV-2 target nucleic acids are NOT DETECTED. The SARS-CoV-2 RNA is generally detectable in upper respiratoy specimens during the acute phase of infection. The lowest concentration of SARS-CoV-2 viral copies this assay can detect is 131 copies/mL. A negative result does not preclude SARS-Cov-2 infection and should not be used as the sole basis for treatment or other patient management decisions. A negative result may occur with  improper specimen collection/handling, submission of specimen other than nasopharyngeal swab, presence of viral mutation(s) within the areas targeted by this assay, and inadequate number of viral copies (<131 copies/mL). A negative result must be combined with clinical observations, patient history, and epidemiological information. The expected  result is Negative. Fact Sheet for Patients:  PinkCheek.be Fact Sheet for Healthcare Providers:  GravelBags.it This test is not yet ap proved or cleared by the [place] FDA and  has been authorized for detection and/or diagnosis of SARS-CoV-2 by FDA under an Emergency Use Authorization (EUA). This EUA will remain  in effect (meaning this test can be used) for the duration of the COVID-19 declaration under Section 564(b)(1) of the Act, 21 U.S.C. section 360bbb-3(b)(1), unless the authorization is terminated or revoked sooner.    Influenza A by PCR NEGATIVE NEGATIVE Final   Influenza B by PCR NEGATIVE NEGATIVE Final    Comment: (NOTE) The Xpert Xpress SARS-CoV-2/FLU/RSV assay is intended as an aid in  the diagnosis of influenza from Nasopharyngeal swab specimens and  should not be used as a sole basis for treatment. Nasal washings and  aspirates are unacceptable for Xpert Xpress SARS-CoV-2/FLU/RSV  testing. Fact Sheet for Patients: PinkCheek.be Fact Sheet for Healthcare Providers: GravelBags.it This test is not yet approved or cleared by the [place] FDA and  has been authorized for detection and/or diagnosis of SARS-CoV-2 by  FDA under an Emergency Use Authorization (EUA). This EUA will remain  in effect (meaning this test can be used) for the duration of the  Covid-19 declaration under Section 564(b)(1) of the Act, 21  U.S.C. section 360bbb-3(b)(1), unless the authorization is  terminated or revoked. Performed at [facility], [place] [address] [place], [place] [address]    Time coordinating discharge: 35 minutes  SIGNED:  Kerney Elbe, DO [facility] 12/23/2019, 8:26 PM Pager is on [email or web address]  If 7PM-7AM, please contact night-coverage [email or web address] Password [email or web address]

## 2019-12-25 NOTE — Telephone Encounter (Signed)
Contacted spouse, Vanessa Gunkel by phone.  Reports patient 'is doing well'.  Discussed issue of phone call over the weekend in which the spouse was unable to reach our physician on call to report increased symptoms of patient's GI bleeding.  Spouse took patient to the ED.   Informed spouse that we have been investing the concern and had found the rollover phone line was not directing patients to the answering service.  The problem had been corrected as of Monday morning and we are continuing to monitor the line and review possible issues that may have created a change in our rollover phone line.  Our supervisor, Barbie Haggis, plans to monitor the phone line each evening for proper rollover to emergency answering service.    Apologized to the wife for any additional stress this caused her and the patient and acknowledged our commitment to providing support for patients after hours and also to prevent this from occurring in the future.  Spouse was pleasant and acknowledged her understanding.  Instructed spouse to contact us if any further concerns regarding the patient and acknowledged our appreciation for bringing this to our attention.  Nothing further needed at this time. Routed to Dr. Fuller Plan for [facility]

## 2019-12-26 ENCOUNTER — Encounter (HOSPITAL_BASED_OUTPATIENT_CLINIC_OR_DEPARTMENT_OTHER): Payer: Self-pay

## 2019-12-26 ENCOUNTER — Emergency Department (HOSPITAL_BASED_OUTPATIENT_CLINIC_OR_DEPARTMENT_OTHER)
Admission: EM | Admit: 2019-12-26 | Discharge: 2019-12-26 | Disposition: A | Discharge status: Home / Self Care | Payer: Medicare Other | Attending: Emergency Medicine | Admitting: Emergency Medicine

## 2019-12-26 ENCOUNTER — Emergency Department (HOSPITAL_BASED_OUTPATIENT_CLINIC_OR_DEPARTMENT_OTHER): Payer: Medicare Other

## 2019-12-26 ENCOUNTER — Other Ambulatory Visit: Payer: Self-pay

## 2019-12-26 DIAGNOSIS — I82611 Acute embolism and thrombosis of superficial veins of right upper extremity: Secondary | ICD-10-CM | POA: Diagnosis not present

## 2019-12-26 DIAGNOSIS — Z79899 Other long term (current) drug therapy: Secondary | ICD-10-CM | POA: Diagnosis not present

## 2019-12-26 DIAGNOSIS — I808 Phlebitis and thrombophlebitis of other sites: Secondary | ICD-10-CM | POA: Diagnosis not present

## 2019-12-26 DIAGNOSIS — I10 Essential (primary) hypertension: Secondary | ICD-10-CM | POA: Diagnosis not present

## 2019-12-26 DIAGNOSIS — Z7901 Long term (current) use of anticoagulants: Secondary | ICD-10-CM | POA: Insufficient documentation

## 2019-12-26 DIAGNOSIS — G20 Parkinson's disease: Secondary | ICD-10-CM | POA: Diagnosis not present

## 2019-12-26 DIAGNOSIS — R2231 Localized swelling, mass and lump, right upper limb: Secondary | ICD-10-CM | POA: Diagnosis present

## 2019-12-26 DIAGNOSIS — I809 Phlebitis and thrombophlebitis of unspecified site: Secondary | ICD-10-CM

## 2019-12-26 MED ORDER — DICLOFENAC SODIUM 1 % EX GEL: 2.0000 g | Freq: Four times a day (QID) | CUTANEOUS | 0 refills | Status: DC

## 2019-12-26 MED ORDER — CEPHALEXIN 500 MG PO CAPS: 500.0000 mg | ORAL_CAPSULE | Freq: Two times a day (BID) | ORAL | 0 refills | Status: AC

## 2019-12-26 NOTE — ED Provider Notes (Signed)
[facility] EMERGENCY DEPARTMENT Provider Note   CSN: [id number] Arrival date & time: 12/26/19  1416     History Chief Complaint  Patient presents with  . Phlebitis    Carlos [name] is a 79 y.o. male.  HPI   79 year old male with a history of aortic insufficiency, atrial flutter, diastolic dysfunction, dyslipidemia, hemorrhoids, left ventricular hypertrophy, mitral regurg, PVCs, hypertension, who presents the emergency department today for evaluation of redness and swelling to the right upper extremity.  Patient was recently admitted to the hospital for rectal bleeding.  He had an IV in the right upper extremity.  Since being discharged home a few days ago he has noticed some mild pain and redness to the forearm.  He has had no fevers or swelling to the right upper extremity.  Past Medical History:  Diagnosis Date  . Allergic rhinitis   . Allergy   . Anxiety   . Aortic insufficiency    mild... echo... 02/2010  . Atrial flutter ([place]) 02/03/2010   Consult by Dr. Lovena Le... June, 2011.... plan rate control...  Coumadin not needed... ablation if rate not controlled well  . Basal cell carcinoma   . Chest pain    Nuclear, March, 2010, no ischemia  . Degenerative joint disease   . Diastolic dysfunction    EF 50-55%... echo.. 02/2010 (55-60% echo 2009)  . Dyslipidemia   . Ejection fraction    EF 50-55%, echo, June, 2011  . Hemorrhoids   . HEMORRHOIDS 10/21/2007   Qualifier: History of  By: [name], [name]   . HTN (hypertension)   . Incomplete RBBB    IRBBB; sinus bradycardia w/ PVCs  . LVH (left ventricular hypertrophy)    moderate... echo... 02/2010  . Mitral regurgitation    mild... echo.. 02/2010  . PVC (premature ventricular contraction)   . Renal calculus   . Sinus bradycardia   . Tubular adenoma of colon 08/2009    Patient Active Problem List   Diagnosis Date Noted  . Acute GI bleeding 12/22/2019  . GI bleeding 12/22/2019  . Rectal bleeding   .  Thrombocytopenia ([place]) 09/29/2019  . Primary osteoarthritis of right hip 06/19/2018  . Allergy 12/07/2016  . Parkinson's disease ([place]) 03/09/2016  . Sinus node dysfunction ([place]) 08/02/2015  . Aortic insufficiency 07/20/2015  . Hx of colonic polyps 01/13/2013  . Combined forms of age-related cataract 12/17/2012  . Presbyopia 12/17/2012  . Ptosis of eyelid 12/17/2012  . Vitreous syneresis 12/17/2012  . Incomplete RBBB   . Chest pain   . Essential hypertension   . Mitral regurgitation   . LVH (left ventricular hypertrophy)   . Diastolic dysfunction   . Sinus bradycardia   . PVC (premature ventricular contraction)   . Dyslipidemia   . PAF (paroxysmal atrial fibrillation) ([place]) 02/03/2010  . Allergic rhinitis 12/28/2009  . History of basal cell carcinoma of skin 10/26/2008  . Anxiety state 10/21/2007  . Osteoarthritis 10/21/2007  . RENAL CALCULUS, HX OF 10/20/2007    Past Surgical History:  Procedure Laterality Date  . COLONOSCOPY    . COLONOSCOPY WITH PROPOFOL N/A 12/22/2019   Procedure: COLONOSCOPY WITH PROPOFOL;  Surgeon: Jackquline Denmark, MD;  Location: [facility] ENDOSCOPY;  Service: Endoscopy;  Laterality: N/A;  . HEMOSTASIS CLIP PLACEMENT  12/22/2019   Procedure: HEMOSTASIS CLIP PLACEMENT;  Surgeon: Jackquline Denmark, MD;  Location: [facility] ENDOSCOPY;  Service: Endoscopy;;  . KIDNEY STONE SURGERY    . POLYPECTOMY  Family History  Problem Relation Age of Onset  . Hypertension Mother   . Stroke Mother 30       lived to 36  . Other Father        parkinsons ? 84  . Healthy Child   . Colon cancer Paternal Aunt   . Colon cancer Paternal Uncle     Social History   Tobacco Use  . Smoking status: Never Smoker  . Smokeless tobacco: Never Used  Substance Use Topics  . Alcohol use: Not Currently    Alcohol/week: 0.0 standard drinks    Comment: etoh; glass of wine one time a week   . Drug use: No    Home Medications Prior to Admission medications   Medication Sig Start Date  End Date Taking? Authorizing Provider  apixaban (ELIQUIS) 5 MG TABS tablet Take 1 tablet (5 mg total) by mouth 2 (two) times daily. 12/28/19   Sheikh, Georgina Quint Latif, DO  atenolol (TENORMIN) 25 MG tablet TAKE 1 TABLET BY MOUTH DAILY Patient taking differently: Take 25 mg by mouth daily.  07/09/19   [name], MD  carbidopa-levodopa (SINEMET CR) 50-200 MG tablet Take 1 tablet by mouth at bedtime. 10/27/19   Tat, Eustace Quail, DO  carbidopa-levodopa (SINEMET IR) 25-100 MG tablet Take 1.5 tablets by mouth 3 (three) times daily. 10/27/19   Tat, Eustace Quail, DO  cephALEXin (KEFLEX) 500 MG capsule Take 1 capsule (500 mg total) by mouth 2 (two) times daily for 7 days. 12/26/19 01/02/20  Lamondre Wesche S, PA-C  diclofenac Sodium (VOLTAREN) 1 % GEL Apply 2 g topically 4 (four) times daily. 12/26/19   Junko Ohagan S, PA-C  donepezil (ARICEPT) 10 MG tablet Take 1 tablet (10 mg total) by mouth at bedtime. 11/26/19   Tat, Eustace Quail, DO  escitalopram (LEXAPRO) 5 MG tablet Take 1 tablet (5 mg total) by mouth daily. 07/23/19   Tat, Eustace Quail, DO  iron polysaccharides (NIFEREX) 150 MG capsule Take 1 capsule (150 mg total) by mouth daily. 12/23/19   Raiford Noble Latif, DO  losartan-hydrochlorothiazide (HYZAAR) 100-12.5 MG tablet Take 0.5 tablets by mouth daily. 12/26/19   Raiford Noble Latif, DO  senna (SENOKOT) 8.6 MG TABS tablet Take 1 tablet (8.6 mg total) by mouth 2 (two) times daily. 12/23/19   Raiford Noble Latif, DO  simvastatin (ZOCOR) 20 MG tablet Take 1 tablet (20 mg total) by mouth at bedtime. 09/29/19   [name], MD    Allergies    Patient has no known allergies.  Review of Systems   Review of Systems  Constitutional: Negative for fever.  Musculoskeletal:       RUE pain  Skin: Positive for color change.  Neurological: Negative for weakness and numbness.    Physical Exam Updated Vital Signs BP 124/80 (BP Location: Right Arm)   Pulse 60   Temp 97.9 F (36.6 C) (Oral)   Resp 16   Ht 6'  (1.829 m)   Wt 78.9 kg   SpO2 100%   BMI 23.60 kg/m   Physical Exam Constitutional:      General: He is not in acute distress.    Appearance: He is well-developed.  Eyes:     Conjunctiva/sclera: Conjunctivae normal.  Cardiovascular:     Rate and Rhythm: Normal rate and regular rhythm.  Pulmonary:     Effort: Pulmonary effort is normal.     Breath sounds: Normal breath sounds.  Musculoskeletal:     Comments: Erythema, warmth and palpable  cord noted to the right forearm. No fluctuance or induration. Normal ROM of the RUE. Normal distal pulses.   Skin:    General: Skin is warm and dry.  Neurological:     Mental Status: He is alert and oriented to person, place, and time.     ED Results / Procedures / Treatments   Labs (all labs ordered are listed, but only abnormal results are displayed) Labs Reviewed - No data to display  EKG None  Radiology US Venous Img Upper Right (DVT Study)  Result Date: 12/26/2019 CLINICAL DATA:  [age]-year-old male with right upper extremity redness and swelling. Status post IV insertion. EXAM: Right UPPER EXTREMITY VENOUS DOPPLER ULTRASOUND TECHNIQUE: Gray-scale sonography with graded compression, as well as color Doppler and duplex ultrasound were performed to evaluate the upper extremity deep venous system from the level of the subclavian vein and including the jugular, axillary, basilic, radial, ulnar and upper cephalic vein. Spectral Doppler was utilized to evaluate flow at rest and with distal augmentation maneuvers. COMPARISON:  None. FINDINGS: Contralateral Subclavian Vein: Respiratory phasicity is normal and symmetric with the symptomatic side. No evidence of thrombus. Normal compressibility. Internal Jugular Vein: No evidence of thrombus. Normal compressibility, respiratory phasicity and response to augmentation. Subclavian Vein: No evidence of thrombus. Normal compressibility, respiratory phasicity and response to augmentation. Axillary Vein: No  evidence of thrombus. Normal compressibility, respiratory phasicity and response to augmentation. Cephalic Vein: There is complete occlusive thrombus in the cephalic vein with none compression of the vessel in the forearm. There is reconstitution of flow in the proximal upper arm. Basilic Vein: No evidence of thrombus. Normal compressibility, respiratory phasicity and response to augmentation. Brachial Veins: No evidence of thrombus. Normal compressibility, respiratory phasicity and response to augmentation. Radial Veins: No evidence of thrombus. Normal compressibility, respiratory phasicity and response to augmentation. Ulnar Veins: No evidence of thrombus. Normal compressibility, respiratory phasicity and response to augmentation. Venous Reflux:  None visualized. Other Findings:  None visualized. IMPRESSION: 1. No evidence of DVT within the right upper extremity. 2. Long segment thrombosis of the cephalic vein with reconstitution of flow in the proximal upper arm. Electronically Signed   By: Anner Crete M.D.   On: 12/26/2019 17:51    Procedures Procedures (including critical care time)  Medications Ordered in ED Medications - No data to display  ED Course  I have reviewed the triage vital signs and the nursing notes.  Pertinent labs & imaging results that were available during my care of the patient were reviewed by me and considered in my medical decision making (see chart for details).    MDM Rules/Calculators/A&P                       79 y/o M presenting with RUE swelling, redness, pain that started after being admitted and getting an IV in the forearm. No fevers at home.   Exam consistent with superficial thrombophlebitis. Will get [place] to r/o any evidence of DVT  RUE [place] No evidence of DVT within the right upper extremity. 2. Long segment thrombosis of the cephalic vein with reconstitution of flow in the proximal upper arm.   Advised antiinflammatories, warm compresses. This would be  low risk for propagation into the deep venous system and I discussed this with family. He is already anticoagulated and will restart eliquis in 2 days. Will give keflex given concern for possible infection. Advised on f/u and return precautions. Pt and wife voice understanding and are  in agreement with plan. All questions answered, pt stable for discharge.   Final Clinical Impression(s) / ED Diagnoses Final diagnoses:  Thrombophlebitis    Rx / DC Orders ED Discharge Orders         Ordered    cephALEXin (KEFLEX) 500 MG capsule  2 times daily     12/26/19 1810    diclofenac Sodium (VOLTAREN) 1 % GEL  4 times daily     12/26/19 [address], Sher Hellinger S, PA-C 12/26/19 1810    Gareth Morgan, MD 12/26/19 2253

## 2019-12-26 NOTE — ED Triage Notes (Signed)
Pt presents with red and swollen vein after having an IV from previous hospital admission.

## 2019-12-26 NOTE — Discharge Instructions (Signed)
Use the antiinflammatory gel as directed.   Use warm compresses several times per day to help reduce the swelling.   You can keep the arm elevated to help reduce the swelling.  Please follow up with your primary care provider within 5-7 days for re-evaluation of your symptoms. I  Please return to the emergency department for any new or worsening symptoms.

## 2019-12-28 ENCOUNTER — Telehealth: Payer: Self-pay | Admitting: Family Medicine

## 2019-12-28 NOTE — Telephone Encounter (Signed)
Called and lm on pt vm tcb, please find out if pt is wanting to be seen.

## 2019-12-28 NOTE — Telephone Encounter (Signed)
Called and spoke with pt wife and she requested a f/u appointment from colonoscopy/ER.

## 2019-12-28 NOTE — Telephone Encounter (Signed)
Return Phone Number [phone] (Primary) Chief Complaint Immunization Reaction Reason for Call Symptomatic / Request for [place] states her husband had a procedure recently. Caller states she had to take her husband to the ER due to uncontrollable bleeding. Caller states her husband is now experiencing redness, hardness and swelling around his injection site where a IV was placed. Translation No Nurse Assessment Nurse: Toy Cookey, RN, [facility] Date/Time Eilene Ghazi Time): 12/26/2019 12:27:31 PM Confirm and document reason for call. If symptomatic, describe symptoms. ---Caller states husband was in the hospital and had an IV from Monday until Wednesday. At that sight he is having redness, swelling, and feels hard. One area that is 3-4 inches long that is pink that follows the vein. Has the patient had close contact with a person known or suspected to have the novel coronavirus illness OR traveled / lives in area with major community spread (including international travel) in the last 14 days from the onset of symptoms? * If Asymptomatic, screen for exposure and travel within the last 14 days. ---No Does the patient have any new or worsening symptoms? ---Yes Will a triage be completed? ---Yes Related visit to physician within the last 2 weeks? ---Yes Does the PT have any chronic conditions? (i.e. diabetes, asthma, this includes High risk factors for pregnancy, etc.) ---Yes List chronic conditions. ---Parkinson, Dementia, A-fib. Is this a behavioral health or substance abuse call? ---NoPLEASE NOTE: All timestamps contained within this report are represented as [place] Standard Time. CONFIDENTIALTY NOTICE: This fax transmission is intended only for the addressee. It contains information that is legally privileged, confidential or otherwise protected from use or disclosure. If you are not the intended recipient, you are strictly prohibited from reviewing,  disclosing, copying using or disseminating any of this information or taking any action in reliance on or regarding this information. If you have received this fax in error, please notify us immediately by telephone so that we can arrange for its return to [place]. Phone: [phone], Toll-Free: [phone], Fax: [phone] Page: 2 of 2 Call Id: [id number] Guidelines Guideline Title Affirmed Question Affirmed Notes Nurse Date/Time Eilene Ghazi Time) IV Site (Skin) Symptoms [1] Red streak at IV site AND [2] longer than 1 inch (2.5 cm) Toy Cookey, RN, [facility] 12/26/2019 12:31:51 PM Disp. Time Eilene Ghazi Time) Disposition Final User 12/26/2019 12:34:42 PM See HCP within 4 Hours (or PCP triage) Yes Toy Cookey, RN, [facility] Disagree/Comply Comply Caller Understands Yes PreDisposition InappropriateToAsk Care Advice Given Per Guideline SEE HCP WITHIN 4 HOURS (OR PCP TRIAGE): * IF OFFICE WILL BE OPEN: You need to be seen within the next 3 or 4 hours. Call your doctor (or NP/PA) now or as soon as the office opens. DECREASE IV RATE TO KEEP VEIN OPEN (KVO): CALL BACK IF: * You become worse. CARE ADVICE given per IV Site Symptoms (Adult) guideline. Referrals [place]

## 2019-12-29 ENCOUNTER — Telehealth: Payer: Self-pay | Admitting: Gastroenterology

## 2019-12-29 ENCOUNTER — Encounter: Payer: Self-pay | Admitting: Gastroenterology

## 2019-12-29 NOTE — Telephone Encounter (Signed)
Patients wife is calling- states patient had colonoscopy by Dr. Fuller Plan on 4/19- was hospitalized and had another colonoscopy with Dr. Lyndel Safe on 4/20. She states she was told to follow up in 2 weeks with GI. She is asking what this appointment would be for/ if it is needed and who to follow up with. She wanted to discuss this all with you.

## 2019-12-29 NOTE — Telephone Encounter (Signed)
[name]'s questions answered.  He will come in and see Dr. Fuller Plan on 5/7

## 2019-12-30 ENCOUNTER — Ambulatory Visit (INDEPENDENT_AMBULATORY_CARE_PROVIDER_SITE_OTHER): Payer: Medicare Other | Admitting: Family Medicine

## 2019-12-30 ENCOUNTER — Other Ambulatory Visit: Payer: Self-pay

## 2019-12-30 ENCOUNTER — Encounter: Payer: Self-pay | Admitting: Family Medicine

## 2019-12-30 VITALS — BP 102/70 | HR 82 | Temp 98.1°F | Ht 72.0 in | Wt 176.2 lb

## 2019-12-30 DIAGNOSIS — I480 Paroxysmal atrial fibrillation: Secondary | ICD-10-CM

## 2019-12-30 DIAGNOSIS — E785 Hyperlipidemia, unspecified: Secondary | ICD-10-CM | POA: Diagnosis not present

## 2019-12-30 DIAGNOSIS — I10 Essential (primary) hypertension: Secondary | ICD-10-CM

## 2019-12-30 DIAGNOSIS — D649 Anemia, unspecified: Secondary | ICD-10-CM

## 2019-12-30 DIAGNOSIS — K625 Hemorrhage of anus and rectum: Secondary | ICD-10-CM

## 2019-12-30 DIAGNOSIS — I351 Nonrheumatic aortic (valve) insufficiency: Secondary | ICD-10-CM

## 2019-12-30 DIAGNOSIS — Z9889 Other specified postprocedural states: Secondary | ICD-10-CM

## 2019-12-30 DIAGNOSIS — G20 Parkinson's disease: Secondary | ICD-10-CM

## 2019-12-30 LAB — COMPREHENSIVE METABOLIC PANEL
ALT: 8 U/L (ref 0–53)
AST: 15 U/L (ref 0–37)
Albumin: 4.1 g/dL (ref 3.5–5.2)
Alkaline Phosphatase: 61 U/L (ref 39–117)
BUN: 16 mg/dL (ref 6–23)
CO2: 35 mEq/L — ABNORMAL HIGH (ref 19–32)
Calcium: 9.2 mg/dL (ref 8.4–10.5)
Chloride: 101 mEq/L (ref 96–112)
Creatinine, Ser: 0.88 mg/dL (ref 0.40–1.50)
GFR: 83.42 mL/min (ref >60.00)
Glucose, Bld: 89 mg/dL (ref 70–99)
Potassium: 3.7 mEq/L (ref 3.5–5.1)
Sodium: 142 mEq/L (ref 135–145)
Total Bilirubin: 0.9 mg/dL (ref 0.2–1.2)
Total Protein: 6.3 g/dL (ref 6.0–8.3)

## 2019-12-30 NOTE — Patient Instructions (Addendum)
Happy with improvement in arm. Let us know if you have any changes in symptoms.   We will get labs today. And make an appointment for labs one month to check up on Iron.   Make sure you do not take any anti inflammatory with Eliquis .     Hold Losartan-HCTZ until you have 2 days of consecutive blood pressures over 135 or above 150 for one day. Or if bottom number gets above 90.   Cholesterol: Stay on current medications.

## 2019-12-30 NOTE — Progress Notes (Signed)
Phone [phone] In person visit   Subjective:   Carlos [name] is a 79 y.o. year old very pleasant male patient who presents for/with See problem oriented charting Chief Complaint  Patient presents with  . Follow-up    [name] mentioned that she thinks he's doing well since being home. Blood pressure has been fine since being home.   . Medication Management    [name] would like to discuss how long he will need to be on Iron Polysaccharides.     This visit occurred during the SARS-CoV-2 public health emergency.  Safety protocols were in place, including screening questions prior to the visit, additional usage of staff PPE, and extensive cleaning of exam room while observing appropriate contact time as indicated for disinfecting solutions.   Past Medical History-  Patient Active Problem List   Diagnosis Date Noted  . Parkinson's disease ([place]) 03/09/2016    Priority: High  . PAF (paroxysmal atrial fibrillation) ([place]) 02/03/2010    Priority: High  . Aortic insufficiency 07/20/2015    Priority: Medium  . Chest pain     Priority: Medium  . Essential hypertension     Priority: Medium  . Mitral regurgitation     Priority: Medium  . Diastolic dysfunction     Priority: Medium  . Dyslipidemia     Priority: Medium  . Primary osteoarthritis of right hip 06/19/2018    Priority: Low  . Allergy 12/07/2016    Priority: Low  . Sinus node dysfunction (HCC) 08/02/2015    Priority: Low  . Hx of colonic polyps 01/13/2013    Priority: Low  . Incomplete RBBB     Priority: Low  . LVH (left ventricular hypertrophy)     Priority: Low  . Sinus bradycardia     Priority: Low  . PVC (premature ventricular contraction)     Priority: Low  . Allergic rhinitis 12/28/2009    Priority: Low  . History of basal cell carcinoma of skin 10/26/2008    Priority: Low  . Anxiety state 10/21/2007    Priority: Low  . Osteoarthritis 10/21/2007    Priority: Low  . RENAL CALCULUS, HX OF  10/20/2007    Priority: Low  . Acute GI bleeding 12/22/2019  . GI bleeding 12/22/2019  . Rectal bleeding   . Thrombocytopenia ([place]) 09/29/2019  . Combined forms of age-related cataract 12/17/2012  . Presbyopia 12/17/2012  . Ptosis of eyelid 12/17/2012  . Vitreous syneresis 12/17/2012    Medications- reviewed and updated Current Outpatient Medications  Medication Sig Dispense Refill  . apixaban (ELIQUIS) 5 MG TABS tablet Take 1 tablet (5 mg total) by mouth 2 (two) times daily. 180 tablet 3  . atenolol (TENORMIN) 25 MG tablet TAKE 1 TABLET BY MOUTH DAILY (Patient taking differently: Take 25 mg by mouth daily. ) 90 tablet 3  . carbidopa-levodopa (SINEMET CR) 50-200 MG tablet Take 1 tablet by mouth at bedtime. 90 tablet 1  . carbidopa-levodopa (SINEMET IR) 25-100 MG tablet Take 1.5 tablets by mouth 3 (three) times daily. 405 tablet 1  . cephALEXin (KEFLEX) 500 MG capsule Take 1 capsule (500 mg total) by mouth 2 (two) times daily for 7 days. 14 capsule 0  . diclofenac Sodium (VOLTAREN) 1 % GEL Apply 2 g topically 4 (four) times daily. 50 g 0  . donepezil (ARICEPT) 10 MG tablet Take 1 tablet (10 mg total) by mouth at bedtime. 90 tablet 3  . escitalopram (LEXAPRO) 5 MG tablet Take 1 tablet (5 mg  total) by mouth daily. 90 tablet 3  . iron polysaccharides (NIFEREX) 150 MG capsule Take 1 capsule (150 mg total) by mouth daily. 30 capsule 0  . losartan-hydrochlorothiazide (HYZAAR) 100-12.5 MG tablet Take 0.5 tablets by mouth daily. 46 tablet 3  . simvastatin (ZOCOR) 20 MG tablet Take 1 tablet (20 mg total) by mouth at bedtime. 90 tablet 3  . senna (SENOKOT) 8.6 MG TABS tablet Take 1 tablet (8.6 mg total) by mouth 2 (two) times daily. (Patient not taking: Reported on 12/30/2019) 120 tablet 0   No current facility-administered medications for this visit.     Objective:  BP 102/70   Pulse 82   Temp 98.1 F (36.7 C) (Temporal)   Ht 6' (1.829 m)   Wt 176 lb 3.2 oz (79.9 kg)   SpO2 98%   BMI  23.90 kg/m  Gen: NAD, resting comfortably CV: RRR no murmurs rubs or gallops Lungs: CTAB no crackles, wheeze, rhonchi Ext: no edema Skin: warm, dry     Assessment and Plan   # ED (12/26/18) and hospital follow up (rectal bleeding discharge 12/23/19) S: Patient had post polypectomy bleed after colonoscopy April 19.  He had 3 polyps removed.  When he got home he had stringy red stools around 4 PM-.  Around midnight patient had a large maroon/blood mixed bowel movement and presented to the emergency department.  Eliquis had been on hold the last 3 days prior to colonoscopy and patient had yet to resume the medication.  Patient had a hemoglobin level dropped from 16 down to 14.2/2 hours.  INR was normal at 1.1.  Platelets were mildly decreased at 112.  Patient was given 1 unit of packed red blood cells in the emergency department.  GI consult was obtained.  Patient had a colonoscopy again in the hospital-in the ascending colon patient had post polypectomy bleeding site-ultimately had a site clipped at the descending colon polypectomy site as well- 2 repairs.  Overnight patient became hypotensive and blood count dropped from 14.2-10.4.  Ultimately blood pressure improved and patient had no further bleeding.  Repeat hemoglobin was stable and he was deemed stable for discharge.  4 days later patient presented to the emergency department with superficial thrombophlebitis of the right arm-this has been steadily improving since that time.  Patient had ultrasound to rule out DVT but none was found-instead long segment thrombosis of cephalic vein was noted in the proximal upper arm.  Patient was advised anti-inflammatories and warm compresses-I discussed with patient my concern about anti-inflammatories today- they know not to take these with eliquis- instead they were told the keflex was more of antinflammatory. He was also started on keflex to treat potential bacterial superinfection per notes.   Plan was to  restart Eliquis in 2 days. He restarted eliquis 12/28/19 . No blood in stool. Darker stool as a result of iron.  A/P: Post polypectomy bleeding/rectal bleeding/anemia-so thankful rectal bleeding has stopped even with restarting Eliquis.  We are going to check a hemoglobin level today.  He is going to continue on iron for now. -Patient is going to return in 1 month for CBC and ferritin/iron studies-if levels are adequate we can consider stopping iron at that point versus refills  For superficial thrombophlebitis-the fact patient is on Eliquis should help resolve this.  I do not see an obvious bacterial superinfection but he will finish Keflex.  % Parkinson's disease/anxiety-follows with Dr. Carles Collet of neurology S:  He typically does rock steady boxing program- starting to  repoen but not fully.  Tries to do some walking and still mows his own lawn (other than with recent illness).  Compliant with Sinemet.  He is also compliant with Lexapro 5 mg from Dr. Carles Collet he is also on Aricept for memory changes A/P: Stable. Continue current medications.    #% Atrial fibrillation-also follows with Dr. Lovena Le of cardiology S: Patient is anticoagulated with Eliquis- restarted on monday.  Also on atenolol for rate control.  Doing well. A/P: Remains in atrial fibrillation-noted on EKG 12/25/2019-patient is appropriately anticoagulated and rate controlled with atenolol.  I do not want to stop the atenolol with the low blood pressure because I want to maintain current heart rate.  #hypertension S: Compliant with atenolol 25 mg as well as losartan hydrochlorothiazide 100-12.5 mg but only takes 1/2 tablet A/P: Blood pressure is overcontrolled with home numbers as low as 96/65 but ranging up to 119/71.  We opted to hold the losartan hydrochlorothiazide for now-if blood pressure gets above 135/90 2 days in a row or over [id number] systolic then we will go ahead and restart medication.  #Dyslipidemia S:  Compliant with simvastatin 40  mg--> reduced to 20mg  09/2019 due to LDL under 40.  LDL well controlled under 70 Lab Results  Component Value Date   CHOL 99 02/11/2019   HDL 53.30 02/11/2019   LDLCALC 37 02/11/2019   LDLDIRECT 37.0 12/17/2017   TRIG 41.0 02/11/2019   CHOLHDL 2 02/11/2019  A/P: Good control of cholesterol last check-continue current simvastatin 20 mg.  We discussed possibly switching to an agent like rosuvastatin but memory changes are much more likely related to Parkinson's and we opted ultimately to continue current dose since he has tolerated it well.   Recommended follow up: august visit or sooner if needed Future Appointments  Date Time Provider [place]  01/08/2020  3:40 PM Ladene Artist, MD [facility]  01/29/2020 11:00 AM [facility] LAB [facility]  03/25/2020  1:00 PM Tat, Eustace Quail, DO [facility] None  04/05/2020 11:20 AM [name] Brayton Mars, MD [facility]    Lab/Order associations:   ICD-10-CM   1. Rectal bleeding  K62.5 CBC with Differential/Platelet  2. Anemia, unspecified type  D64.9 CBC with Differential/Platelet    Iron, TIBC and Ferritin Panel  3. PAF (paroxysmal atrial fibrillation) (HCC)  I48.0   4. Essential hypertension  I10 Comprehensive metabolic panel  5. Dyslipidemia  E78.5   6. Parkinson's disease ([place])  G20   7. Aortic valve insufficiency, etiology of cardiac valve disease unspecified  I35.1   8. Status post colonoscopy with polypectomy  Z98.890    Return precautions advised.  Garret Reddish, MD

## 2019-12-31 LAB — CBC WITH DIFFERENTIAL/PLATELET
Basophils Absolute: 0 10*3/uL (ref 0.0–0.1)
Basophils Relative: 0.3 % (ref 0.0–3.0)
Eosinophils Absolute: 0.1 10*3/uL (ref 0.0–0.7)
Eosinophils Relative: 1.9 % (ref 0.0–5.0)
HCT: 35.9 % — ABNORMAL LOW (ref 39.0–52.0)
Hemoglobin: 12.1 g/dL — ABNORMAL LOW (ref 13.0–17.0)
Lymphocytes Relative: 25.5 % (ref 12.0–46.0)
Lymphs Abs: 1.6 10*3/uL (ref 0.7–4.0)
MCHC: 33.6 g/dL (ref 30.0–36.0)
MCV: 93.4 fl (ref 78.0–100.0)
Monocytes Absolute: 0.6 10*3/uL (ref 0.1–1.0)
Monocytes Relative: 9.6 % (ref 3.0–12.0)
Neutro Abs: 3.9 10*3/uL (ref 1.4–7.7)
Neutrophils Relative %: 62.7 % (ref 43.0–77.0)
Platelets: 183 10*3/uL (ref 150.0–400.0)
RBC: 3.85 Mil/uL — ABNORMAL LOW (ref 4.22–5.81)
RDW: 14.8 % (ref 11.5–15.5)
WBC: 6.2 10*3/uL (ref 4.0–10.5)

## 2020-01-08 ENCOUNTER — Ambulatory Visit: Payer: Medicare Other | Admitting: Gastroenterology

## 2020-01-08 ENCOUNTER — Other Ambulatory Visit: Payer: Self-pay

## 2020-01-08 ENCOUNTER — Encounter: Payer: Self-pay | Admitting: Gastroenterology

## 2020-01-08 VITALS — BP 98/64 | HR 69 | Ht 72.0 in | Wt 177.6 lb

## 2020-01-08 DIAGNOSIS — Z86010 Personal history of colonic polyps: Secondary | ICD-10-CM | POA: Diagnosis not present

## 2020-01-08 DIAGNOSIS — Z7901 Long term (current) use of anticoagulants: Secondary | ICD-10-CM | POA: Diagnosis not present

## 2020-01-08 NOTE — Progress Notes (Signed)
    History of Present Illness: This is a 79 year old male returning following hospitalization for post polypectomy bleed.  He is accompanied by his wife.  At repeat colonoscopy during hospitalization his ascending colon polypectomy site had a visible vessel without active bleeding which was clipped twice.  No recurrent bleeding.  Pathology revealed tubular adenomas and a sessile serrated polyp.  He subsequently developed a right arm superficial DVT.  He has resumed Eliquis.  He and his wife state his energy level is not back to his baseline and they were concerned it may be related to his anemia.  No blood noted in stool.  No abdominal pain.  His appetite is very good.  He has no gastrointestinal complaints.    Current Medications, Allergies, Past Medical History, Past Surgical History, Family History and Social History were reviewed in [employer] record.   Physical Exam: General: Well developed, well nourished, no acute distress Head: Normocephalic and atraumatic Eyes:  sclerae anicteric, EOMI Ears: Normal auditory acuity Mouth: Not examined, mask on during Covid-19 pandemic Lungs: Clear throughout to auscultation Heart: Regular rate and rhythm; no murmurs, rubs or bruits Abdomen: Soft, non tender and non distended. No masses, hepatosplenomegaly or hernias noted. Normal Bowel sounds Rectal: Not done Musculoskeletal: Symmetrical with no gross deformities  Pulses:  Normal pulses noted Extremities: No clubbing, cyanosis, edema or deformities noted Neurological: Alert oriented x 4, RUE tremor otherwise grossly nonfocal Psychological:  Alert and cooperative. Normal mood and affect   Assessment and Recommendations:  1.  Post polypectomy bleed, resolved.  Mild anemia.   Hemoglobin 12.1 on April 28. I do not feel his fatigue is related to his mild anemia.  Advised him to follow-up with his PCP.  Continue Nu-Iron daily for now.  Follow-up CBC ordered by PCP.  GI follow-up  as needed.  2.  Personal history of adenomatous colon polyps and a serrated sessile polyp.  Due to age and comorbidities no future plans for surveillance colonoscopies.  3.  A. fib maintained on Eliquis.  4.  Parkinson's disease.

## 2020-01-08 NOTE — Patient Instructions (Signed)
Follow up with your primary care physician and follow up with [place] as needed.  Thank you for choosing me and [facility].  Pricilla Riffle. Dagoberto Ligas., MD., Marval Regal

## 2020-01-15 ENCOUNTER — Other Ambulatory Visit: Payer: Self-pay | Admitting: Family Medicine

## 2020-01-29 ENCOUNTER — Other Ambulatory Visit: Payer: Self-pay

## 2020-01-29 ENCOUNTER — Other Ambulatory Visit (INDEPENDENT_AMBULATORY_CARE_PROVIDER_SITE_OTHER): Payer: Medicare Other

## 2020-01-29 DIAGNOSIS — D649 Anemia, unspecified: Secondary | ICD-10-CM | POA: Diagnosis not present

## 2020-01-29 LAB — CBC WITH DIFFERENTIAL/PLATELET
Basophils Absolute: 0.1 10*3/uL (ref 0.0–0.1)
Basophils Relative: 1.1 % (ref 0.0–3.0)
Eosinophils Absolute: 0.1 10*3/uL (ref 0.0–0.7)
Eosinophils Relative: 1.1 % (ref 0.0–5.0)
HCT: 41.7 % (ref 39.0–52.0)
Hemoglobin: 13.7 g/dL (ref 13.0–17.0)
Lymphocytes Relative: 24.6 % (ref 12.0–46.0)
Lymphs Abs: 1.3 10*3/uL (ref 0.7–4.0)
MCHC: 32.8 g/dL (ref 30.0–36.0)
MCV: 90.4 fl (ref 78.0–100.0)
Monocytes Absolute: 0.5 10*3/uL (ref 0.1–1.0)
Monocytes Relative: 9.7 % (ref 3.0–12.0)
Neutro Abs: 3.3 10*3/uL (ref 1.4–7.7)
Neutrophils Relative %: 63.5 % (ref 43.0–77.0)
Platelets: 146 10*3/uL — ABNORMAL LOW (ref 150.0–400.0)
RBC: 4.62 Mil/uL (ref 4.22–5.81)
RDW: 13.8 % (ref 11.5–15.5)
WBC: 5.2 10*3/uL (ref 4.0–10.5)

## 2020-01-30 LAB — IRON,TIBC AND FERRITIN PANEL
%SAT: 11 % — ABNORMAL LOW (ref 20–48)
Ferritin: 18 ng/mL — ABNORMAL LOW (ref 24–380)
Iron: 42 ug/dL — ABNORMAL LOW (ref 50–180)
TIBC: 370 ug/dL (ref 250–425)

## 2020-02-03 ENCOUNTER — Other Ambulatory Visit: Payer: Self-pay

## 2020-02-03 ENCOUNTER — Telehealth: Payer: Self-pay

## 2020-02-03 MED ORDER — POLYSACCHARIDE IRON COMPLEX 150 MG PO CAPS: 150.0000 mg | ORAL_CAPSULE | Freq: Every day | ORAL | 3 refills | Status: DC

## 2020-02-03 NOTE — Telephone Encounter (Signed)
Returned call and lm for pt tcb.

## 2020-02-03 NOTE — Telephone Encounter (Signed)
[name], Carlos [name], returning call to [place] for lab results. Please advise.
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
## 2020-03-23 ENCOUNTER — Other Ambulatory Visit: Payer: Self-pay | Admitting: Neurology

## 2020-03-25 ENCOUNTER — Ambulatory Visit: Payer: Medicare Other | Admitting: Neurology

## 2020-03-28 ENCOUNTER — Ambulatory Visit: Payer: Medicare Other | Admitting: Family Medicine

## 2020-03-29 NOTE — Patient Instructions (Addendum)
Please stop by lab before you go If you have mychart- we will send your results within 3 business days of [place] receiving them.  If you do not have mychart- we will call you about results within 5 business days of [place] receiving them.  *please note we are currently using [facility] which has a longer processing time than [place] typically so labs may not come back as quickly as in the past *please also note that you will see labs on mychart as soon as they post. I will later go in and write notes on them- will say "notes from Dr. [name]"  #anemia- still on iron once a day. May change to 2-3x a week if levels are stable or slightly improved  Recommended follow up: 3-4 months to check in on how things are going with increased lexapro and mourning process. Consider checking with hospice to see if they are doing any in person counseling

## 2020-03-29 NOTE — Progress Notes (Signed)
Phone: [phone]   Subjective:  Patient presents today for their annual physical. Chief complaint-noted.   See problem oriented charting- Review of Systems  Constitutional: Negative for chills and fever.  HENT: Negative for ear pain, hearing loss and tinnitus.   Eyes: Negative for blurred vision, double vision and photophobia.  Respiratory: Negative for cough, shortness of breath and wheezing.   Cardiovascular: Negative for chest pain, palpitations and leg swelling.  Gastrointestinal: Negative for abdominal pain, blood in stool, constipation, diarrhea, heartburn, nausea and vomiting.  Genitourinary: Negative for dysuria, frequency and urgency.  Musculoskeletal: Negative for back pain, joint pain and neck pain.  Skin: Negative for rash.  Neurological: Negative for dizziness, seizures, weakness and headaches.  Endo/Heme/Allergies: Bruises/bleeds easily.       No increased or change   Psychiatric/Behavioral: Negative for hallucinations and suicidal ideas. The patient does not have insomnia.    The following were reviewed and entered/updated in epic: Past Medical History:  Diagnosis Date  . Allergic rhinitis   . Allergy   . Anxiety   . Aortic insufficiency    mild... echo... 02/2010  . Atrial flutter ([place]) 02/03/2010   Consult by Dr. Lovena Le... June, 2011.... plan rate control...  Coumadin not needed... ablation if rate not controlled well  . Basal cell carcinoma   . Chest pain    Nuclear, March, 2010, no ischemia  . Degenerative joint disease   . Diastolic dysfunction    EF 50-55%... echo.. 02/2010 (55-60% echo 2009)  . Dyslipidemia   . Ejection fraction    EF 50-55%, echo, June, 2011  . Hemorrhoids   . HEMORRHOIDS 10/21/2007   Qualifier: History of  By: [name], [name]   . HTN (hypertension)   . Incomplete RBBB    IRBBB; sinus bradycardia w/ PVCs  . LVH (left ventricular hypertrophy)    moderate... echo... 02/2010  . Mitral regurgitation    mild... echo.. 02/2010  . PVC  (premature ventricular contraction)   . Renal calculus   . Sinus bradycardia   . Tubular adenoma of colon 08/2009   Patient Active Problem List   Diagnosis Date Noted  . Parkinson's disease ([place]) 03/09/2016    Priority: High  . PAF (paroxysmal atrial fibrillation) ([place]) 02/03/2010    Priority: High  . Aortic insufficiency 07/20/2015    Priority: Medium  . Chest pain     Priority: Medium  . Essential hypertension     Priority: Medium  . Mitral regurgitation     Priority: Medium  . Diastolic dysfunction     Priority: Medium  . Dyslipidemia     Priority: Medium  . Primary osteoarthritis of right hip 06/19/2018    Priority: Low  . Allergy 12/07/2016    Priority: Low  . Sinus node dysfunction ([facility]) 08/02/2015    Priority: Low  . Hx of colonic polyps 01/13/2013    Priority: Low  . Incomplete RBBB     Priority: Low  . LVH (left ventricular hypertrophy)     Priority: Low  . Sinus bradycardia     Priority: Low  . PVC (premature ventricular contraction)     Priority: Low  . Allergic rhinitis 12/28/2009    Priority: Low  . History of basal cell carcinoma of skin 10/26/2008    Priority: Low  . Anxiety state 10/21/2007    Priority: Low  . Osteoarthritis 10/21/2007    Priority: Low  . RENAL CALCULUS, HX OF 10/20/2007    Priority: Low  . Acute GI bleeding  12/22/2019  . GI bleeding 12/22/2019  . Rectal bleeding   . Thrombocytopenia ([place]) 09/29/2019  . Combined forms of age-related cataract 12/17/2012  . Presbyopia 12/17/2012  . Ptosis of eyelid 12/17/2012  . Vitreous syneresis 12/17/2012   Past Surgical History:  Procedure Laterality Date  . COLONOSCOPY    . COLONOSCOPY WITH PROPOFOL N/A 12/22/2019   Procedure: COLONOSCOPY WITH PROPOFOL;  Surgeon: Jackquline Denmark, MD;  Location: [facility];  Service: Endoscopy;  Laterality: N/A;  . HEMOSTASIS CLIP PLACEMENT  12/22/2019   Procedure: HEMOSTASIS CLIP PLACEMENT;  Surgeon: Jackquline Denmark, MD;  Location: [facility];  Service:  Endoscopy;;  . KIDNEY STONE SURGERY    . POLYPECTOMY      Family History  Problem Relation Age of Onset  . Hypertension Mother   . Stroke Mother 47       lived to 48  . Other Father        parkinsons ? 84  . Healthy Child   . Colon cancer Paternal Aunt   . Colon cancer Paternal Uncle      Medications- reviewed and updated Current Outpatient Medications  Medication Sig Dispense Refill  . apixaban (ELIQUIS) 5 MG TABS tablet Take 1 tablet (5 mg total) by mouth 2 (two) times daily. 180 tablet 3  . atenolol (TENORMIN) 25 MG tablet TAKE 1 TABLET BY MOUTH DAILY (Patient taking differently: Take 25 mg by mouth daily. ) 90 tablet 3  . carbidopa-levodopa (SINEMET CR) 50-200 MG tablet Take 1 tablet by mouth at bedtime. 90 tablet 1  . carbidopa-levodopa (SINEMET IR) 25-100 MG tablet Take 2 tablets by mouth 3 (three) times daily. 180 tablet 0  . donepezil (ARICEPT) 10 MG tablet Take 1 tablet (10 mg total) by mouth at bedtime. 90 tablet 3  . escitalopram (LEXAPRO) 10 MG tablet Take 1 tablet (10 mg total) by mouth daily. 90 tablet 1  . iron polysaccharides (NIFEREX) 150 MG capsule Take 1 capsule (150 mg total) by mouth daily. 30 capsule 3  . simvastatin (ZOCOR) 20 MG tablet Take 1 tablet (20 mg total) by mouth at bedtime. 90 tablet 3   No current facility-administered medications for this visit.    Allergies-reviewed and updated No Known Allergies  Social History   Social History Narrative   Married- wife patient of Dr. [name]. Son and daughter in 78s in 2018. 5 13 year old grandchild.  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 Active with parkinsons exercise class- boxing and cycling   Objective  Objective:  BP 100/62   Pulse 60   Temp 98.4 F (36.9 C) (Temporal)   Ht 6' (1.829 m)   Wt 178 lb (80.7 kg)   SpO2 95%   BMI 24.14 kg/m  Gen: NAD, resting  comfortably HEENT: Mucous membranes are moist. Oropharynx normal Neck: no thyromegaly CV: RRR no murmurs rubs or gallops Lungs: CTAB no crackles, wheeze, rhonchi Abdomen: soft/nontender/nondistended/normal bowel sounds. No rebound or guarding.  Ext: no edema Skin: warm, dry Neuro: resting tremor right hand, slow gait , moves all extremities, PERRLA    Assessment and Plan  79 y.o. male presenting for annual physical.  Health Maintenance counseling: 1. Anticipatory guidance: Patient counseled regarding regular dental exams q6 months, eye exams every two yearly,  avoiding smoking and second hand smoke , limiting alcohol to 2 beverages or less  per day -  Does not drink  2. Risk factor reduction:  Advised patient of need for regular exercise and diet rich and fruits and vegetables to reduce risk of heart attack and stroke. Exercise- Has done boxing in the past but has not been after covid. He has not been doing much at all. Walks when weather will let.  Diet-Has healthy diet.  Despite decreased exercise has been able to maintain weight Wt Readings from Last 3 Encounters:  04/05/20 178 lb (80.7 kg)  04/04/20 180 lb (81.6 kg)  01/08/20 177 lb 9.6 oz (80.6 kg)  3. Immunizations/screenings/ancillary studies-fully up-to-date other than recommended fall flu shot  Immunization History  Administered Date(s) Administered  . Influenza Split 06/24/2011, 06/03/2012, 06/08/2013, 07/12/2014  . Influenza Whole 10/23/2009, 06/28/2010  . Influenza, High Dose Seasonal PF 07/09/2016, 06/20/2017, 05/20/2018, 06/06/2019  . Influenza-Unspecified 07/06/2015  . PFIZER SARS-COV-2 Vaccination 09/22/2019, 10/13/2019  . Pneumococcal Conjugate-13 07/19/2014  . Pneumococcal Polysaccharide-23 12/07/2016  . Tdap 09/03/2009, 01/02/2012  . Zoster Recombinat (Shingrix) 02/03/2018, 06/13/2018   4. Prostate cancer screening- past age based screening recommendations-prior PSA trend was low risk Lab Results  Component Value  Date   PSA 2.560 12/13/2017   PSA 2.24 02/06/2016   PSA 2.10 01/17/2015   5. Colon cancer screening - had recent colonoscopy and had post polypectomy bleeding-has not had recurrence despite being on Eliquis.  Will update CBC today. No further colonoscopy.  6. Skin cancer screening-Followed by dermatology due to history of skin cancer. No issues.   advised regular sunscreen use. Denies worrisome, changing, or new skin lesions.  7. never smoker 8. STD screening - Declines today as monogamous  Status of chronic or acute concerns   #Social update-lost another good friend recently (neighbor died of heart attack) and has to attend a funeral tomorrow.  Previously had lost 2 family members first d egree at least through marriage as well as close friend x2 (friends from boxing), cat.   COVID-19 has been tough on him not doing his boxing class and even hard to return after losses- refuses to go back at this point.   % Parkinson's disease/anxiety-follows with Dr. Carles Collet of neurology S:    Tries to do some walking and still mows his own lawn- heat is causing some set backs.  Compliant with Sinemet-recently increased.  He is also compliant with Lexapro 5 mg -->10 mg as of yesterdayy from Dr. Carles Collet he is also on Aricept for memory changes. Considering counseling but doesn't do well with virtual.  A/P: anxiety/depressed mood- hopefully sympotms are going to improve with increased lexapro to 10mg  but I think adding counselign would help as well- looking for in person options.   - increased sinemet for parkinsons- hoping to see improvement  #% Atrial fibrillation-also follows with Dr. Lovena Le of cardiology S: Patient is anticoagulated with Eliquis.  Also on atenolol for rate control.  He is .  Asymptomatic and appears to remain in atrial fibrillation but obviously we cannot prove that without EKG A/P: Stable. Continue current medications.   #hypertension S: Compliant with atenolol 25 mg only. Prior on  losartan  hydrochlorothiazide 100-12.5 mg 1/2 tablet but stopped after GI bleed -home BP max 120s.  BP Readings from Last 3 Encounters:  04/05/20 100/62  04/04/20 (!) 129/87  01/08/20 98/64   A/P: stable on atenolol alone- continue current meds   #Dyslipidemia S:  Compliant with simvastatin 40 mg--> reduced to 20mg  09/2019 due to LDL under 40.  LDL goalunder 70  Lab Results  Component Value Date   CHOL 99 02/11/2019   HDL 53.30 02/11/2019   LDLCALC 37 02/11/2019   LDLDIRECT 37.0 12/17/2017   TRIG 41.0 02/11/2019   CHOLHDL 2 02/11/2019  A/P: we will update lipid panel today- hoping for  Good control with LDL under 70   % Aortic insufficiency and mitral regurgitation- patient treated with dental prophylaxis in the past- still doing this  #anemia- still on iron once a day. May change to 2-3x a week if levels are stable or slightly improved  Recommended follow up: 3-4 months to check in on how things are going with increased lexapro and mourning process. Consider checking with hospice to see if they are doing any in person counseling Future Appointments  Date Time Provider [place]  10/05/2020 11:15 AM Tat, Eustace Quail, DO [facility] None   Lab/Order associations: fasting   ICD-10-CM   1. Essential hypertension  I10 CBC with Differential/Platelet    Comprehensive metabolic panel  2. Anxiety state  F41.1   3. Dyslipidemia  E78.5 Lipid panel  4. Preventative health care  Z00.00 CBC with Differential/Platelet    Comprehensive metabolic panel    Lipid panel    No orders of the defined types were placed in this encounter.   Return precautions advised.  Garret Reddish, MD

## 2020-04-04 ENCOUNTER — Other Ambulatory Visit: Payer: Self-pay

## 2020-04-04 ENCOUNTER — Encounter: Payer: Self-pay | Admitting: Neurology

## 2020-04-04 ENCOUNTER — Ambulatory Visit: Payer: Medicare Other | Admitting: Neurology

## 2020-04-04 VITALS — BP 129/87 | HR 73 | Ht 72.0 in | Wt 180.0 lb

## 2020-04-04 DIAGNOSIS — G20 Parkinson's disease: Secondary | ICD-10-CM

## 2020-04-04 DIAGNOSIS — F411 Generalized anxiety disorder: Secondary | ICD-10-CM | POA: Diagnosis not present

## 2020-04-04 DIAGNOSIS — F0280 Dementia in other diseases classified elsewhere without behavioral disturbance: Secondary | ICD-10-CM | POA: Diagnosis not present

## 2020-04-04 MED ORDER — CARBIDOPA-LEVODOPA 25-100 MG PO TABS: 2.0000 | ORAL_TABLET | Freq: Three times a day (TID) | ORAL | 0 refills | Status: DC

## 2020-04-04 MED ORDER — ESCITALOPRAM OXALATE 10 MG PO TABS: 10.0000 mg | ORAL_TABLET | Freq: Every day | ORAL | 1 refills | Status: DC

## 2020-04-04 NOTE — Patient Instructions (Addendum)
1.  Increase carbidopa/levodopa 25/100, 2 tablets three times per day 2.  Increase lexapro to 10 mg daily 3.  Let me know if you want referral for the online counseling 4.  I recommend PT and exercise  The physicians and staff at [facility] are committed to providing excellent care. You may receive a survey requesting feedback about your experience at our office. We strive to receive "very good" responses to the survey questions. If you feel that your experience would prevent you from giving the office a "very good " response, please contact our office to try to remedy the situation. We may be reached at [phone]. Thank you for taking the time out of your busy day to complete the survey.

## 2020-04-05 ENCOUNTER — Ambulatory Visit (INDEPENDENT_AMBULATORY_CARE_PROVIDER_SITE_OTHER): Payer: Medicare Other | Admitting: Family Medicine

## 2020-04-05 ENCOUNTER — Encounter: Payer: Self-pay | Admitting: Family Medicine

## 2020-04-05 VITALS — BP 100/62 | HR 60 | Temp 98.4°F | Ht 72.0 in | Wt 178.0 lb

## 2020-04-05 DIAGNOSIS — F411 Generalized anxiety disorder: Secondary | ICD-10-CM

## 2020-04-05 DIAGNOSIS — D649 Anemia, unspecified: Secondary | ICD-10-CM | POA: Diagnosis not present

## 2020-04-05 DIAGNOSIS — I10 Essential (primary) hypertension: Secondary | ICD-10-CM

## 2020-04-05 DIAGNOSIS — E785 Hyperlipidemia, unspecified: Secondary | ICD-10-CM

## 2020-04-05 DIAGNOSIS — Z0000 Encounter for general adult medical examination without abnormal findings: Secondary | ICD-10-CM

## 2020-04-06 LAB — COMPREHENSIVE METABOLIC PANEL
AG Ratio: 2 (calc) (ref 1.0–2.5)
ALT: 6 U/L — ABNORMAL LOW (ref 9–46)
AST: 16 U/L (ref 10–35)
Albumin: 4.5 g/dL (ref 3.6–5.1)
Alkaline phosphatase (APISO): 72 U/L (ref 35–144)
BUN: 19 mg/dL (ref 7–25)
CO2: 29 mmol/L (ref 20–32)
Calcium: 9.7 mg/dL (ref 8.6–10.3)
Chloride: 100 mmol/L (ref 98–110)
Creat: 1.02 mg/dL (ref 0.70–1.18)
Globulin: 2.3 g/dL (calc) (ref 1.9–3.7)
Glucose, Bld: 86 mg/dL (ref 65–99)
Potassium: 4.1 mmol/L (ref 3.5–5.3)
Sodium: 141 mmol/L (ref 135–146)
Total Bilirubin: 2 mg/dL — ABNORMAL HIGH (ref 0.2–1.2)
Total Protein: 6.8 g/dL (ref 6.1–8.1)

## 2020-04-06 LAB — CBC WITH DIFFERENTIAL/PLATELET
Absolute Monocytes: 507 cells/uL (ref 200–950)
Basophils Absolute: 51 cells/uL (ref 0–200)
Basophils Relative: 0.9 %
Eosinophils Absolute: 40 cells/uL (ref 15–500)
Eosinophils Relative: 0.7 %
HCT: 50.3 % — ABNORMAL HIGH (ref 38.5–50.0)
Hemoglobin: 15.5 g/dL (ref 13.2–17.1)
Lymphs Abs: 1585 cells/uL (ref 850–3900)
MCH: 27.7 pg (ref 27.0–33.0)
MCHC: 30.8 g/dL — ABNORMAL LOW (ref 32.0–36.0)
MCV: 89.8 fL (ref 80.0–100.0)
MPV: 12.9 fL — ABNORMAL HIGH (ref 7.5–12.5)
Monocytes Relative: 8.9 %
Neutro Abs: 3517 cells/uL (ref 1500–7800)
Neutrophils Relative %: 61.7 %
Platelets: 133 10*3/uL — ABNORMAL LOW (ref 140–400)
RBC: 5.6 10*6/uL (ref 4.20–5.80)
RDW: 13.3 % (ref 11.0–15.0)
Total Lymphocyte: 27.8 %
WBC: 5.7 10*3/uL (ref 3.8–10.8)

## 2020-04-06 LAB — IRON,TIBC AND FERRITIN PANEL
%SAT: 52 % (calc) — ABNORMAL HIGH (ref 20–48)
Ferritin: 33 ng/mL (ref 24–380)
Iron: 204 ug/dL — ABNORMAL HIGH (ref 50–180)
TIBC: 396 mcg/dL (calc) (ref 250–425)

## 2020-04-06 LAB — LIPID PANEL
Cholesterol: 93 mg/dL (ref <200)
HDL: 49 mg/dL (ref >=40)
LDL Cholesterol (Calc): 31 mg/dL (calc)
Non-HDL Cholesterol (Calc): 44 mg/dL (calc) (ref <130)
Total CHOL/HDL Ratio: 1.9 (calc) (ref <5.0)
Triglycerides: 58 mg/dL (ref <150)

## 2020-04-07 ENCOUNTER — Other Ambulatory Visit: Payer: Self-pay

## 2020-04-07 MED ORDER — SIMVASTATIN 10 MG PO TABS: 10.0000 mg | ORAL_TABLET | Freq: Every day | ORAL | 3 refills | Status: DC

## 2020-04-17 ENCOUNTER — Other Ambulatory Visit: Payer: Self-pay | Admitting: Neurology

## 2020-04-24 ENCOUNTER — Other Ambulatory Visit: Payer: Self-pay | Admitting: Neurology

## 2020-04-25 ENCOUNTER — Other Ambulatory Visit: Payer: Self-pay | Admitting: Neurology

## 2020-04-25 MED ORDER — CARBIDOPA-LEVODOPA ER 50-200 MG PO TBCR: 1.0000 | EXTENDED_RELEASE_TABLET | Freq: Every day | ORAL | 1 refills | Status: DC

## 2020-04-25 NOTE — Telephone Encounter (Signed)
Patient wife states that the patient needs to get a refill on the Carbidopa levodopa 50-200 mg. She states that they only have one pill left and he will use it tonight  They use the [facility] in [place]

## 2020-04-25 NOTE — Telephone Encounter (Signed)
Rx(s) sent to pharmacy electronically.  Left detailed message stating rx has been sent over the pharmacy, ok per DPR, and to call back if any questions.

## 2020-05-13 LAB — IRON,TIBC AND FERRITIN PANEL
%SAT: 36.6
Ferritin: 42
Iron: 153
TIBC: 419

## 2020-05-13 LAB — BASIC METABOLIC PANEL
BUN: 22 — AB (ref 4–21)
CO2: 34 — AB (ref 13–22)
Chloride: 104 (ref 99–108)
Creatinine: 1.1 (ref 0.6–1.3)
Glucose: 93
Potassium: 3.7 (ref 3.4–5.3)
Sodium: 137 (ref 137–147)

## 2020-05-13 LAB — HEMOGLOBIN A1C: Hemoglobin A1C: 5.8

## 2020-05-13 LAB — COMPREHENSIVE METABOLIC PANEL: Albumin: 4.1 (ref 3.5–5.0)

## 2020-05-13 LAB — CBC AND DIFFERENTIAL
HCT: 50 (ref 41–53)
Hemoglobin: 16.5 (ref 13.5–17.5)
Platelets: 122 — AB (ref 150–399)
WBC: 5.4

## 2020-05-13 LAB — LIPID PANEL
Cholesterol: 103 (ref 0–200)
HDL: 58 (ref 35–70)
LDL Cholesterol: 35
Triglycerides: 49 (ref 40–160)

## 2020-05-13 LAB — TSH: TSH: 1.72 (ref 0.41–5.90)

## 2020-05-13 LAB — HEPATIC FUNCTION PANEL
ALT: 12 (ref 10–40)
AST: 21 (ref 14–40)
Bilirubin, Total: 2.1

## 2020-05-13 LAB — CBC: RBC: 5.7 — AB (ref 3.87–5.11)

## 2020-05-24 DIAGNOSIS — R6889 Other general symptoms and signs: Secondary | ICD-10-CM | POA: Diagnosis not present

## 2020-07-05 NOTE — Patient Instructions (Addendum)
Great to see you today!   Glad things are going better- sorry for the loss of your brother in law.   No changes today  If you would like can also sign up for a wellness visit with our nurse Otila Kluver- this can typically be by phone or video

## 2020-07-05 NOTE — Progress Notes (Signed)
Phone [phone] In person visit   Subjective:   Carlos [name] is a 79 y.o. year old very pleasant male patient who presents for/with See problem oriented charting Chief Complaint  Patient presents with  . Hypertension  . Parkinson's Disease   This visit occurred during the SARS-CoV-2 public health emergency.  Safety protocols were in place, including screening questions prior to the visit, additional usage of staff PPE, and extensive cleaning of exam room while observing appropriate contact time as indicated for disinfecting solutions.   Past Medical History-  Patient Active Problem List   Diagnosis Date Noted  . Parkinson's disease ([place]) 03/09/2016    Priority: High  . PAF (paroxysmal atrial fibrillation) ([place]) 02/03/2010    Priority: High  . Aortic insufficiency 07/20/2015    Priority: Medium  . Chest pain     Priority: Medium  . Essential hypertension     Priority: Medium  . Mitral regurgitation     Priority: Medium  . Diastolic dysfunction     Priority: Medium  . Dyslipidemia     Priority: Medium  . Primary osteoarthritis of right hip 06/19/2018    Priority: Low  . Allergy 12/07/2016    Priority: Low  . Sinus node dysfunction (HCC) 08/02/2015    Priority: Low  . Hx of colonic polyps 01/13/2013    Priority: Low  . Incomplete RBBB     Priority: Low  . LVH (left ventricular hypertrophy)     Priority: Low  . Sinus bradycardia     Priority: Low  . PVC (premature ventricular contraction)     Priority: Low  . Allergic rhinitis 12/28/2009    Priority: Low  . History of basal cell carcinoma of skin 10/26/2008    Priority: Low  . Anxiety state 10/21/2007    Priority: Low  . Osteoarthritis 10/21/2007    Priority: Low  . RENAL CALCULUS, HX OF 10/20/2007    Priority: Low  . Acute GI bleeding 12/22/2019  . GI bleeding 12/22/2019  . Rectal bleeding   . Thrombocytopenia ([place]) 09/29/2019  . Combined forms of age-related cataract 12/17/2012  . Presbyopia  12/17/2012  . Ptosis of eyelid 12/17/2012  . Vitreous syneresis 12/17/2012    Medications- reviewed and updated Current Outpatient Medications  Medication Sig Dispense Refill  . apixaban (ELIQUIS) 5 MG TABS tablet Take 1 tablet (5 mg total) by mouth 2 (two) times daily. 180 tablet 3  . atenolol (TENORMIN) 25 MG tablet TAKE 1 TABLET BY MOUTH DAILY (Patient taking differently: Take 25 mg by mouth daily. ) 90 tablet 3  . carbidopa-levodopa (SINEMET CR) 50-200 MG tablet Take 1 tablet by mouth at bedtime. 90 tablet 1  . carbidopa-levodopa (SINEMET IR) 25-100 MG tablet TAKE 1 AND 1/2 TABLETS BY MOUTH AT 7 AM AND AT 11 AM AND AT 4 PM 405 tablet 1  . donepezil (ARICEPT) 10 MG tablet Take 1 tablet (10 mg total) by mouth at bedtime. 90 tablet 3  . escitalopram (LEXAPRO) 10 MG tablet Take 1 tablet (10 mg total) by mouth daily. 90 tablet 1  . iron polysaccharides (NIFEREX) 150 MG capsule Take 1 capsule (150 mg total) by mouth daily. 30 capsule 3  . simvastatin (ZOCOR) 10 MG tablet Take 1 tablet (10 mg total) by mouth at bedtime. (Patient taking differently: Take 20 mg by mouth at bedtime. ) 90 tablet 3   No current facility-administered medications for this visit.     Objective:  BP 130/80   Pulse 66  Temp 98.1 F (36.7 C) (Temporal)   Resp 18   Ht 6' (1.829 m)   Wt 180 lb 3.2 oz (81.7 kg)   SpO2 96%   BMI 24.44 kg/m  Gen: NAD, resting comfortably CV: RRR no murmurs rubs or gallops Lungs: CTAB no crackles, wheeze, rhonchi Abdomen: soft/nontender/nondistended/normal bowel sounds.  Ext: no edema Skin: warm, dry Neuro: tremor noted    Assessment and Plan   # Depression S: Medication:increasing lexapro form 5 to 10 mg has been helpful as sent in by Dr. Carles Collet.    Prior lost neighbor that he was close with in July.  lost brother in law 3 weeks ago november 2021. Prior lost some friends from [employer] boxing class (looking for other outlets) A/P: we discussed possibly doing phq9 but with  just losing brother wants to hold off on phq9 in addition to feeling better prior to the loss. Did do 8 sessions with exercise at the [place] which is a really good sign. Also outdoors more working with leave.s   % Parkinson's disease/anxiety-follows with Dr. Carles Collet of neurology S:  Still mows his own lawn/rather flat. 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   Compliant with Sinemet.  He is also compliant with Lexapro 10 mg from Dr. Carles Collet he is also on Aricept for memory changes -tremor with parkinsons bothers him and worse with anxiety A/P: seems stable- continue current medicine   #% Atrial fibrillation-also follows with Dr. Lovena Le of cardiology S: Patient is anticoagulated with Eliquis 5 mg BID.  Also on atenolo 25 mgl for rate control.  No chest pain, shortness of breath, palpitations. A/P: Stable. Continue current medications.    #hypertension S: Compliant with atenolol 25 mg. Prior on losartan hydrochlorothiazide 100-12.5 mg but only takes 1/2 tablet- stopped after GI bleed and BP did not substantially increase. A/P: doing well despite losartan hctz being stopped. Continue atenolol alone.    #Dyslipidemia S:  Compliant with simvastatin 40 mg--> reduced to 20mg  09/2019 due to LDL under 40.  LDL well controlled under 70, now down to just 10 mg and [facility] still [facility].  A/P: LDL continues to be controlled per [place] labs despite simvastatin down from 40 to 10mg - will continue current rx.   # Hyperglycemia/insulin resistance/prediabetes- [facility] 5.11 May [facility] S:  Medication: none Exercise and diet- increasing lately A/P: hopefully stable- likely check a1c next labs  # high baseline bilirubin  - noted on [place] labs largely stable. If worsening could consider further workup   % Aortic insufficiency and mitral regurgitation- patient treated with dental prophylaxis in the past  Recommended follow up:  Return in  about 6 months (around 01/05/2021) for physical or sooner if needed. Future Appointments  Date Time Provider [place]  10/05/2020 11:15 AM Tat, Eustace Quail, DO [facility] None   Lab/Order associations:   ICD-10-CM   1. Parkinson's disease ([place])  G20   2. PAF (paroxysmal atrial fibrillation) (HCC)  I48.0   3. Essential hypertension  I10   4. Dyslipidemia  E78.5    Return precautions advised.  Garret Reddish, MD

## 2020-07-08 ENCOUNTER — Encounter: Payer: Self-pay | Admitting: Family Medicine

## 2020-07-08 ENCOUNTER — Other Ambulatory Visit: Payer: Self-pay

## 2020-07-08 ENCOUNTER — Ambulatory Visit (INDEPENDENT_AMBULATORY_CARE_PROVIDER_SITE_OTHER): Payer: Medicare Other | Admitting: Family Medicine

## 2020-07-08 VITALS — BP 130/80 | HR 66 | Temp 98.1°F | Resp 18 | Ht 72.0 in | Wt 180.2 lb

## 2020-07-08 DIAGNOSIS — I480 Paroxysmal atrial fibrillation: Secondary | ICD-10-CM

## 2020-07-08 DIAGNOSIS — I10 Essential (primary) hypertension: Secondary | ICD-10-CM

## 2020-07-08 DIAGNOSIS — G20 Parkinson's disease: Secondary | ICD-10-CM | POA: Diagnosis not present

## 2020-07-08 DIAGNOSIS — E785 Hyperlipidemia, unspecified: Secondary | ICD-10-CM

## 2020-07-13 ENCOUNTER — Other Ambulatory Visit: Payer: Self-pay | Admitting: Family Medicine

## 2020-07-24 ENCOUNTER — Other Ambulatory Visit: Payer: Self-pay | Admitting: Family Medicine

## 2020-08-09 DIAGNOSIS — L820 Inflamed seborrheic keratosis: Secondary | ICD-10-CM | POA: Diagnosis not present

## 2020-08-09 DIAGNOSIS — L570 Actinic keratosis: Secondary | ICD-10-CM | POA: Diagnosis not present

## 2020-09-28 ENCOUNTER — Telehealth: Payer: Self-pay | Admitting: Neurology

## 2020-09-28 MED ORDER — CARBIDOPA-LEVODOPA 25-100 MG PO TABS
2.0000 | ORAL_TABLET | Freq: Three times a day (TID) | ORAL | 0 refills | Status: DC
Start: 2020-09-28 — End: 2021-01-03

## 2020-09-28 NOTE — Telephone Encounter (Signed)
Rx(s) sent to pharmacy electronically.  

## 2020-09-29 ENCOUNTER — Telehealth: Payer: Self-pay | Admitting: Neurology

## 2020-09-29 NOTE — Telephone Encounter (Signed)
Make sure pt aware of proper dosing

## 2020-09-29 NOTE — Telephone Encounter (Signed)
Received Lexapro refill request from [facility].   Contacted [facility] and informed pharmacist that rx has been increased to 10mg  qd. She states the patient picked up a 5mg  table on 07/24/2020 and also picked up a 10mg  rx on 07/15/20.   She states a refill is not due and maybe the patient is unaware that the medication has been increased.   Informed pharmacist that we are denying the refill for 5mg  med request. Pharmacist stated she would put a note in the chart that the patient is to only take 10mg  once a day.

## 2020-09-30 NOTE — Telephone Encounter (Signed)
Left message for patient to contact office.

## 2020-10-03 ENCOUNTER — Telehealth: Payer: Self-pay | Admitting: Neurology

## 2020-10-03 ENCOUNTER — Telehealth: Payer: Self-pay | Admitting: Family Medicine

## 2020-10-03 NOTE — Telephone Encounter (Signed)
Left message for patient to call back and schedule Medicare Annual Wellness Visit (AWV) either virtually OR in office.   Last AWV 01/23/18; please schedule at anytime with [facility]-Nurse Health Advisor at [facility].  This should be a 45 minute visit.

## 2020-10-03 NOTE — Telephone Encounter (Signed)
Left detailed message with results, ok per DPR, and to call back if any questions.  

## 2020-10-04 NOTE — Telephone Encounter (Signed)
Spoke with patients wife who states she is only giving the patient 10mg  of Lexapro once a day.   Wife states she will go to the pharmacy and make sure that they update the patients medication list .

## 2020-10-04 NOTE — Progress Notes (Signed)
Assessment/Plan:   1.  Parkinsons Disease  -Continue carbidopa/levodopa 25/100, 2 tablets 3 times per day.  Can take extra 1/2 tablet prn  -Continue carbidopa/levodopa 50/200 at bedtime  -refer to PT ([facility], [place])  -discussed restarting rsb, even if at a different location (where he had some friends who died)  2.  PDD with ? Hallucinations  -On donepezil, 10 mg daily.  -Think mood may be playing a role as well.  -consider nuplazid if worsen  3.  GAD/depression  -On Lexapro, 10 mg daily.  4.  A-fib  -Follows with cardiology.  On Eliquis.  Subjective:   Carlos [name] was seen today in follow up for Parkinsons disease.  Patient with wife who supplements history.  Daughter on the phone listening to visit.  My previous records were reviewed prior to todays visit as well as outside records available to me. Able to get up and down out of the chair without trouble with increased dose of levodopa.  Pt denies falls.  Pt denies lightheadedness, near syncope.  Unsure if has hallucinations - may ask wife at night after asleep if she saw dog or daughter.  Pt sits with eyes closed and wife wonders if he is sleeping but she isn't sure and he will see "dogs, cats, children" but unsure if arising out of sleep or wakefulness.  No agitation and it doesn't disturb him.  Mood has been fair with increased dose of Lexapro last visit.  Pt denies depression but wife states that he doesn't speak as much as in the past.  Has had some deaths in family (wifes sister, whom he was close with). He is doing biking online and a virtual dance class.  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  Current prescribed movement disorder medications: Carbidopa/levodopa 25/100, 2 tablets 3 times per day (increased last visit) Carbidopa/levodopa 50/200 at bed Donepezil Lexapro, 10 mg   ALLERGIES:  No Known Allergies  CURRENT  MEDICATIONS:  Outpatient Encounter Medications as of 10/05/2020  Medication Sig  . apixaban (ELIQUIS) 5 MG TABS tablet Take 1 tablet (5 mg total) by mouth 2 (two) times daily.  Marland Kitchen atenolol (TENORMIN) 25 MG tablet TAKE 1 TABLET BY MOUTH DAILY  . carbidopa-levodopa (SINEMET CR) 50-200 MG tablet Take 1 tablet by mouth at bedtime.  . carbidopa-levodopa (SINEMET IR) 25-100 MG tablet Take 2 tablets by mouth 3 (three) times daily.  Marland Kitchen donepezil (ARICEPT) 10 MG tablet Take 1 tablet (10 mg total) by mouth at bedtime.  Marland Kitchen escitalopram (LEXAPRO) 10 MG tablet Take 1 tablet (10 mg total) by mouth daily.  Marland Kitchen POLY-IRON 150 150 MG capsule TAKE 1 CAPSULE(150 MG) BY MOUTH DAILY (Patient taking differently: Take by mouth every other day.)  . simvastatin (ZOCOR) 10 MG tablet Take 1 tablet (10 mg total) by mouth at bedtime.   No facility-administered encounter medications on file as of 10/05/2020.    Objective:   PHYSICAL EXAMINATION:    VITALS:   Vitals:   10/05/20 1108  BP: 114/78  Pulse: 82  SpO2: 99%  Weight: 182 lb (82.6 kg)  Height: 6' (1.829 m)    GEN:  The patient appears stated age and is in NAD. HEENT:  Normocephalic, atraumatic.  The mucous membranes are moist. The superficial temporal arteries are without ropiness or tenderness. CV:  RRR Lungs:  CTAB Neck/HEME:  There are no carotid bruits bilaterally.  Neurological examination:  Orientation: The patient is alert and oriented x3.  Looks to wife for finer aspects of hx Cranial nerves: There is good facial symmetry with facial hypomimia. The speech is fluent and clear. Soft palate rises symmetrically and there is no tongue deviation. Hearing is intact to conversational tone. Sensation: Sensation is intact to light touch throughout Motor: Strength is at least antigravity x4.  Movement examination:  Tone: There is mild to mod incresaed tone in the RUE (he is due for medication) Abnormal movements: there is near constant RUE rest  tremor Coordination:  There is mild decremation with RAM's Gait and Station: The patient has no difficulty arising out of a deep-seated chair without the use of the hands. The patient's stride length is good (but get the feeling he is "showing off").    I have reviewed and interpreted the following labs independently    Chemistry      Component Value Date/Time   NA 137 05/13/2020 0000   K 3.7 05/13/2020 0000   CL 104 05/13/2020 0000   CO2 34 (A) 05/13/2020 0000   BUN 22 (A) 05/13/2020 0000   CREATININE 1.1 05/13/2020 0000   CREATININE 1.02 04/05/2020 1212   GLU 93 05/13/2020 0000      Component Value Date/Time   CALCIUM 9.7 04/05/2020 1212   ALKPHOS 61 12/30/2019 1156   AST 21 05/13/2020 0000   ALT 12 05/13/2020 0000   BILITOT 2.0 (H) 04/05/2020 1212       Lab Results  Component Value Date   WBC 5.4 05/13/2020   HGB 16.5 05/13/2020   HCT 50 05/13/2020   MCV 89.8 04/05/2020   PLT 122 (A) 05/13/2020    Lab Results  Component Value Date   TSH 1.72 05/13/2020     Total time spent on today's visit was 40 minutes, including both face-to-face time and nonface-to-face time.  Time included that spent on review of records (prior notes available to me/labs/imaging if pertinent), discussing treatment and goals, answering patient's questions and coordinating care.  Cc:  [name], MD

## 2020-10-05 ENCOUNTER — Ambulatory Visit: Payer: Medicare Other | Admitting: Neurology

## 2020-10-05 ENCOUNTER — Encounter: Payer: Self-pay | Admitting: Neurology

## 2020-10-05 ENCOUNTER — Other Ambulatory Visit: Payer: Self-pay

## 2020-10-05 VITALS — BP 114/78 | HR 82 | Ht 72.0 in | Wt 182.0 lb

## 2020-10-05 DIAGNOSIS — F330 Major depressive disorder, recurrent, mild: Secondary | ICD-10-CM | POA: Diagnosis not present

## 2020-10-05 DIAGNOSIS — F0280 Dementia in other diseases classified elsewhere without behavioral disturbance: Secondary | ICD-10-CM | POA: Diagnosis not present

## 2020-10-05 DIAGNOSIS — G20 Parkinson's disease: Secondary | ICD-10-CM | POA: Diagnosis not present

## 2020-10-05 NOTE — Patient Instructions (Signed)
1.  Take carbidopa/levodopa 25/100, 2 tablets 3 times per day.  Can take extra 1/2 tablet prn 2.  Continue carbidopa/levodopa 50/200 at bedtime 3.  We will send a referral for physical therapy 4.  Please look into re-starting rock steady boxing  The physicians and staff at [facility] are committed to providing excellent care. You may receive a survey requesting feedback about your experience at our office. We strive to receive "very good" responses to the survey questions. If you feel that your experience would prevent you from giving the office a "very good " response, please contact our office to try to remedy the situation. We may be reached at [phone]. Thank you for taking the time out of your busy day to complete the survey.

## 2020-10-18 ENCOUNTER — Encounter: Payer: Self-pay | Admitting: Family Medicine

## 2020-10-18 ENCOUNTER — Telehealth (INDEPENDENT_AMBULATORY_CARE_PROVIDER_SITE_OTHER): Payer: Medicare Other | Admitting: Family Medicine

## 2020-10-18 ENCOUNTER — Other Ambulatory Visit: Payer: Self-pay

## 2020-10-18 ENCOUNTER — Telehealth: Payer: Self-pay

## 2020-10-18 VITALS — BP 103/66 | HR 64 | Temp 96.6°F | Ht 72.0 in | Wt 182.0 lb

## 2020-10-18 DIAGNOSIS — R410 Disorientation, unspecified: Secondary | ICD-10-CM

## 2020-10-18 DIAGNOSIS — R5383 Other fatigue: Secondary | ICD-10-CM

## 2020-10-18 MED ORDER — CEPHALEXIN 500 MG PO CAPS: 500.0000 mg | ORAL_CAPSULE | Freq: Three times a day (TID) | ORAL | 0 refills | Status: AC

## 2020-10-18 NOTE — Progress Notes (Signed)
Phone [phone] Virtual visit via phonenote   Subjective:   Chief Complaint  Patient presents with  . Fatigue   This visit type was conducted due to national recommendations for restrictions regarding the COVID-19 Pandemic (e.g. social distancing).  This format is felt to be most appropriate for this patient at this time balancing risks to patient and risks to population by having him in for in person visit.  All issues noted in this document were discussed and addressed.  No physical exam was performed (except for noted visual exam or audio findings with Telehealth visits).  The patient has consented to conduct a Telehealth visit and understands insurance will be billed.   Our team/I connected with Joyice Faster Hume at  4:40 PM EST by phone (patient did not have equipment for webex) and verified that I am speaking with the correct person using two identifiers.  Location patient: [facility] Location provider: [facility], office Persons participating in the virtual visit:  Patient, wife  Time on phone: 23 minutes Counseling provided about covid 16  Our team/I discussed the limitations of evaluation and management by telemedicine and the availability of in person appointments. In light of current covid-19 pandemic, patient also understands that we are trying to protect them by minimizing in office contact if at all possible.  The patient expressed consent for telemedicine visit and agreed to proceed. Patient understands insurance will be billed.   Past Medical History-  Patient Active Problem List   Diagnosis Date Noted  . Parkinson's disease ([place]) 03/09/2016    Priority: High  . PAF (paroxysmal atrial fibrillation) ([place]) 02/03/2010    Priority: High  . Aortic insufficiency 07/20/2015    Priority: Medium  . Chest pain     Priority: Medium  . Essential hypertension     Priority: Medium  . Mitral regurgitation     Priority: Medium  . Diastolic dysfunction     Priority: Medium  .  Dyslipidemia     Priority: Medium  . Primary osteoarthritis of right hip 06/19/2018    Priority: Low  . Allergy 12/07/2016    Priority: Low  . Sinus node dysfunction ([facility]) 08/02/2015    Priority: Low  . Hx of colonic polyps 01/13/2013    Priority: Low  . Incomplete RBBB     Priority: Low  . LVH (left ventricular hypertrophy)     Priority: Low  . Sinus bradycardia     Priority: Low  . PVC (premature ventricular contraction)     Priority: Low  . Allergic rhinitis 12/28/2009    Priority: Low  . History of basal cell carcinoma of skin 10/26/2008    Priority: Low  . Anxiety state 10/21/2007    Priority: Low  . Osteoarthritis 10/21/2007    Priority: Low  . RENAL CALCULUS, HX OF 10/20/2007    Priority: Low  . Acute GI bleeding 12/22/2019  . GI bleeding 12/22/2019  . Rectal bleeding   . Thrombocytopenia ([place]) 09/29/2019  . Combined forms of age-related cataract 12/17/2012  . Presbyopia 12/17/2012  . Ptosis of eyelid 12/17/2012  . Vitreous syneresis 12/17/2012    Medications- reviewed and updated Current Outpatient Medications  Medication Sig Dispense Refill  . apixaban (ELIQUIS) 5 MG TABS tablet Take 1 tablet (5 mg total) by mouth 2 (two) times daily. 180 tablet 3  . atenolol (TENORMIN) 25 MG tablet TAKE 1 TABLET BY MOUTH DAILY 90 tablet 3  . carbidopa-levodopa (SINEMET CR) 50-200 MG tablet Take 1 tablet by mouth at bedtime.  90 tablet 1  . carbidopa-levodopa (SINEMET IR) 25-100 MG tablet Take 2 tablets by mouth 3 (three) times daily. 540 tablet 0  . cephALEXin (KEFLEX) 500 MG capsule Take 1 capsule (500 mg total) by mouth 3 (three) times daily for 7 days. 21 capsule 0  . donepezil (ARICEPT) 10 MG tablet Take 1 tablet (10 mg total) by mouth at bedtime. 90 tablet 3  . escitalopram (LEXAPRO) 10 MG tablet Take 1 tablet (10 mg total) by mouth daily. 90 tablet 1  . POLY-IRON 150 150 MG capsule TAKE 1 CAPSULE(150 MG) BY MOUTH DAILY (Patient taking differently: Take by mouth every  other day.) 30 capsule 3  . simvastatin (ZOCOR) 10 MG tablet Take 1 tablet (10 mg total) by mouth at bedtime. 90 tablet 3   No current facility-administered medications for this visit.     Objective:  BP 103/66   Pulse 64   Temp (!) 96.6 F (35.9 C) (Oral)   Ht 6' (1.829 m)   Wt 182 lb (82.6 kg)   SpO2 94%   BMI 24.68 kg/m  self reported vitals  Nonlabored voice, normal speech      Assessment and Plan   # Fatigue S: niece that drove patient's wife to appointment last week ended up testing positive for covid. Yesterday wife tested positive and he tested negative- has tested negative on rapid 3 days in a row. [facility] or [facility]   Patient is lethargic and more confused since Sunday. Today all he wants to do is sleep. Lower appetite. Some hallucinations  Did a home UTI test and positive for nitrites. He has not been drinking much and peeing less. Today wife insisted he drink-  A/P:  80 year old male with positive covid 19 contact presenting with fatigue, confusion above baseline with Parkinsons, hallucinations more than normal (has some with parkinsons at baseline) concerning for delirium with potential cause of covid 19 (has tested negative on rapids but we are going to get a PCR test) vs. UTI (tested positive on home test for nitrites).  - patient lives about an hour away so difficult to get in for urine sample- and with wife with covid as well makes more challenging- with this barrier and fact PCR test may take a few days to come back opted for empiric UTI treatment.   Patient with symptoms concerning for potential covid 19 Therefore: - testing options discussed with patient- [facility] local [facility] - recommended patient watch closely for shortness of breath or worsening confusion or worsening symptoms and if those occur he should contact us immediately  -recommended patient consider using pulse oximeter and if levels 93% or below persistently- seek care  at the hospital  -recommended self quarantine until negative test  at minimum - only in home with wife who is positive so quarantine dose not apply.  - for isolation if covid 19 test positive would need to be at least 10 days since first symptom AND at least 24 hours fever free without fever reducing medications AND improvement in respiratory symptoms (discussed newer guidelines state 5 days btu I prefer 10 days) - we also discussed close contacts would need to be tested -keep pushing fluids- dehydration could contribute to delirium  Recommended follow up: as needed for acute concerns Future Appointments  Date Time Provider [place]  12/16/2020 10:30 AM Evans Lance, MD [facility]  01/10/2021 10:40 AM [name], MD [facility]  04/04/2021 11:15  AM Tat, Eustace Quail, DO [facility] None    Lab/Order associations:   ICD-10-CM   1. Fatigue, unspecified type  R53.83   2. Delirium  R41.0     Meds ordered this encounter  Medications  . cephALEXin (KEFLEX) 500 MG capsule    Sig: Take 1 capsule (500 mg total) by mouth 3 (three) times daily for 7 days.    Dispense:  21 capsule    Refill:  0    Return precautions advised.  Garret Reddish, MD

## 2020-10-18 NOTE — Patient Instructions (Addendum)
  Depression screen [facility] 2/9 04/05/2020 09/29/2019 02/11/2019  Decreased Interest 0 0 0  Down, Depressed, Hopeless 0 0 0  PHQ - 2 Score 0 0 0  Altered sleeping 0 0 0  Tired, decreased energy 0 0 0  Change in appetite 0 0 0  Feeling bad or failure about yourself  0 0 0  Trouble concentrating 0 0 0  Moving slowly or fidgety/restless 0 0 1  Suicidal thoughts 0 0 0  PHQ-9 Score 0 0 1  Difficult doing work/chores Not difficult at all Not difficult at all Somewhat difficult    Recommended follow up: No follow-ups on file.

## 2020-10-18 NOTE — Telephone Encounter (Signed)
Please schedule virtual for pt to discuss with Dr. Hunter. °

## 2020-10-18 NOTE — Telephone Encounter (Signed)
Pt wife called stating she believes pt has a UTI. He has been confused and lethargic. Wife states she did an at home test and it came back positive. Wife currently has COVID and the husband is quarantining. Wife is unable to bring a specimen in or come to office due to [place]. Wife asked if Dr. [name] could call in an antibiotic or have someone give her a call to discuss her husbands symptoms. Please advise.

## 2020-10-18 NOTE — Telephone Encounter (Signed)
Noted  

## 2020-10-18 NOTE — Telephone Encounter (Signed)
Im willing to do 4 40 today- though I may be running behind

## 2020-10-18 NOTE — Telephone Encounter (Signed)
Scheduled pt at [phone] this evening for a virtual appt.

## 2020-10-19 DIAGNOSIS — Z20822 Contact with and (suspected) exposure to covid-19: Secondary | ICD-10-CM | POA: Diagnosis not present

## 2020-10-24 ENCOUNTER — Other Ambulatory Visit: Payer: Self-pay | Admitting: Neurology

## 2020-10-24 ENCOUNTER — Telehealth: Payer: Self-pay | Admitting: Neurology

## 2020-10-24 ENCOUNTER — Telehealth: Payer: Self-pay

## 2020-10-24 MED ORDER — ESCITALOPRAM OXALATE 10 MG PO TABS
10.0000 mg | ORAL_TABLET | Freq: Every day | ORAL | 1 refills | Status: DC
Start: 2020-10-24 — End: 2021-04-04

## 2020-10-24 NOTE — Telephone Encounter (Signed)
FYI

## 2020-10-24 NOTE — Telephone Encounter (Signed)
Rx(s) sent to pharmacy electronically.  

## 2020-10-24 NOTE — Telephone Encounter (Signed)
Please schedule pt for virtual.  

## 2020-10-24 NOTE — Telephone Encounter (Signed)
Dr.Hunter requested for an update on the patient, wife declined appointment.

## 2020-10-24 NOTE — Telephone Encounter (Signed)
Called and lm on pt vm tcb. 

## 2020-10-24 NOTE — Telephone Encounter (Signed)
[name] called in saying he was tested for COVID on 2/16 and results came back on 2/18 that he was positive. No symptoms other than a normal runny nose.

## 2020-10-24 NOTE — Telephone Encounter (Signed)
Please go back over precautions that were given on initial visit-see if they have any questions. Also can offer ambulatory referral to COVID-19 treatment-we will need to use first day of symptoms bmet referral-this is for potential antibody infusion or medications including orals that are available-he may or may not be selected for these as there has been very high demand

## 2020-11-28 DIAGNOSIS — Z08 Encounter for follow-up examination after completed treatment for malignant neoplasm: Secondary | ICD-10-CM | POA: Diagnosis not present

## 2020-11-28 DIAGNOSIS — L821 Other seborrheic keratosis: Secondary | ICD-10-CM | POA: Diagnosis not present

## 2020-11-28 DIAGNOSIS — D1801 Hemangioma of skin and subcutaneous tissue: Secondary | ICD-10-CM | POA: Diagnosis not present

## 2020-11-28 DIAGNOSIS — Z85828 Personal history of other malignant neoplasm of skin: Secondary | ICD-10-CM | POA: Diagnosis not present

## 2020-11-28 DIAGNOSIS — L578 Other skin changes due to chronic exposure to nonionizing radiation: Secondary | ICD-10-CM | POA: Diagnosis not present

## 2020-12-16 ENCOUNTER — Telehealth: Payer: Self-pay | Admitting: Family Medicine

## 2020-12-16 ENCOUNTER — Ambulatory Visit: Payer: Medicare Other | Admitting: Internal Medicine

## 2020-12-16 NOTE — Chronic Care Management (AMB) (Signed)
  Chronic Care Management   Outreach Note  12/16/2020 Name: Carlos [name] MRN: [id number] DOB: [birth year]  Referred by: [name], MD Reason for referral : No chief complaint on file.   An unsuccessful telephone outreach was attempted today. The patient was referred to the pharmacist for assistance with care management and care coordination.   Follow Up Plan:   Lauretta Grill Upstream Scheduler

## 2020-12-19 NOTE — Progress Notes (Deleted)
Cardiology Office Note Date:  12/19/2020  Patient ID:  Carlos, [name] [birth year], MRN [id number] PCP:  [name], MD  Electrophysiologist: Dr. Lovena Le  ***refresh   Chief Complaint: *** annual visit  History of Present Illness: Carlos [name] is a 80 y.o. male with history of Parkinon's disease, AFib (permanent), HLD, HTN, RBBB  He comes in today to bes een for Dr. Lovena Le, last seen by him march 2021.  AT that time doing well, AFib had become permanent.  No changes were made.  *** symptoms *** bleeding, eliquis, labs, dose..... falls? syncope *** meds *** lipids    Past Medical History:  Diagnosis Date  . Allergic rhinitis   . Allergy   . Anxiety   . Aortic insufficiency    mild... echo... 02/2010  . Atrial flutter ([place]) 02/03/2010   Consult by Dr. Lovena Le... June, 2011.... plan rate control...  Coumadin not needed... ablation if rate not controlled well  . Basal cell carcinoma   . Chest pain    Nuclear, March, 2010, no ischemia  . Degenerative joint disease   . Diastolic dysfunction    EF 50-55%... echo.. 02/2010 (55-60% echo 2009)  . Dyslipidemia   . Ejection fraction    EF 50-55%, echo, June, 2011  . Hemorrhoids   . HEMORRHOIDS 10/21/2007   Qualifier: History of  By: [name], [name]   . HTN (hypertension)   . Incomplete RBBB    IRBBB; sinus bradycardia w/ PVCs  . LVH (left ventricular hypertrophy)    moderate... echo... 02/2010  . Mitral regurgitation    mild... echo.. 02/2010  . PVC (premature ventricular contraction)   . Renal calculus   . Sinus bradycardia   . Tubular adenoma of colon 08/2009    Past Surgical History:  Procedure Laterality Date  . COLONOSCOPY    . COLONOSCOPY WITH PROPOFOL N/A 12/22/2019   Procedure: COLONOSCOPY WITH PROPOFOL;  Surgeon: Jackquline Denmark, MD;  Location: [facility];  Service: Endoscopy;  Laterality: N/A;  . HEMOSTASIS CLIP PLACEMENT  12/22/2019   Procedure: HEMOSTASIS CLIP PLACEMENT;  Surgeon: Jackquline Denmark, MD;   Location: [facility];  Service: Endoscopy;;  . KIDNEY STONE SURGERY    . POLYPECTOMY      Current Outpatient Medications  Medication Sig Dispense Refill  . apixaban (ELIQUIS) 5 MG TABS tablet Take 1 tablet (5 mg total) by mouth 2 (two) times daily. 180 tablet 3  . atenolol (TENORMIN) 25 MG tablet TAKE 1 TABLET BY MOUTH DAILY 90 tablet 3  . carbidopa-levodopa (SINEMET CR) 50-200 MG tablet TAKE 1 TABLET BY MOUTH AT BEDTIME 90 tablet 1  . carbidopa-levodopa (SINEMET IR) 25-100 MG tablet Take 2 tablets by mouth 3 (three) times daily. 540 tablet 0  . donepezil (ARICEPT) 10 MG tablet Take 1 tablet (10 mg total) by mouth at bedtime. 90 tablet 3  . escitalopram (LEXAPRO) 10 MG tablet Take 1 tablet (10 mg total) by mouth daily. 90 tablet 1  . POLY-IRON 150 150 MG capsule TAKE 1 CAPSULE(150 MG) BY MOUTH DAILY (Patient taking differently: Take by mouth every other day.) 30 capsule 3  . simvastatin (ZOCOR) 10 MG tablet Take 1 tablet (10 mg total) by mouth at bedtime. 90 tablet 3   No current facility-administered medications for this visit.    Allergies:   Patient has no known allergies.   Social History:  The patient  reports that he has never smoked. He has never used smokeless tobacco. He reports previous alcohol  use. He reports that he does not use drugs.   Family History:  The patient's family history includes Colon cancer in his paternal aunt and paternal uncle; Healthy in his child; Hypertension in his mother; Other in his father; Stroke (age of onset: 12) in his mother.***  ROS:  Please see the history of present illness.    All other systems are reviewed and otherwise negative.   PHYSICAL EXAM:  VS:  There were no vitals taken for this visit. BMI: There is no height or weight on file to calculate BMI. Well nourished, well developed, in no acute distress HEENT: normocephalic, atraumatic Neck: no JVD, carotid bruits or masses Cardiac:  *** RRR; no significant murmurs, no rubs, or  gallops Lungs:  *** CTA b/l, no wheezing, rhonchi or rales Abd: soft, nontender MS: no deformity or *** atrophy Ext: *** no edema Skin: warm and dry, no rash Neuro:  No gross deficits appreciated, *** tremor Psych: euthymic mood, full affect    EKG:  Done today and reviewed by myself shows  ***  05/11/2015: TTE Study Conclusions  - Left ventricle: The cavity size was normal. Systolic function was  normal. The estimated ejection fraction was in the range of 60%  to 65%. Wall motion was normal; there were no regional wall  motion abnormalities.  - Aortic valve: Trileaflet; mildly thickened, mildly calcified  leaflets. There was moderate regurgitation.  - Aorta: Aortic root dimension: 41 mm (ED).  - Ascending aorta: The ascending aorta was mildly dilated.  - Mitral valve: Mild prolapse, involving the posterior leaflet.  There was mild regurgitation.  - Left atrium: The atrium was moderately dilated.  - Pulmonic valve: There was moderate regurgitation.   Impressions:  - When compared to prior, aortic regurgitation is now moderate  (previously mild).   Recent Labs: 05/13/2020: ALT 12; BUN 22; Creatinine 1.1; Hemoglobin 16.5; Platelets 122; Potassium 3.7; Sodium 137; TSH 1.72  04/05/2020: Total CHOL/HDL Ratio 1.9 05/13/2020: Cholesterol 103; HDL 58; LDL Cholesterol 35; Triglycerides 49   CrCl cannot be calculated (Patient's most recent lab result is older than the maximum 21 days allowed.).   Wt Readings from Last 3 Encounters:  10/18/20 182 lb (82.6 kg)  10/05/20 182 lb (82.6 kg)  07/08/20 180 lb 3.2 oz (81.7 kg)     Other studies reviewed: Additional studies/records reviewed today include: summarized above  ASSESSMENT AND PLAN:  1. Permanent AFib     CHA2DS2Vasc is 3, on Eliquis, appropriately dosed     ***  2. HTN     ***  3. HLD     ***  Disposition: F/u with ***  Current medicines are reviewed at length with the patient today.  The patient did not  have any concerns regarding medicines.  Venetia Night, PA-C 12/19/2020 5:47 PM     [place] [id number] [phone] (office)  [phone] (fax)

## 2020-12-21 ENCOUNTER — Ambulatory Visit: Payer: Medicare Other | Admitting: Physician Assistant

## 2020-12-23 ENCOUNTER — Other Ambulatory Visit: Payer: Self-pay

## 2020-12-23 ENCOUNTER — Ambulatory Visit: Payer: Medicare Other | Admitting: Physician Assistant

## 2020-12-23 ENCOUNTER — Encounter: Payer: Self-pay | Admitting: Physician Assistant

## 2020-12-23 VITALS — BP 130/68 | HR 65 | Ht 72.0 in | Wt 183.8 lb

## 2020-12-23 DIAGNOSIS — E785 Hyperlipidemia, unspecified: Secondary | ICD-10-CM

## 2020-12-23 DIAGNOSIS — I10 Essential (primary) hypertension: Secondary | ICD-10-CM

## 2020-12-23 DIAGNOSIS — I4821 Permanent atrial fibrillation: Secondary | ICD-10-CM | POA: Diagnosis not present

## 2020-12-23 NOTE — Patient Instructions (Addendum)

## 2020-12-23 NOTE — Progress Notes (Signed)
Cardiology Office Note Date:  12/23/2020  Patient ID:  Carlos [name], Carlos [name] 12/24/1940, MRN [id number] PCP:  [name], MD  Electrophysiologist: Dr. Lovena Le    Chief Complaint: annual visit  History of Present Illness: Carlos [name] is a 80 y.o. male with history of Parkinon's disease, AFib (permanent), HLD, HTN, RBBB  He comes in today to bes een for Dr. Lovena Le, last seen by him march 2021.  At that time doing well, AFib had become permanent.  No changes were made.  TODAY He is accompanied by his wife Doing well, denies any falls, near falls. No CP,palpitations or cardiac awareness of any kind No SOB, DOE No dizzy spells, near syncope or syncope.  No bleeding or signs of bleeding   He sees Dr. [name] and the [facility] twice a year and has labs with both  Past Medical History:  Diagnosis Date  . Allergic rhinitis   . Allergy   . Anxiety   . Aortic insufficiency    mild... echo... 02/2010  . Atrial flutter ([place]) 02/03/2010   Consult by Dr. Lovena Le... June, 2011.... plan rate control...  Coumadin not needed... ablation if rate not controlled well  . Basal cell carcinoma   . Chest pain    Nuclear, March, 2010, no ischemia  . Degenerative joint disease   . Diastolic dysfunction    EF 50-55%... echo.. 02/2010 (55-60% echo 2009)  . Dyslipidemia   . Ejection fraction    EF 50-55%, echo, June, 2011  . Hemorrhoids   . HEMORRHOIDS 10/21/2007   Qualifier: History of  By: [name], [name]   . HTN (hypertension)   . Incomplete RBBB    IRBBB; sinus bradycardia w/ PVCs  . LVH (left ventricular hypertrophy)    moderate... echo... 02/2010  . Mitral regurgitation    mild... echo.. 02/2010  . PVC (premature ventricular contraction)   . Renal calculus   . Sinus bradycardia   . Tubular adenoma of colon 08/2009    Past Surgical History:  Procedure Laterality Date  . COLONOSCOPY    . COLONOSCOPY WITH PROPOFOL N/A 12/22/2019   Procedure: COLONOSCOPY WITH PROPOFOL;  Surgeon:  Jackquline Denmark, MD;  Location: [facility];  Service: Endoscopy;  Laterality: N/A;  . HEMOSTASIS CLIP PLACEMENT  12/22/2019   Procedure: HEMOSTASIS CLIP PLACEMENT;  Surgeon: Jackquline Denmark, MD;  Location: [facility];  Service: Endoscopy;;  . KIDNEY STONE SURGERY    . POLYPECTOMY      Current Outpatient Medications  Medication Sig Dispense Refill  . apixaban (ELIQUIS) 5 MG TABS tablet Take 1 tablet (5 mg total) by mouth 2 (two) times daily. 180 tablet 3  . atenolol (TENORMIN) 25 MG tablet TAKE 1 TABLET BY MOUTH DAILY 90 tablet 3  . carbidopa-levodopa (SINEMET CR) 50-200 MG tablet TAKE 1 TABLET BY MOUTH AT BEDTIME 90 tablet 1  . carbidopa-levodopa (SINEMET IR) 25-100 MG tablet Take 2 tablets by mouth 3 (three) times daily. 540 tablet 0  . donepezil (ARICEPT) 10 MG tablet Take 1 tablet (10 mg total) by mouth at bedtime. 90 tablet 3  . escitalopram (LEXAPRO) 10 MG tablet Take 1 tablet (10 mg total) by mouth daily. 90 tablet 1  . iron polysaccharides (NIFEREX) 150 MG capsule Take 150 mg by mouth every other day.    . simvastatin (ZOCOR) 10 MG tablet Take 1 tablet (10 mg total) by mouth at bedtime. 90 tablet 3   No current facility-administered medications for this visit.  Allergies:   Patient has no known allergies.   Social History:  The patient  reports that he has never smoked. He has never used smokeless tobacco. He reports previous alcohol use. He reports that he does not use drugs.   Family History:  The patient's family history includes Colon cancer in his paternal aunt and paternal uncle; Healthy in his child; Hypertension in his mother; Other in his father; Stroke (age of onset: 70) in his mother.  ROS:  Please see the history of present illness.    All other systems are reviewed and otherwise negative.   PHYSICAL EXAM:  VS:  BP 130/68   Pulse 65   Ht 6' (1.829 m)   Wt 183 lb 12.8 oz (83.4 kg)   SpO2 95%   BMI 24.93 kg/m  BMI: Body mass index is 24.93 kg/m. Well  nourished, well developed, in no acute distress HEENT: normocephalic, atraumatic Neck: no JVD, carotid bruits or masses Cardiac:  irreg-irreg; no significant murmurs, no rubs, or gallops Lungs:  CTA b/l, no wheezing, rhonchi or rales Abd: soft, nontender MS: no deformity, age appropriate atrophy Ext: no edema Skin: warm and dry, no rash Neuro:  No gross deficits appreciated, resting tremor Psych: euthymic mood, full affect    EKG:  Done today and reviewed by myself shows  AFib 65bpm, no changes  05/11/2015: TTE Study Conclusions  - Left ventricle: The cavity size was normal. Systolic function was  normal. The estimated ejection fraction was in the range of 60%  to 65%. Wall motion was normal; there were no regional wall  motion abnormalities.  - Aortic valve: Trileaflet; mildly thickened, mildly calcified  leaflets. There was moderate regurgitation.  - Aorta: Aortic root dimension: 41 mm (ED).  - Ascending aorta: The ascending aorta was mildly dilated.  - Mitral valve: Mild prolapse, involving the posterior leaflet.  There was mild regurgitation.  - Left atrium: The atrium was moderately dilated.  - Pulmonic valve: There was moderate regurgitation.   Impressions:  - When compared to prior, aortic regurgitation is now moderate  (previously mild).   Recent Labs: 05/13/2020: ALT 12; BUN 22; Creatinine 1.1; Hemoglobin 16.5; Platelets 122; Potassium 3.7; Sodium 137; TSH 1.72  04/05/2020: Total CHOL/HDL Ratio 1.9 05/13/2020: Cholesterol 103; HDL 58; LDL Cholesterol 35; Triglycerides 49   CrCl cannot be calculated (Patient's most recent lab result is older than the maximum 21 days allowed.).   Wt Readings from Last 3 Encounters:  12/23/20 183 lb 12.8 oz (83.4 kg)  10/18/20 182 lb (82.6 kg)  10/05/20 182 lb (82.6 kg)     Other studies reviewed: Additional studies/records reviewed today include: summarized above  ASSESSMENT AND PLAN:  1. Permanent AFib      CHA2DS2Vasc is 3, on Eliquis, appropriately dosed     Asymptomatic, rate controlled  2. HTN     Looks good, no changes  3. HLD     Monitored and managed with Dr. [name]  Disposition: F/u with EP in a year, sooner if needed  Current medicines are reviewed at length with the patient today.  The patient did not have any concerns regarding medicines.  Venetia Night, PA-C 12/23/2020 11:21 AM     [address] [place] [id number] [phone] (office)  [phone] (fax)

## 2020-12-29 ENCOUNTER — Other Ambulatory Visit: Payer: Self-pay

## 2020-12-29 MED ORDER — DONEPEZIL HCL 10 MG PO TABS
10.0000 mg | ORAL_TABLET | Freq: Every day | ORAL | 0 refills | Status: DC
Start: 2020-12-29 — End: 2021-03-14

## 2021-01-03 ENCOUNTER — Other Ambulatory Visit: Payer: Self-pay | Admitting: Neurology

## 2021-01-04 ENCOUNTER — Other Ambulatory Visit: Payer: Self-pay | Admitting: Neurology

## 2021-01-06 ENCOUNTER — Other Ambulatory Visit: Payer: Self-pay

## 2021-01-06 MED ORDER — CARBIDOPA-LEVODOPA 25-100 MG PO TABS
2.0000 | ORAL_TABLET | Freq: Three times a day (TID) | ORAL | 0 refills | Status: DC
Start: 2021-01-06 — End: 2021-03-14

## 2021-01-10 ENCOUNTER — Ambulatory Visit (INDEPENDENT_AMBULATORY_CARE_PROVIDER_SITE_OTHER): Payer: Medicare Other | Admitting: Family Medicine

## 2021-01-10 ENCOUNTER — Other Ambulatory Visit: Payer: Self-pay

## 2021-01-10 ENCOUNTER — Encounter: Payer: Self-pay | Admitting: Family Medicine

## 2021-01-10 VITALS — BP 113/64 | HR 60 | Temp 98.2°F | Ht 72.0 in | Wt 182.8 lb

## 2021-01-10 DIAGNOSIS — I10 Essential (primary) hypertension: Secondary | ICD-10-CM

## 2021-01-10 DIAGNOSIS — E785 Hyperlipidemia, unspecified: Secondary | ICD-10-CM | POA: Diagnosis not present

## 2021-01-10 DIAGNOSIS — Z0000 Encounter for general adult medical examination without abnormal findings: Secondary | ICD-10-CM | POA: Diagnosis not present

## 2021-01-10 DIAGNOSIS — D649 Anemia, unspecified: Secondary | ICD-10-CM

## 2021-01-10 DIAGNOSIS — D696 Thrombocytopenia, unspecified: Secondary | ICD-10-CM | POA: Diagnosis not present

## 2021-01-10 DIAGNOSIS — I480 Paroxysmal atrial fibrillation: Secondary | ICD-10-CM

## 2021-01-10 DIAGNOSIS — R739 Hyperglycemia, unspecified: Secondary | ICD-10-CM

## 2021-01-10 LAB — CBC WITH DIFFERENTIAL/PLATELET
Basophils Absolute: 0.1 10*3/uL (ref 0.0–0.1)
Basophils Relative: 1.1 % (ref 0.0–3.0)
Eosinophils Absolute: 0.1 10*3/uL (ref 0.0–0.7)
Eosinophils Relative: 1.5 % (ref 0.0–5.0)
HCT: 48.9 % (ref 39.0–52.0)
Hemoglobin: 16.6 g/dL (ref 13.0–17.0)
Lymphocytes Relative: 22.1 % (ref 12.0–46.0)
Lymphs Abs: 1.4 10*3/uL (ref 0.7–4.0)
MCHC: 34 g/dL (ref 30.0–36.0)
MCV: 91.5 fl (ref 78.0–100.0)
Monocytes Absolute: 0.5 10*3/uL (ref 0.1–1.0)
Monocytes Relative: 8.2 % (ref 3.0–12.0)
Neutro Abs: 4.1 10*3/uL (ref 1.4–7.7)
Neutrophils Relative %: 67.1 % (ref 43.0–77.0)
Platelets: 115 10*3/uL — ABNORMAL LOW (ref 150.0–400.0)
RBC: 5.35 Mil/uL (ref 4.22–5.81)
RDW: 13.9 % (ref 11.5–15.5)
WBC: 6.1 10*3/uL (ref 4.0–10.5)

## 2021-01-10 LAB — COMPREHENSIVE METABOLIC PANEL
ALT: 9 U/L (ref 0–53)
AST: 19 U/L (ref 0–37)
Albumin: 4.4 g/dL (ref 3.5–5.2)
Alkaline Phosphatase: 68 U/L (ref 39–117)
BUN: 19 mg/dL (ref 6–23)
CO2: 35 mEq/L — ABNORMAL HIGH (ref 19–32)
Calcium: 9.7 mg/dL (ref 8.4–10.5)
Chloride: 100 mEq/L (ref 96–112)
Creatinine, Ser: 1.06 mg/dL (ref 0.40–1.50)
GFR: 66.24 mL/min (ref >60.00)
Glucose, Bld: 70 mg/dL (ref 70–99)
Potassium: 3.7 mEq/L (ref 3.5–5.1)
Sodium: 140 mEq/L (ref 135–145)
Total Bilirubin: 2.1 mg/dL — ABNORMAL HIGH (ref 0.2–1.2)
Total Protein: 6.8 g/dL (ref 6.0–8.3)

## 2021-01-10 LAB — LIPID PANEL
Cholesterol: 105 mg/dL (ref 0–200)
HDL: 49.4 mg/dL (ref >39.00)
LDL Cholesterol: 40 mg/dL (ref 0–99)
NonHDL: 55.35
Total CHOL/HDL Ratio: 2
Triglycerides: 76 mg/dL (ref 0.0–149.0)
VLDL: 15.2 mg/dL (ref 0.0–40.0)

## 2021-01-10 LAB — IBC + FERRITIN
Ferritin: 73.9 ng/mL (ref 22.0–322.0)
Iron: 132 ug/dL (ref 42–165)
Saturation Ratios: 39.3 % (ref 20.0–50.0)
Transferrin: 240 mg/dL (ref 212.0–360.0)

## 2021-01-10 LAB — HEMOGLOBIN A1C: Hgb A1c MFr Bld: 5.6 % (ref 4.6–6.5)

## 2021-01-10 MED ORDER — SIMVASTATIN 10 MG PO TABS: 10.0000 mg | ORAL_TABLET | Freq: Every day | ORAL | 3 refills | Status: DC

## 2021-01-10 NOTE — Progress Notes (Signed)
Phone: [phone]   Subjective:  Patient presents today for their annual physical. Chief complaint-noted.   See problem oriented charting- ROS- full  review of systems was completed and negative  except for: palpitation related to atrial fibrillation, confusion, hallucinations, and decreased conentration-last 3 issues related to Parkinson's  The following were reviewed and entered/updated in epic: Past Medical History:  Diagnosis Date  . Allergic rhinitis   . Allergy   . Anxiety   . Aortic insufficiency    mild... echo... 02/2010  . Atrial flutter ([place]) 02/03/2010   Consult by Dr. Lovena Le... June, 2011.... plan rate control...  Coumadin not needed... ablation if rate not controlled well  . Basal cell carcinoma   . Chest pain    Nuclear, March, 2010, no ischemia  . Degenerative joint disease   . Diastolic dysfunction    EF 50-55%... echo.. 02/2010 (55-60% echo 2009)  . Dyslipidemia   . Ejection fraction    EF 50-55%, echo, June, 2011  . Hemorrhoids   . HEMORRHOIDS 10/21/2007   Qualifier: History of  By: [name], [name]   . HTN (hypertension)   . Incomplete RBBB    IRBBB; sinus bradycardia w/ PVCs  . LVH (left ventricular hypertrophy)    moderate... echo... 02/2010  . Mitral regurgitation    mild... echo.. 02/2010  . PVC (premature ventricular contraction)   . Renal calculus   . Sinus bradycardia   . Tubular adenoma of colon 08/2009   Patient Active Problem List   Diagnosis Date Noted  . Parkinson's disease ([place]) 03/09/2016    Priority: High  . PAF (paroxysmal atrial fibrillation) ([place]) 02/03/2010    Priority: High  . Aortic insufficiency 07/20/2015    Priority: Medium  . Chest pain     Priority: Medium  . Essential hypertension     Priority: Medium  . Mitral regurgitation     Priority: Medium  . Diastolic dysfunction     Priority: Medium  . Dyslipidemia     Priority: Medium  . Primary osteoarthritis of right hip 06/19/2018    Priority: Low  . Allergy  12/07/2016    Priority: Low  . Sinus node dysfunction ([facility]) 08/02/2015    Priority: Low  . Hx of colonic polyps 01/13/2013    Priority: Low  . Incomplete RBBB     Priority: Low  . LVH (left ventricular hypertrophy)     Priority: Low  . Sinus bradycardia     Priority: Low  . PVC (premature ventricular contraction)     Priority: Low  . Allergic rhinitis 12/28/2009    Priority: Low  . History of basal cell carcinoma of skin 10/26/2008    Priority: Low  . Anxiety state 10/21/2007    Priority: Low  . Osteoarthritis 10/21/2007    Priority: Low  . RENAL CALCULUS, HX OF 10/20/2007    Priority: Low  . Acute GI bleeding 12/22/2019  . GI bleeding 12/22/2019  . Rectal bleeding   . Thrombocytopenia ([place]) 09/29/2019  . Combined forms of age-related cataract 12/17/2012  . Presbyopia 12/17/2012  . Ptosis of eyelid 12/17/2012  . Vitreous syneresis 12/17/2012   Past Surgical History:  Procedure Laterality Date  . COLONOSCOPY    . COLONOSCOPY WITH PROPOFOL N/A 12/22/2019   Procedure: COLONOSCOPY WITH PROPOFOL;  Surgeon: Jackquline Denmark, MD;  [facility];  Service: Endoscopy;  Laterality: N/A;  . HEMOSTASIS CLIP PLACEMENT  12/22/2019   Procedure: HEMOSTASIS CLIP PLACEMENT;  Surgeon: Jackquline Denmark, MD;  Location:  [facility];  Service: Endoscopy;;  . KIDNEY STONE SURGERY    . POLYPECTOMY      Family History  Problem Relation Age of Onset  . Hypertension Mother   . Stroke Mother 6       lived to 86  . Other Father        parkinsons ? 84  . Healthy Child   . Colon cancer Paternal Aunt   . Colon cancer Paternal Uncle     Medications- reviewed and updated Current Outpatient Medications  Medication Sig Dispense Refill  . apixaban (ELIQUIS) 5 MG TABS tablet Take 1 tablet (5 mg total) by mouth 2 (two) times daily. 180 tablet 3  . atenolol (TENORMIN) 25 MG tablet TAKE 1 TABLET BY MOUTH DAILY 90 tablet 3  . carbidopa-levodopa (SINEMET CR) 50-200 MG tablet TAKE 1 TABLET BY  MOUTH AT BEDTIME 90 tablet 1  . carbidopa-levodopa (SINEMET IR) 25-100 MG tablet Take 2 tablets by mouth 3 (three) times daily. 540 tablet 0  . donepezil (ARICEPT) 10 MG tablet Take 1 tablet (10 mg total) by mouth at bedtime. 90 tablet 0  . escitalopram (LEXAPRO) 10 MG tablet Take 1 tablet (10 mg total) by mouth daily. 90 tablet 1  . iron polysaccharides (NIFEREX) 150 MG capsule Take 150 mg by mouth every other day.    . simvastatin (ZOCOR) 10 MG tablet Take 1 tablet (10 mg total) by mouth at bedtime. 90 tablet 3   No current facility-administered medications for this visit.    Allergies-reviewed and updated No Known Allergies  Social History   Social History Narrative   Married- wife patient of Dr. [name]. Son and daughter in 72s in 2018. 19 72 year old grandchild.  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 Active with parkinsons exercise class- boxing and cycling   Objective  Objective:  BP 113/64   Pulse 60   Temp 98.2 F (36.8 C) (Temporal)   Ht 6' (1.829 m)   Wt 182 lb 12.8 oz (82.9 kg)   SpO2 97%   BMI 24.79 kg/m  Gen: NAD, resting comfortably HEENT: Mucous membranes are moist. Oropharynx normal. Bilateral TM normal. Nasal turbinates normal Neck: no thyromegaly, no cervical lymphadenopathy CV: Irregularly Irregular, no murmurs rubs or gallops. +2 PT pulses  Lungs: CTAB no crackles, wheeze, rhonchi Abdomen: soft/nontender/nondistended/normal bowel sounds. No rebound or guarding. No splenomegaly  Ext: no edema Skin: warm, dry Neuro: grossly normal, moves all extremities, PERRLA. baseline resting tremor stable. Slow shuffling gait consistent with baseline     Assessment and Plan  80 y.o. male presenting for annual physical.  Health Maintenance counseling: 1. Anticipatory guidance: Patient counseled regarding regular dental exams every q6  months, recommends that he has an eye exam yearly or at least every other year-he is now due,  avoiding smoking and second hand smoke , limiting alcohol to 2 beverages per day.  No illicit drugs 2. Risk factor reduction:  Advised patient of need for regular exercise and diet rich and fruits and vegetables to reduce risk of heart attack and stroke. Exercise-He is staying active by mowing the grass, and has no issues completing this activiy. [facility] with progression of Parkinson's.. No fall or injuries noted. Diet-wife is monitoring his meals and eats healthy.  Wt  Readings from Last 3 Encounters:  01/10/21 182 lb 12.8 oz (82.9 kg)  12/23/20 183 lb 12.8 oz (83.4 kg)  10/18/20 182 lb (82.6 kg)  3. Immunizations/screenings/ancillary studies-discussed prevnar 20 plan on doing that at the next visit or at next physical-declines for now Immunization History  Administered Date(s) Administered  . Fluad Quad(high Dose 65+) 06/10/2020  . Influenza Split 06/24/2011, 06/03/2012, 06/08/2013, 07/12/2014  . Influenza Whole 10/23/2009, 06/28/2010  . Influenza, High Dose Seasonal PF 07/09/2016, 06/20/2017, 05/20/2018, 06/06/2019  . Influenza-Unspecified 07/06/2015  . PFIZER(Purple Top)SARS-COV-2 Vaccination 09/22/2019, 10/13/2019, 07/04/2020  . Pneumococcal Conjugate-13 07/19/2014  . Pneumococcal Polysaccharide-23 12/07/2016  . Tdap 09/03/2009, 01/02/2012  . Zoster Recombinat (Shingrix) 02/03/2018, 06/13/2018  4. Prostate cancer screening- Stable, past age based screning recs, prior psa trend low risk Lab Results  Component Value Date   PSA 2.560 12/13/2017   PSA 2.24 02/06/2016   PSA 2.10 01/17/2015   5. Colon cancer screening - history of post polypectomy bleeding- no further colonoscopy was advised 6. Skin cancer screening- sees derm yearly. Advised regular sunscreen use. Denies worrisome, changing, or new skin lesions.  7. Never  smoker 8. STD screening - monogomous  Status of chronic or acute concerns   #Social update Wife reports that they are considering to transition into an assistant living facility due to aging process and Parkinson's.  This will be in [place], [place] and they will likely transition primary care there-I think that is the appropriate move.  I would recommend staying with Dr. Carles Collet movement disorder specialist  # Fatigue related to UTI at last visit-:Last visit patient was more lethargic and confused than baseline. Also had a decrease in appetite with some hallucinations. His at home UTI test was positive for nitrites. He was given Cephalexin 500 mg and instructed to take it 3 times daily for 7 days.  Thankfully symptoms resolved-back to baseline dementia related to Parkinson's  # Depression S: Medication: Lexapro 10 mg- up from 5 mg from Dr. Carles Collet - patient had lost his brother at time of last visit. -[facility] 2/9 01/10/2021 04/05/2020 09/29/2019  Decreased Interest 0 0 0  Down, Depressed, Hopeless 0 0 0  PHQ - 2 Score 0 0 0  Altered sleeping 0 0 0  Tired, decreased energy 0 0 0  Change in appetite 0 0 0  Feeling bad or failure about yourself  0 0 0  Trouble concentrating 3 0 0  Moving slowly or fidgety/restless 0 0 0  Suicidal thoughts 0 0 0  PHQ-9 Score 3 0 0  Difficult doing work/chores Somewhat difficult Not difficult at all Not difficult at all  A/P: . Patient is doing well on Lexapro 10 mg/full remission-continue current medication  # Iron deficiency anima status post polyectomy S:Patient remain on iron mediation and continues take consistently  A/P:Ordering CBC today as well as ferritin level   % Parkinson's disease/anxiety-follows with Dr. Carles Collet of neurology S:  Still mows his own lawn/rather flat. 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   Compliant with Sinemet.  He is also compliant with Lexapro  10 mg from Dr. Carles Collet he is also on Aricept for memory changes A/P: Stable-continue close follow-up with Dr. Carles Collet  #% Atrial fibrillation-also follows with Dr. Lovena Le of cardiology S: Patient is anticoagulated with Eliquis.  Also on atenolol for rate control.  He is intermittently having palpitations still. A/P: Appropriately anticoagulated and rate controlled-continue  current medication  #hypertension Compliant with atenolol 25 mg. Prior on losartan hydrochlorothiazide 100-12.5 mg but only takes 1/2 tablet- stopped after GI bleed and BP did not substantially increase.   BP Readings from Last 3 Encounters:  01/10/21 113/64  12/23/20 130/68  10/18/20 103/66  A/P: Well-controlled-continue current medication  #Dyslipidemia S:  Compliant with simvastatin 40 mg--> reduced to 10mg  09/2019 due to LDL under 40.  LDL well controlled under 70   Lab Results  Component Value Date   CHOL 103 05/13/2020   HDL 58 05/13/2020   LDLCALC 35 05/13/2020   LDLDIRECT 37.0 12/17/2017   TRIG 49 05/13/2020   CHOLHDL 1.9 04/05/2020   A/P: We reduced statin strength at last visit-I am okay if level is closer to 70 today-likely continue current medication  # Hyperglycemia/insulin resistance/prediabetes- [facility] S:  Medication: None Exercise and diet- He is staying active by mowing the grass, and has no issues completing this activiy. [facility] because [facility] is [facility]. No fall or injuries noted. Wife is monitoring his meals and eats healthy.  Lab Results  Component Value Date   HGBA1C 5.8 05/13/2020   HGBA1C 5.6 12/13/2017  A/P: Hopefully stable or improved-update A1c with labs today   % Aortic insufficiency and mitral regurgitation- patient treated with dental prophylaxis in the past-no obvious issues today  # high baseline bilirubin-  possible gilbert's. Monitor LFTs today   Recommended follow [id number] in about 6 months (around 07/13/2021) for  follow up- or sooner if needed. Future Appointments  Date Time Provider [place]  04/04/2021 11:15 AM Tat, Eustace Quail, DO [facility] None  07/21/2021 11:20 AM [name] Brayton Mars, MD [facility]   Lab/Order associations: Not fasting   ICD-10-CM   1. Preventative health care  Z00.00   2. Essential hypertension  I10 CBC with Differential/Platelet    Comprehensive metabolic panel    Lipid panel  3. Dyslipidemia  E78.5 CBC with Differential/Platelet    Comprehensive metabolic panel    Lipid panel  4. PAF (paroxysmal atrial fibrillation) (H[facility])  I48.0   5. Hyperglycemia  R73.9 Hemoglobin A1c  6. Anemia, unspecified type  D64.9 IBC + Ferritin  7. Thrombocytopenia ([place]) Chronic D69.6     Meds ordered this encounter  Medications  . simvastatin (ZOCOR) 10 MG tablet    Sig: Take 1 tablet (10 mg total) by mouth at bedtime.    Dispense:  90 tablet    Refill:  3   I,Alexis Bryant,acting as a scribe for Garret Reddish, MD.,have documented all relevant documentation on the behalf of Garret Reddish, MD,as directed by  Garret Reddish, MD while in the presence of Garret Reddish, MD.  I, Garret Reddish, MD, have reviewed all documentation for this visit. The documentation on 01/10/21 for the exam, diagnosis, procedures, and orders are all accurate and complete.   Return precautions advised. Garret Reddish, MD

## 2021-01-10 NOTE — Patient Instructions (Addendum)
Please stop by lab before you go If you have mychart- we will send your results within 3 business days of [place] receiving them.  If you do not have mychart- we will call you about results within 5 business days of [place] receiving them.  *please also note that you will see labs on mychart as soon as they post. I will later go in and write notes on them- will say "notes from Dr. [name]"  No changes today unless labs lead [place] to make changes  Recommended follow up: Return in about 6 months (around 07/13/2021) for follow up- or sooner if needed.

## 2021-01-12 ENCOUNTER — Telehealth: Payer: Self-pay

## 2021-01-12 NOTE — Telephone Encounter (Signed)
Patient returned call regarding lab results please return call when avalaible

## 2021-01-12 NOTE — Telephone Encounter (Signed)
Called and lm for pt tcb. 

## 2021-01-13 NOTE — Telephone Encounter (Signed)
Spoke with patients wife in regards to his lab results.

## 2021-01-19 ENCOUNTER — Telehealth: Payer: Self-pay | Admitting: Neurology

## 2021-01-19 NOTE — Telephone Encounter (Signed)
Spoke pt wife and will call the PCP.

## 2021-01-19 NOTE — Telephone Encounter (Signed)
They will need to call PCP.  Joint/elbow pain is not associated with Parkinsons Disease

## 2021-02-13 ENCOUNTER — Telehealth: Payer: Self-pay | Admitting: Family Medicine

## 2021-02-13 NOTE — Progress Notes (Signed)
  Chronic Care Management   Outreach Note  02/13/2021 Name: Carlos [name] MRN: [id number] DOB: Oct 19, 1940  Referred by: [name], MD Reason for referral : No chief complaint on file.   A second unsuccessful telephone outreach was attempted today. The patient was referred to pharmacist for assistance with care management and care coordination.  Follow Up Plan:   Lauretta Grill Upstream Scheduler

## 2021-03-14 ENCOUNTER — Other Ambulatory Visit: Payer: Self-pay | Admitting: Neurology

## 2021-03-24 ENCOUNTER — Telehealth: Payer: Self-pay | Admitting: Family Medicine

## 2021-03-24 NOTE — Chronic Care Management (AMB) (Signed)
  Chronic Care Management   Outreach Note  03/24/2021 Name: Carlos [name] MRN: [id number] DOB: 1941-08-20  Referred by: [name], MD Reason for referral : No chief complaint on file.   Third unsuccessful telephone outreach was attempted today. The patient was referred to the pharmacist for assistance with care management and care coordination.   Follow Up Plan:   Lauretta Grill Upstream Scheduler

## 2021-04-03 NOTE — Progress Notes (Signed)
Assessment/Plan:   1.  Parkinsons Disease  -Continue carbidopa/levodopa 25/100, 2 tablets 3 times per day.  Can take extra 1/2 tablet prn  -Continue carbidopa/levodopa 50/200 at bedtime  -discussed getting into exercise once he moves.   2.  PDD with Hallucinations  -On donepezil, 10 mg daily.  -discussed growing caregiver burden with pt/wife  -consider nuplazid if worsen but seem to be ok right now  3.  GAD/depression  -On Lexapro, 10 mg daily.  4.  A-fib  -Follows with cardiology.  On Eliquis.  Subjective:   Carlos [name] was seen today in follow up for Parkinsons disease.  Patient with wife who supplements history.  Daughter on the phone listening to visit.  My previous records were reviewed prior to todays visit as well as outside records available to me.  Did trip up the stairs at vacation at the beach but no other falls.  No lightheadedness or near syncope.  In regards to hallucinations, they state that this is stable (still sees cat some but not worse).  Can have some vivid dreams at night but they aren't scary/he isn't afraid.  Moving right now into an independent living facility [facility]) but it has step up care.  They will physically move on Saturday.  They are downsizing into a one bedroom which has been stressful.  Anxiety has been increased, although he is happy about the move.  He is repetitive over details with the changes - no agitation.    Current prescribed movement disorder medications: Carbidopa/levodopa 25/100, 2 tablets 3 times per day (last few days they have been giving extra 1/2 tab prn) Carbidopa/levodopa 50/200 at bed Donepezil Lexapro, 10 mg   ALLERGIES:  No Known Allergies  CURRENT MEDICATIONS:  Outpatient Encounter Medications as of 04/04/2021  Medication Sig   apixaban (ELIQUIS) 5 MG TABS tablet Take 1 tablet (5 mg total) by mouth 2 (two) times daily.   atenolol (TENORMIN) 25 MG tablet TAKE 1 TABLET BY MOUTH DAILY   carbidopa-levodopa  (SINEMET CR) 50-200 MG tablet TAKE 1 TABLET BY MOUTH AT BEDTIME   carbidopa-levodopa (SINEMET IR) 25-100 MG tablet TAKE 2 TABLETS BY MOUTH THREE TIMES DAILY (Patient taking differently: May take an extra 1/2 tablet as needed)   donepezil (ARICEPT) 10 MG tablet TAKE 1 TABLET(10 MG) BY MOUTH AT BEDTIME   escitalopram (LEXAPRO) 10 MG tablet Take 1 tablet (10 mg total) by mouth daily.   iron polysaccharides (NIFEREX) 150 MG capsule Take 150 mg by mouth every other day.   simvastatin (ZOCOR) 10 MG tablet Take 1 tablet (10 mg total) by mouth at bedtime.   No facility-administered encounter medications on file as of 04/04/2021.    Objective:   PHYSICAL EXAMINATION:    VITALS:   Vitals:   04/04/21 1111  BP: 120/68  Pulse: 68  SpO2: 95%  Weight: 185 lb 6.4 oz (84.1 kg)  Height: 6' (1.829 m)     GEN:  The patient appears stated age and is in NAD. HEENT:  Normocephalic, atraumatic.  The mucous membranes are moist. The superficial temporal arteries are without ropiness or tenderness. CV:  RRR Lungs:  CTAB Neck/HEME:  There are no carotid bruits bilaterally.  Neurological examination:  Orientation: The patient is alert and oriented to person/place.   Cranial nerves: There is good facial symmetry with facial hypomimia. The speech is fluent and clear. Minimal dysphasia.  Soft palate rises symmetrically and there is no tongue deviation. Hearing is intact to conversational tone.  Sensation: Sensation is intact to light touch throughout Motor: Strength is at least antigravity x4.  Movement examination:  Tone: There is normal tone in the UE/LE Abnormal movements: there is near constant RUE rest tremor Coordination:  There is mild decremation with RAM's Gait and Station: The patient has no difficulty arising out of a deep-seated chair without the use of the hands. The patient's stride length is slighty decreased and he drags the right leg  I have reviewed and interpreted the following labs  independently    Chemistry      Component Value Date/Time   NA 140 01/10/2021 1159   NA 137 05/13/2020 0000   K 3.7 01/10/2021 1159   CL 100 01/10/2021 1159   CO2 35 (H) 01/10/2021 1159   BUN 19 01/10/2021 1159   BUN 22 (A) 05/13/2020 0000   CREATININE 1.06 01/10/2021 1159   CREATININE 1.02 04/05/2020 1212   GLU 93 05/13/2020 0000      Component Value Date/Time   CALCIUM 9.7 01/10/2021 1159   ALKPHOS 68 01/10/2021 1159   AST 19 01/10/2021 1159   ALT 9 01/10/2021 1159   BILITOT 2.1 (H) 01/10/2021 1159       Lab Results  Component Value Date   WBC 6.1 01/10/2021   HGB 16.6 01/10/2021   HCT 48.9 01/10/2021   MCV 91.5 01/10/2021   PLT 115.0 (L) 01/10/2021    Lab Results  Component Value Date   TSH 1.72 05/13/2020     Total time spent on today's visit was 35 minutes, including both face-to-face time and nonface-to-face time.  Time included that spent on review of records (prior notes available to me/labs/imaging if pertinent), discussing treatment and goals, answering patient's questions and coordinating care.  Cc:  [name], MD

## 2021-04-04 ENCOUNTER — Other Ambulatory Visit: Payer: Self-pay

## 2021-04-04 ENCOUNTER — Ambulatory Visit: Payer: Medicare Other | Admitting: Neurology

## 2021-04-04 ENCOUNTER — Encounter: Payer: Self-pay | Admitting: Neurology

## 2021-04-04 VITALS — BP 120/68 | HR 68 | Ht 72.0 in | Wt 185.4 lb

## 2021-04-04 DIAGNOSIS — F0280 Dementia in other diseases classified elsewhere without behavioral disturbance: Secondary | ICD-10-CM | POA: Diagnosis not present

## 2021-04-04 DIAGNOSIS — G20 Parkinson's disease: Secondary | ICD-10-CM

## 2021-04-04 MED ORDER — CARBIDOPA-LEVODOPA ER 50-200 MG PO TBCR: 1.0000 | EXTENDED_RELEASE_TABLET | Freq: Every day | ORAL | 1 refills | Status: DC

## 2021-04-04 MED ORDER — ESCITALOPRAM OXALATE 10 MG PO TABS: 10.0000 mg | ORAL_TABLET | Freq: Every day | ORAL | 1 refills | Status: DC

## 2021-04-04 MED ORDER — CARBIDOPA-LEVODOPA 25-100 MG PO TABS: ORAL_TABLET | ORAL | 1 refills | Status: DC

## 2021-04-04 MED ORDER — DONEPEZIL HCL 10 MG PO TABS: ORAL_TABLET | ORAL | 1 refills | Status: DC

## 2021-04-14 IMAGING — US US EXTREM  UP VENOUS*R*
1 series · 13 of 24 positions shown · non-contrast
Comparison: None.

CLINICAL DATA: Seven 8-year-old male with right upper extremity
redness and swelling. Status post IV insertion.



[Series 1: us extrem up venous*right* · 13 of 38 slices shown]
[im 1/38]
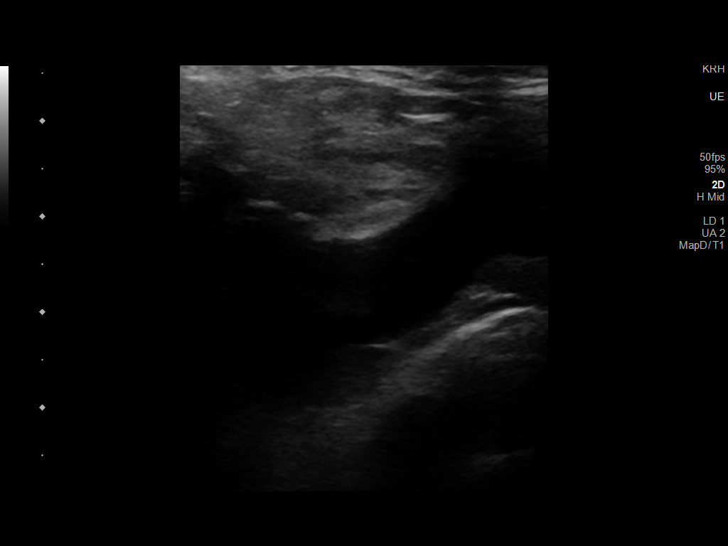
[im 4/38]
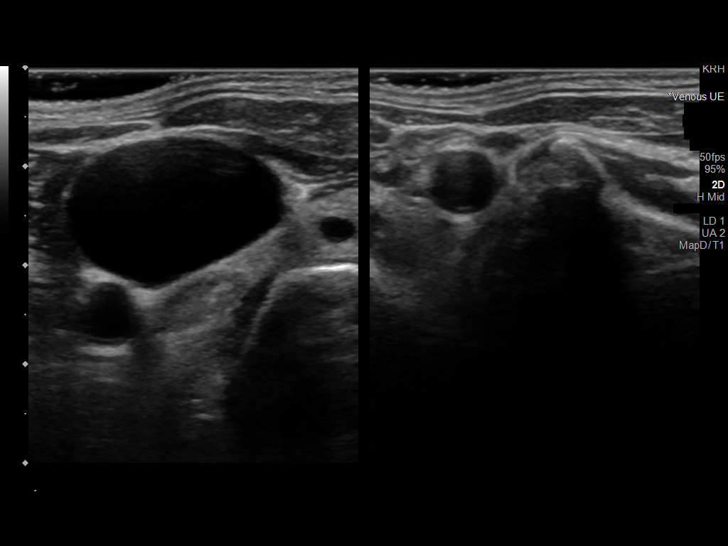
[im 7/38]
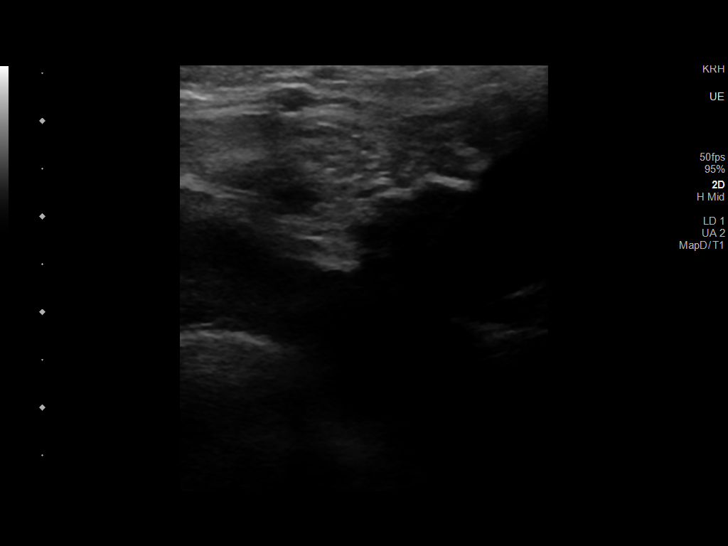
[im 10/38]
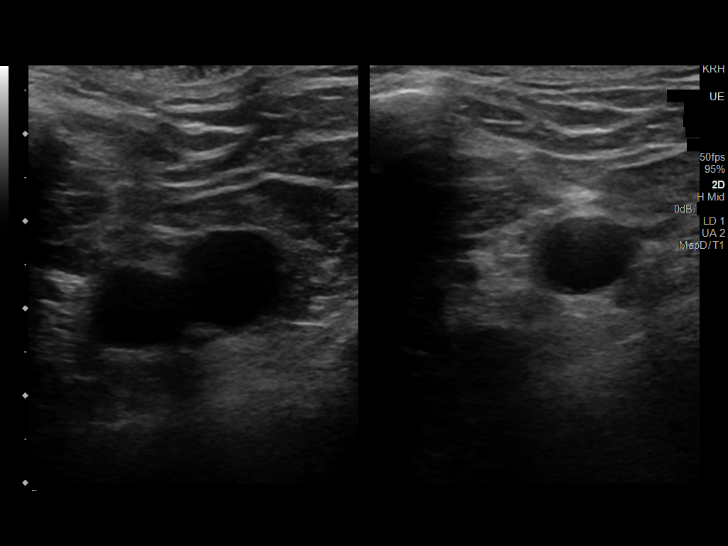
[im 13/38]
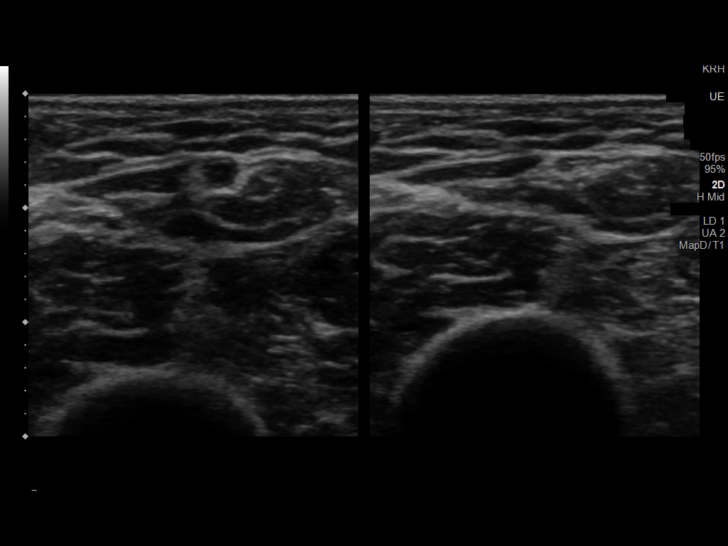
[im 17/38]
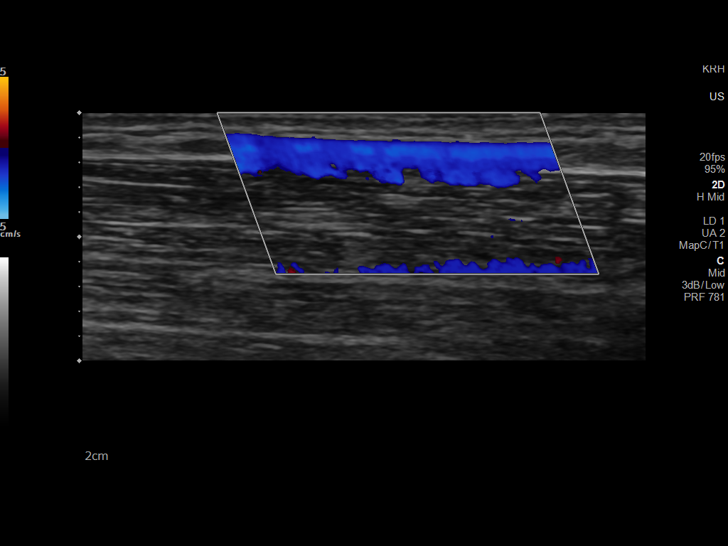
[im 20/38]
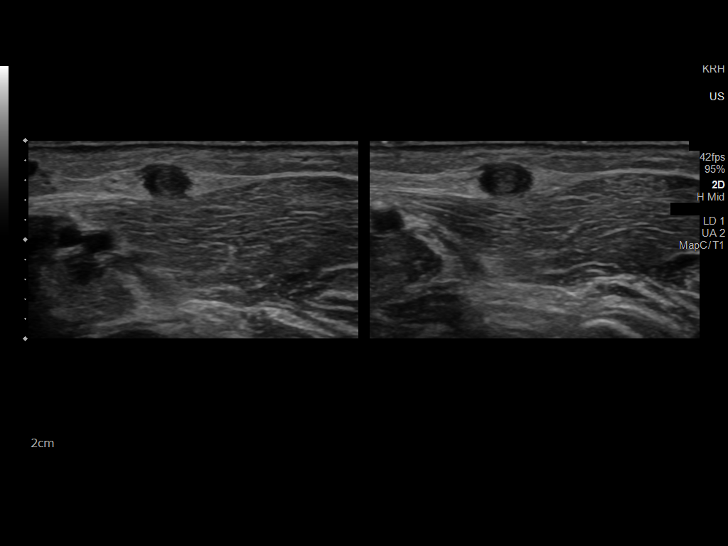
[im 21/38]
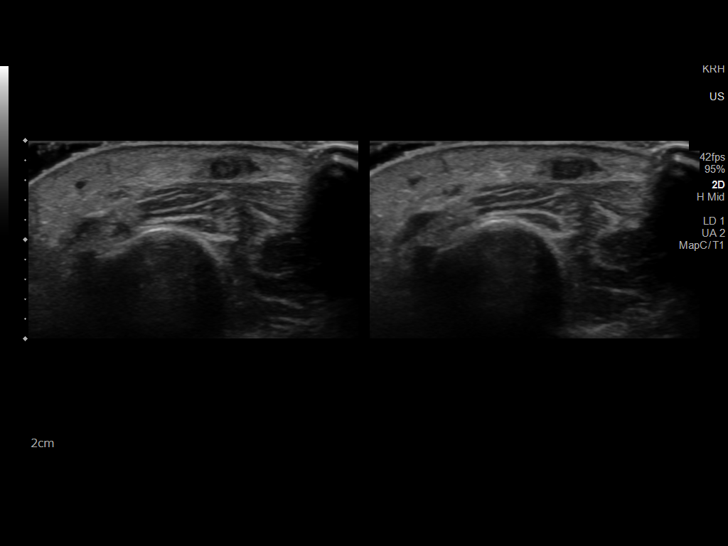
[im 25/38]
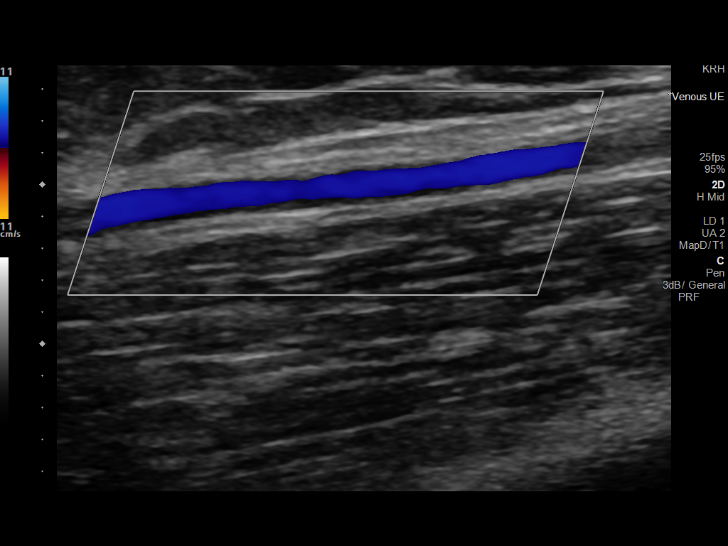
[im 28/38]
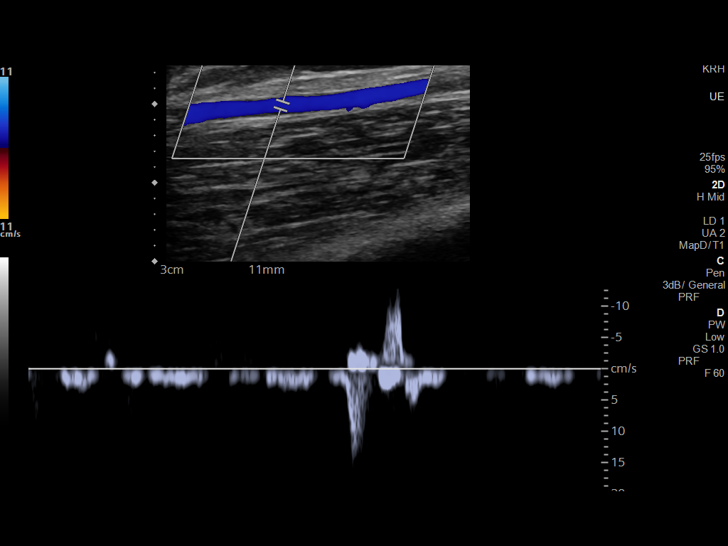
[im 31/38]
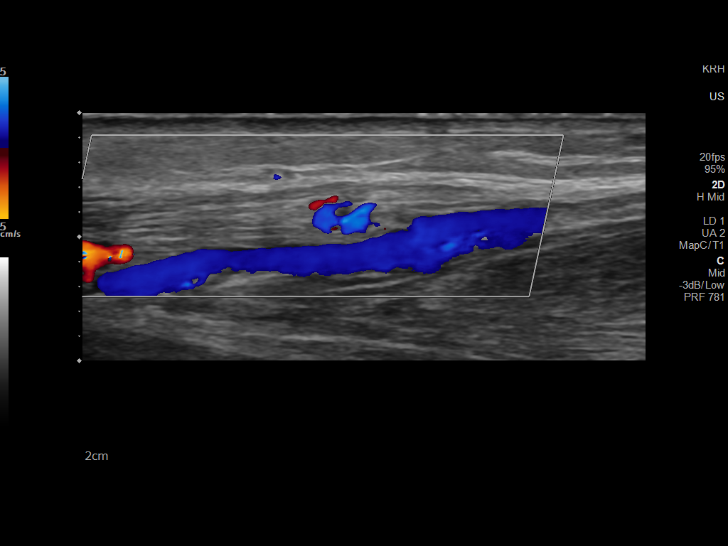
[im 34/38]
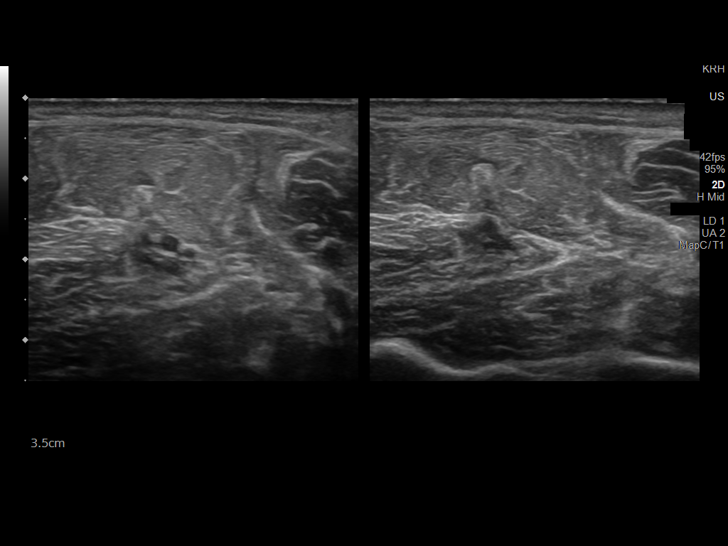
[im 38/38]
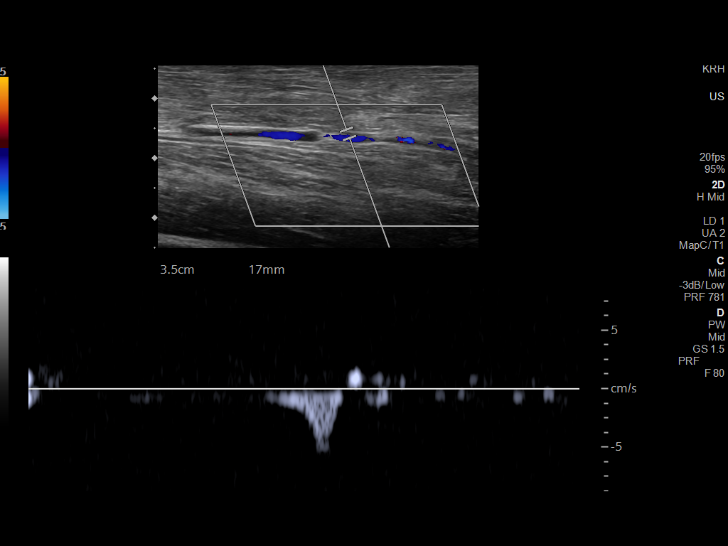

[13 of 24 positions shown; findings below may reference images not displayed]

FINDINGS: Contralateral Subclavian Vein: Respiratory phasicity is normal and
symmetric with the symptomatic side. No evidence of thrombus. Normal
compressibility.

Internal Jugular Vein: No evidence of thrombus. Normal
compressibility, respiratory phasicity and response to augmentation.

Subclavian Vein: No evidence of thrombus. Normal compressibility,
respiratory phasicity and response to augmentation.

Axillary Vein: No evidence of thrombus. Normal compressibility,
respiratory phasicity and response to augmentation.

Cephalic Vein: There is complete occlusive thrombus in the cephalic
vein with none compression of the vessel in the forearm. There is
reconstitution of flow in the proximal upper arm.

Basilic Vein: No evidence of thrombus. Normal compressibility,
respiratory phasicity and response to augmentation.

Brachial Veins: No evidence of thrombus. Normal compressibility,
respiratory phasicity and response to augmentation.

Radial Veins: No evidence of thrombus. Normal compressibility,
respiratory phasicity and response to augmentation.

Ulnar Veins: No evidence of thrombus. Normal compressibility,
respiratory phasicity and response to augmentation.

Venous Reflux:  None visualized.

Other Findings:  None visualized.
IMPRESSION: 1. No evidence of DVT within the right upper extremity.
2. Long segment thrombosis of the cephalic vein with reconstitution
of flow in the proximal upper arm.

## 2021-04-27 ENCOUNTER — Telehealth: Payer: Self-pay | Admitting: Neurology

## 2021-04-27 NOTE — Telephone Encounter (Signed)
Pt's wife called in stating there must be a mix up with the carbidopa levbodopa 25-'100mg'$ . She says he is supposed to be able to take 2 tablets 3 times a day and can take an extra tablet as needed. She says the prescription would not give him enough to take 7 tablets a day.

## 2021-04-27 NOTE — Telephone Encounter (Signed)
Pt wife called back in, pharmacy did some research and the error was on their end, not ours. She wanted to call and update that info

## 2021-05-29 ENCOUNTER — Other Ambulatory Visit: Payer: Self-pay | Admitting: Family Medicine

## 2021-06-14 ENCOUNTER — Telehealth: Payer: Self-pay | Admitting: Neurology

## 2021-06-14 NOTE — Telephone Encounter (Signed)
Called patients wife to find out more information Patient decline has been over the last few days. He is currently taking his carbidopa levodopa and donepezil. Patients wife stated he is not sleeping and tends to mutter and ramble under his breath. All night . He has shown some aggression she said he is still physically strong. She is most concerned with his dementia and the fact he isn't communicating with her any longer. She is in a facility were there are multiple levels they have just moved into the first level and are living in there own apt there she can move him but wanted to reach out and see if there were any other options. I did see possible Nuplazid. Wife said she would be willing to try anything at this point. They are at [facility] in [place]

## 2021-06-14 NOTE — Telephone Encounter (Signed)
Pt's wife called in stating the pt has been getting worse with his dementia. They recently moved into an independent living area and that seems to have made the dementia worse. She stated it has progressed over the last 3 days or so. He doesn't sleep at night and thinks she is his mother. She is not sure what to do and would like to talk with someone.

## 2021-06-14 NOTE — Telephone Encounter (Signed)
Contacted patients wife and she is going to have him tested ASAP

## 2021-06-16 ENCOUNTER — Encounter: Payer: Self-pay | Admitting: Family Medicine

## 2021-06-16 ENCOUNTER — Other Ambulatory Visit: Payer: Self-pay

## 2021-06-16 ENCOUNTER — Ambulatory Visit (INDEPENDENT_AMBULATORY_CARE_PROVIDER_SITE_OTHER): Payer: Medicare Other | Admitting: Family Medicine

## 2021-06-16 VITALS — BP 120/84 | HR 60 | Temp 98.1°F | Ht 72.0 in | Wt 186.6 lb

## 2021-06-16 DIAGNOSIS — R410 Disorientation, unspecified: Secondary | ICD-10-CM

## 2021-06-16 DIAGNOSIS — G20 Parkinson's disease: Secondary | ICD-10-CM

## 2021-06-16 DIAGNOSIS — R351 Nocturia: Secondary | ICD-10-CM

## 2021-06-16 DIAGNOSIS — I10 Essential (primary) hypertension: Secondary | ICD-10-CM | POA: Diagnosis not present

## 2021-06-16 DIAGNOSIS — E785 Hyperlipidemia, unspecified: Secondary | ICD-10-CM | POA: Diagnosis not present

## 2021-06-16 DIAGNOSIS — R319 Hematuria, unspecified: Secondary | ICD-10-CM

## 2021-06-16 LAB — URINALYSIS, MICROSCOPIC ONLY: WBC, UA: NONE SEEN (ref 0–?)

## 2021-06-16 LAB — POC URINALSYSI DIPSTICK (AUTOMATED)
Bilirubin, UA: NEGATIVE
Blood, UA: POSITIVE
Glucose, UA: NEGATIVE
Ketones, UA: NEGATIVE
Leukocytes, UA: NEGATIVE
Nitrite, UA: NEGATIVE
Protein, UA: NEGATIVE
Spec Grav, UA: 1.015 (ref 1.010–1.025)
Urobilinogen, UA: 0.2 E.U./dL
pH, UA: 6 (ref 5.0–8.0)

## 2021-06-16 NOTE — Addendum Note (Signed)
Addended by: Linton Ham on: 06/16/2021 10:44 AM   Modules accepted: Orders

## 2021-06-16 NOTE — Patient Instructions (Addendum)
Health Maintenance Due  Topic Date Due   COVID-19 Vaccine (4 - Booster for [employer] series)  - Please consider new bivalent booster shot this season.   -If had covid recently, wait until 3 months to get.   11/01/2020   Try Simvastatin 10 mg every other day.  Try Voltaren gel/Diclofenac for your knees.   Please stop by lab before you go If you have mychart- we will send your results within 3 business days of [place] receiving them.  If you do not have mychart- we will call you about results within 5 business days of [place] receiving them.  *please also note that you will see labs on mychart as soon as they post. I will later go in and write notes on them- will say "notes from Dr. [name]" Carlos [name]  Recommended follow up: Keep November visit.

## 2021-06-16 NOTE — Progress Notes (Signed)
Phone [phone] In person visit   Subjective:   Carlos [name] is a 80 y.o. year old very pleasant male patient who presents for/with See problem oriented charting Chief Complaint  Patient presents with   Altered Mental Status    More confusion Patients wife said he's had a few nights where he has been up confused most of the night.     This visit occurred during the SARS-CoV-2 public health emergency.  Safety protocols were in place, including screening questions prior to the visit, additional usage of staff PPE, and extensive cleaning of exam room while observing appropriate contact time as indicated for disinfecting solutions.   Past Medical History-  Patient Active Problem List   Diagnosis Date Noted   Parkinson's disease ([place]) 03/09/2016    Priority: 1.   PAF (paroxysmal atrial fibrillation) ([place]) 02/03/2010    Priority: 1.   Aortic insufficiency 07/20/2015    Priority: 2.   Chest pain     Priority: 2.   Essential hypertension     Priority: 2.   Mitral regurgitation     Priority: 2.   Diastolic dysfunction     Priority: 2.   Dyslipidemia     Priority: 2.   Primary osteoarthritis of right hip 06/19/2018    Priority: 3.   Allergy 12/07/2016    Priority: 3.   Sinus node dysfunction ([place]) 08/02/2015    Priority: 3.   Hx of colonic polyps 01/13/2013    Priority: 3.   Incomplete RBBB     Priority: 3.   LVH (left ventricular hypertrophy)     Priority: 3.   Sinus bradycardia     Priority: 3.   PVC (premature ventricular contraction)     Priority: 3.   Allergic rhinitis 12/28/2009    Priority: 3.   History of basal cell carcinoma of skin 10/26/2008    Priority: 3.   Anxiety state 10/21/2007    Priority: 3.   Osteoarthritis 10/21/2007    Priority: 3.   RENAL CALCULUS, HX OF 10/20/2007    Priority: 3.   Acute GI bleeding 12/22/2019   GI bleeding 12/22/2019   Rectal bleeding    Thrombocytopenia ([place]) 09/29/2019   Combined forms of age-related cataract  12/17/2012   Presbyopia 12/17/2012   Ptosis of eyelid 12/17/2012   Vitreous syneresis 12/17/2012    Medications- reviewed and updated Current Outpatient Medications  Medication Sig Dispense Refill   apixaban (ELIQUIS) 5 MG TABS tablet Take 1 tablet (5 mg total) by mouth 2 (two) times daily. 180 tablet 3   atenolol (TENORMIN) 25 MG tablet TAKE 1 TABLET BY MOUTH DAILY 90 tablet 3   carbidopa-levodopa (SINEMET CR) 50-200 MG tablet Take 1 tablet by mouth at bedtime. 90 tablet 1   carbidopa-levodopa (SINEMET IR) 25-100 MG tablet 2 po tid and extra 1/2 tablet prn 630 tablet 1   donepezil (ARICEPT) 10 MG tablet TAKE 1 TABLET(10 MG) BY MOUTH AT BEDTIME 90 tablet 1   escitalopram (LEXAPRO) 10 MG tablet Take 1 tablet (10 mg total) by mouth daily. 90 tablet 1   iron polysaccharides (NIFEREX) 150 MG capsule Take 150 mg by mouth every other day.     simvastatin (ZOCOR) 10 MG tablet Take 1 tablet (10 mg total) by mouth at bedtime. 90 tablet 3   No current facility-administered medications for this visit.     Objective:  BP 120/84   Pulse 60   Temp 98.1 F (36.7 C) (Temporal)   Ht 6' (  1.829 m)   Wt 186 lb 9.6 oz (84.6 kg)   SpO2 95%   BMI 25.31 kg/m  Gen: NAD, resting comfortably CV: RRR no murmurs rubs or gallops Lungs: CTAB no crackles, wheeze, rhonchi Abdomen: soft/nontender/nondistended/normal bowel sounds. No rebound or guarding.  Ext: no edema Skin: warm, dry Neuro: baseline tremor, looks to wife for most answers    Assessment and Plan   #Social update Now in assistant living facility due to aging process and Parkinson's.in [place], [place] and they will likely transition primary care there-I think that was the appropriate move.  I recommended staying with Dr. Carles Collet movement disorder specialist.  # Confusion in patient with Parkinson's and parkinsonian dementia S: Patient has recently had more confusion overall.  He has had several nights perhaps 3 days(over the weekend) where he has  been confused most of the night. This is a significant shift for him. At baseline talks a lot in sleep- but during these nights wanted to physically get out of bed and leave and go somewhere. Did move to assisted living since last visit. Last night was a good night thankfully. Some increased daytime confusion as well- memory recall including people that didn't used to be as much of an issue.  - they had a busy Thursday Friday with mountain trip and Saturday was busy as well- may have been overstretched.   No new medications reported.  Patient with baseline Parkinson's and dementia related to this.  He is on donepezil 10 mg.   Called Dr. Carles Collet and was advised to get UA and culture to make sure no infection. No dysuria or polyuria. 1-2x a night nocturia stable.  A/P: Patient with increased confusion at night over the weekend after going to bed.  Seems of improved this week.  I wonder if he was overstretched by a busy weekend.  No current urinary symptoms other than stable nocturia-we will get urinalysis and culture -We considered updating baseline labs such as CBC, CMP, TSH, B12 with memory changes but opted to hold off on this until next visit at least since symptoms seem to be improving -Parkinson's can be associated with some bad dreams-I have also seen atenolol associated with some dreaming in the past but I think we need to continue this for now with A. fib history  #Bilateral knee pain-patient reports bilateral knee pain over the last few months-discussed trying Voltaren/diclofenac gel as oral NSAIDs would not be ideal with Eliquis  #hypertension S: medication: atenolol 25 mg. Prior on losartan hctz as well BP Readings from Last 3 Encounters:  06/16/21 120/84  04/04/21 120/68  01/10/21 113/64  A/P: Controlled. Continue current medications.   #hyperlipidemia S: Medication:simvastatin 10 mg  Lab Results  Component Value Date   CHOL 105 01/10/2021   HDL 49.40 01/10/2021   LDLCALC 40  01/10/2021   LDLDIRECT 37.0 12/17/2017   TRIG 76.0 01/10/2021   CHOLHDL 2 01/10/2021   A/P: No history of heart attack or stroke.  Excellent control last visit even with reducing dose- we opted to try every other day until next visit and we can recheck LDL at that time with reduced dose  Recommended follow up: Keep November visit Future Appointments  Date Time Provider [place]  07/21/2021 11:20 AM [name], MD [facility]  10/20/2021 11:15 AM Tat, Eustace Quail, DO [facility] None    Lab/Order associations:   ICD-10-CM   1. Confusion  R41.0 POCT Urinalysis Dipstick (Automated)    Urine Culture  2. Nocturia  R35.1 POCT Urinalysis Dipstick (Automated)    Urine Culture    3. Parkinson's disease ([place])  G20     4. Essential hypertension  I10     5. Dyslipidemia  E78.5       I,Jada Bradford,acting as a scribe for Garret Reddish, MD.,have documented all relevant documentation on the behalf of Garret Reddish, MD,as directed by  Garret Reddish, MD while in the presence of Garret Reddish, MD.   I, Garret Reddish, MD, have reviewed all documentation for this visit. The documentation on 06/16/21 for the exam, diagnosis, procedures, and orders are all accurate and complete.   Return precautions advised.  Garret Reddish, MD

## 2021-06-17 LAB — URINE CULTURE
MICRO NUMBER:: 12504477
SPECIMEN QUALITY:: ADEQUATE

## 2021-07-19 NOTE — Progress Notes (Incomplete)
Phone [phone] In person visit   Subjective:   Carlos [name] is a 80 y.o. year old very pleasant male patient who presents for/with See problem oriented charting No chief complaint on file.   This visit occurred during the SARS-CoV-2 public health emergency.  Safety protocols were in place, including screening questions prior to the visit, additional usage of staff PPE, and extensive cleaning of exam room while observing appropriate contact time as indicated for disinfecting solutions.   Past Medical History-  Patient Active Problem List   Diagnosis Date Noted   Acute GI bleeding 12/22/2019   GI bleeding 12/22/2019   Rectal bleeding    Thrombocytopenia ([place]) 09/29/2019   Primary osteoarthritis of right hip 06/19/2018   Allergy 12/07/2016   Parkinson's disease ([place]) 03/09/2016   Sinus node dysfunction ([facility]) 08/02/2015   Aortic insufficiency 07/20/2015   Hx of colonic polyps 01/13/2013   Combined forms of age-related cataract 12/17/2012   Presbyopia 12/17/2012   Ptosis of eyelid 12/17/2012   Vitreous syneresis 12/17/2012   Incomplete RBBB    Chest pain    Essential hypertension    Mitral regurgitation    LVH (left ventricular hypertrophy)    Diastolic dysfunction    Sinus bradycardia    PVC (premature ventricular contraction)    Dyslipidemia    PAF (paroxysmal atrial fibrillation) ([place]) 02/03/2010   Allergic rhinitis 12/28/2009   History of basal cell carcinoma of skin 10/26/2008   Anxiety state 10/21/2007   Osteoarthritis 10/21/2007   RENAL CALCULUS, HX OF 10/20/2007    Medications- reviewed and updated Current Outpatient Medications  Medication Sig Dispense Refill   apixaban (ELIQUIS) 5 MG TABS tablet Take 1 tablet (5 mg total) by mouth 2 (two) times daily. 180 tablet 3   atenolol (TENORMIN) 25 MG tablet TAKE 1 TABLET BY MOUTH DAILY 90 tablet 3   carbidopa-levodopa (SINEMET CR) 50-200 MG tablet Take 1 tablet by mouth at bedtime. 90 tablet 1    carbidopa-levodopa (SINEMET IR) 25-100 MG tablet 2 po tid and extra 1/2 tablet prn 630 tablet 1   donepezil (ARICEPT) 10 MG tablet TAKE 1 TABLET(10 MG) BY MOUTH AT BEDTIME 90 tablet 1   escitalopram (LEXAPRO) 10 MG tablet Take 1 tablet (10 mg total) by mouth daily. 90 tablet 1   iron polysaccharides (NIFEREX) 150 MG capsule Take 150 mg by mouth every other day.     simvastatin (ZOCOR) 10 MG tablet Take 1 tablet (10 mg total) by mouth at bedtime. 90 tablet 3   No current facility-administered medications for this visit.     Objective:  There were no vitals taken for this visit. Gen: NAD, resting comfortably CV: RRR no murmurs rubs or gallops Lungs: CTAB no crackles, wheeze, rhonchi Abdomen: soft/nontender/nondistended/normal bowel sounds. No rebound or guarding.  Ext: no edema Skin: warm, dry Neuro: grossly normal, moves all extremities  ***    Assessment and Plan   #Social update Now in assistant living facility due to aging process and Parkinson's.in [place], [place] and they will likely transition primary care there-I think that was the appropriate move.  I recommended staying with Dr. Carles Collet movement disorder specialist.   # Confusion in patient with Parkinson's and parkinsonian dementia S: Patient had  more confusion overall.  He had several nights perhaps 3 days(over the weekend) where he had been confused most of the night. This was a significant shift for him. At baseline talks a lot in sleep- but during these nights wanted to physically get  out of bed and leave and go somewhere. Did move to assisted living since last visit. Last night was a good night thankfully. Some increased daytime confusion as well- memory recall included people that didn't used to be as much of an issue.  - they had a busy Thursday Friday with mountain trip and Saturday was busy as well- may have been overstretched.    No new medications reported.  Patient with baseline Parkinson's and dementia related to this.   He is on donepezil 10 mg.    Called Dr. Carles Collet and was advised to get UA and culture to make sure no infection. No dysuria or polyuria. 1-2x a night nocturia stable.  --We considered updating baseline labs such as CBC, CMP, TSH, B12 with memory changes but opted to hold off on this until next visit at least since symptoms seem to be improving --Parkinson's can be associated with some bad dreams-I had also seen atenolol associated with some dreaming in the past but I think we need to continue this for now with A. fib history A/P: ***   #Bilateral knee pain-patient reported bilateral knee pain over the last few months on 06/16/21-discussed trying Voltaren/diclofenac gel as oral NSAIDs would not be ideal with Eliquis   #hypertension S: medication: atenolol 25 mg daily. Prior on losartan hctz as well Home readings #s: *** BP Readings from Last 3 Encounters:  06/16/21 120/84  04/04/21 120/68  01/10/21 113/64  A/P: ***  #hyperlipidemia S: Medication:simvastatin 10 mg at bedtime Lab Results  Component Value Date   CHOL 105 01/10/2021   HDL 49.40 01/10/2021   LDLCALC 40 01/10/2021   LDLDIRECT 37.0 12/17/2017   TRIG 76.0 01/10/2021   CHOLHDL 2 01/10/2021   A/P: ***  Health Maintenance Due  Topic Date Due   COVID-19 Vaccine (4 - Booster for [employer] series) 08/29/2020   Recommended follow up: No follow-ups on file. Future Appointments  Date Time Provider [place]  07/21/2021 11:20 AM [name], MD [facility]  10/20/2021 11:15 AM Tat, Eustace Quail, DO [facility] None    Lab/Order associations: No diagnosis found.  No orders of the defined types were placed in this encounter.   I,Jada Bradford,acting as a scribe for Garret Reddish, MD.,have documented all relevant documentation on the behalf of Garret Reddish, MD,as directed by  Garret Reddish, MD while in the presence of Garret Reddish, MD.  *** Return precautions advised.  Burnett Corrente

## 2021-07-21 ENCOUNTER — Ambulatory Visit: Payer: Medicare Other | Admitting: Family Medicine

## 2021-07-25 ENCOUNTER — Other Ambulatory Visit: Payer: Self-pay | Admitting: Neurology

## 2021-07-25 DIAGNOSIS — F0280 Dementia in other diseases classified elsewhere without behavioral disturbance: Secondary | ICD-10-CM

## 2021-07-25 DIAGNOSIS — G20 Parkinson's disease: Secondary | ICD-10-CM

## 2021-07-30 ENCOUNTER — Other Ambulatory Visit: Payer: Self-pay | Admitting: Neurology

## 2021-07-30 DIAGNOSIS — G20 Parkinson's disease: Secondary | ICD-10-CM

## 2021-07-30 DIAGNOSIS — F411 Generalized anxiety disorder: Secondary | ICD-10-CM

## 2021-07-30 DIAGNOSIS — F330 Major depressive disorder, recurrent, mild: Secondary | ICD-10-CM

## 2021-07-31 ENCOUNTER — Other Ambulatory Visit: Payer: Self-pay

## 2021-08-31 ENCOUNTER — Other Ambulatory Visit: Payer: Self-pay

## 2021-08-31 ENCOUNTER — Telehealth: Payer: Self-pay | Admitting: Neurology

## 2021-08-31 DIAGNOSIS — R4182 Altered mental status, unspecified: Secondary | ICD-10-CM
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
## 2021-08-31 NOTE — Telephone Encounter (Signed)
Patients wife would like some advice regarding Yomar sleep pattern. he isn't sleeping well and she would like to know what can be done
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
## 2021-09-05 ENCOUNTER — Telehealth: Payer: Self-pay | Admitting: Neurology

## 2021-09-05 NOTE — Telephone Encounter (Signed)
Patients wife called and would like a call back to discuss some things regarding [name]. She would like to know how to handle some things that's going on.

## 2021-09-06 NOTE — Telephone Encounter (Signed)
Called patients wife back to get more information I was able to move patients CT Scan up was originally scheduled for 09-28-2021 patient will now be going on Tuesday the 10th at 2:40 spoke to wife and that will work. Patients wife called because she is alarmed with the changes in patients behavior. Patient went several days without sleeping he was up all night long going through cabinets.  Patient becomes very restless and agitated and has become very agitated with his wife. She has been frightened because this is not usual behavior for this patient who has always been very easy going and never become aggressive with her in the past. Patients wife called family to come and sit with her because she didn't want to be alone with patient t night time.  Patients tremors have become worse and this has happened since Christmas no sign of UTI and they have been screened.  Patients wife wanting to know if there is anything she could give him when he becomes agitated and restless to help him to relax. She said maybe not all the time but during these episodes that may help him rest and put her more at ease

## 2021-09-12 ENCOUNTER — Other Ambulatory Visit: Payer: Self-pay

## 2021-09-12 ENCOUNTER — Ambulatory Visit
Admission: RE | Admit: 2021-09-12 | Discharge: 2021-09-12 | Disposition: A | Discharge status: Home / Self Care | Payer: Medicare Other | Source: Ambulatory Visit | Attending: Neurology | Admitting: Neurology

## 2021-09-12 DIAGNOSIS — R4182 Altered mental status, unspecified: Secondary | ICD-10-CM

## 2021-09-13 ENCOUNTER — Telehealth: Payer: Self-pay | Admitting: Neurology
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
## 2021-09-13 NOTE — Telephone Encounter (Signed)
Patients wife called me back she stated patient was screened with an at home urinalysis and she had a nurse help her and to result the test it was negative. I  am not sure what type of test it was but she did tell me it was for urinary infections.

## 2021-09-13 NOTE — Telephone Encounter (Signed)
Pt coming in Friday for appt.  See your 09/05/21 note.  States that wife stated that patient had been screened for urinary tract infection.  I do not see that.  Find out where we can get results of that.  Last labs that I see were from October.

## 2021-09-15 ENCOUNTER — Other Ambulatory Visit: Payer: Self-pay

## 2021-09-15 ENCOUNTER — Ambulatory Visit: Payer: Medicare Other | Admitting: Neurology

## 2021-09-15 ENCOUNTER — Encounter: Payer: Self-pay | Admitting: Neurology

## 2021-09-15 VITALS — BP 116/72 | HR 62 | Ht 72.0 in | Wt 188.4 lb

## 2021-09-15 DIAGNOSIS — G20 Parkinson's disease: Secondary | ICD-10-CM

## 2021-09-15 DIAGNOSIS — F02818 Dementia in other diseases classified elsewhere, unspecified severity, with other behavioral disturbance: Secondary | ICD-10-CM | POA: Diagnosis not present

## 2021-09-15 DIAGNOSIS — G20A1 Parkinson's disease without dyskinesia, without mention of fluctuations: Secondary | ICD-10-CM

## 2021-09-15 MED ORDER — QUETIAPINE FUMARATE 25 MG PO TABS: ORAL_TABLET | ORAL | 1 refills | Status: DC

## 2021-09-15 NOTE — Patient Instructions (Signed)
Start quetiapine 25 mg, 1/2 tablet at bedtime and may given an extra 1/2 tablet if needed

## 2021-09-17 ENCOUNTER — Other Ambulatory Visit: Payer: Self-pay | Admitting: Family Medicine

## 2021-09-26 ENCOUNTER — Other Ambulatory Visit: Payer: Self-pay

## 2021-09-26 ENCOUNTER — Telehealth: Payer: Self-pay | Admitting: Neurology

## 2021-09-26 MED ORDER — QUETIAPINE FUMARATE 25 MG PO TABS: ORAL_TABLET | ORAL | 1 refills | Status: DC

## 2021-09-26 NOTE — Telephone Encounter (Signed)
Patients wife telling me on our call and the Quetiapine doesn't appear to be helping him Patient confused and having Hallucinations and he he is still up all night wandering and is restless and jerking when he does try to sleep. Patient was not able to finally relax enough to sleep until 4:00 this morning. Patients wife [name] said if she needs to give it more time she is willing to do that or if something else she needs to do. She wanted to let dr. Carles Collet know and see if anything could be adjusted

## 2021-09-26 NOTE — Telephone Encounter (Signed)
Patients wife called stating she has some questions about the new medication Quetiapine that Carlos [name] is taking.  She said his sleeping pattern seems to be different.  She would just like to learn more about it.

## 2021-09-27 NOTE — Telephone Encounter (Signed)
Called wife back and spoke to her about the medication

## 2021-09-27 NOTE — Telephone Encounter (Signed)
Called patients wife back to talk to her about medication change. She let me know since she didn't hear back from me yesterday she didn't give patient his quetiapine last night and he slept through the night without any disturbances. Patients wife said she would do whatever you wanted her too but wanted to let you know that he did better last night without te medication

## 2021-09-28 ENCOUNTER — Other Ambulatory Visit: Payer: Medicare Other

## 2021-10-17 ENCOUNTER — Other Ambulatory Visit: Payer: Self-pay | Admitting: Neurology

## 2021-10-17 DIAGNOSIS — F330 Major depressive disorder, recurrent, mild: Secondary | ICD-10-CM

## 2021-10-17 DIAGNOSIS — R4182 Altered mental status, unspecified: Secondary | ICD-10-CM

## 2021-10-17 DIAGNOSIS — G20 Parkinson's disease: Secondary | ICD-10-CM

## 2021-10-17 DIAGNOSIS — F02818 Dementia in other diseases classified elsewhere, unspecified severity, with other behavioral disturbance: Secondary | ICD-10-CM

## 2021-10-20 ENCOUNTER — Other Ambulatory Visit: Payer: Self-pay

## 2021-10-20 ENCOUNTER — Ambulatory Visit: Payer: Medicare Other | Admitting: Neurology

## 2021-10-27 ENCOUNTER — Ambulatory Visit (INDEPENDENT_AMBULATORY_CARE_PROVIDER_SITE_OTHER): Payer: Medicare Other

## 2021-10-27 ENCOUNTER — Ambulatory Visit: Payer: Medicare Other | Admitting: Neurology

## 2021-10-27 ENCOUNTER — Other Ambulatory Visit: Payer: Self-pay

## 2021-10-27 VITALS — BP 112/68 | HR 76 | Temp 97.7°F | Wt 187.6 lb

## 2021-10-27 DIAGNOSIS — Z0000 Encounter for general adult medical examination without abnormal findings: Secondary | ICD-10-CM

## 2021-10-27 NOTE — Progress Notes (Signed)
Subjective:   Carlos [name] is a 81 y.o. male who presents for Medicare Annual/Subsequent preventive examination.  Review of Systems     Cardiac Risk Factors include: advanced age (>30men, >67 women);dyslipidemia;hypertension;male gender     Objective:    Today's Vitals   10/27/21 1230  BP: 112/68  Pulse: 76  Temp: 97.7 F (36.5 C)  SpO2: 92%  Weight: 187 lb 9.6 oz (85.1 kg)   Body mass index is 25.44 kg/m.  Advanced Directives 10/27/2021 09/15/2021 04/04/2021 10/05/2020 04/04/2020 12/26/2019 12/22/2019  Does Patient Have a Medical Advance Directive? Yes Yes Yes Yes Yes Yes Yes  Type of Advance Directive Healthcare Power of Attorney Living will Living will [place];Living will [place];Living will Healthcare Power of [place]  Does patient want to make changes to medical advance directive? - - - - - - -  Copy of [place] in Chart? No - copy requested - - - - - -    Current Medications (verified) Outpatient Encounter Medications as of 10/27/2021  Medication Sig   apixaban (ELIQUIS) 5 MG TABS tablet Take 1 tablet (5 mg total) by mouth 2 (two) times daily.   atenolol (TENORMIN) 25 MG tablet TAKE 1 TABLET BY MOUTH DAILY   carbidopa-levodopa (SINEMET CR) 50-200 MG tablet Take 1 tablet by mouth at bedtime.   carbidopa-levodopa (SINEMET IR) 25-100 MG tablet TAKE 2 TABLETS BY MOUTH THREE TIMES DAILY AND EXTRA 1/2 TABLET AS NEEDED   carboxymethylcellulose (REFRESH PLUS) 0.5 % SOLN INSTILL 1 DROP IN BOTH EYES FOUR TIMES A DAY   donepezil (ARICEPT) 10 MG tablet TAKE 1 TABLET BY MOUTH AT BEDTIME   escitalopram (LEXAPRO) 10 MG tablet TAKE 1 TABLET BY MOUTH DAILY   iron polysaccharides (NIFEREX) 150 MG capsule TAKE 1 CAPSULE(150 MG) BY MOUTH DAILY   QUEtiapine (SEROQUEL) 25 MG tablet 1 tablet at bedtime, may given another 1/2 in the day prn   simvastatin (ZOCOR) 10 MG tablet Take 1 tablet (10 mg total) by  mouth at bedtime.   FLUZONE HIGH-DOSE QUADRIVALENT 0.7 ML SUSY    No facility-administered encounter medications on file as of 10/27/2021.    Allergies (verified) Patient has no known allergies.   History: Past Medical History:  Diagnosis Date   Allergic rhinitis    Allergy    Anxiety    Aortic insufficiency    mild... echo... 02/2010   Atrial flutter ([place]) 02/03/2010   Consult by Dr. Lovena Le... June, 2011.... plan rate control...  Coumadin not needed... ablation if rate not controlled well   Basal cell carcinoma    Chest pain    Nuclear, March, 2010, no ischemia   Degenerative joint disease    Diastolic dysfunction    EF 50-55%... echo.. 02/2010 (55-60% echo 2009)   Dyslipidemia    Ejection fraction    EF 50-55%, echo, June, 2011   Hemorrhoids    HEMORRHOIDS 10/21/2007   Qualifier: History of  By: [name], [name]    HTN (hypertension)    Incomplete RBBB    IRBBB; sinus bradycardia w/ PVCs   LVH (left ventricular hypertrophy)    moderate... echo... 02/2010   Mitral regurgitation    mild... echo.. 02/2010   PVC (premature ventricular contraction)    Renal calculus    Sinus bradycardia    Tubular adenoma of colon 08/2009   Past Surgical History:  Procedure Laterality Date   COLONOSCOPY     COLONOSCOPY  WITH PROPOFOL N/A 12/22/2019   Procedure: COLONOSCOPY WITH PROPOFOL;  Surgeon: Jackquline Denmark, MD;  Location: [facility];  Service: Endoscopy;  Laterality: N/A;   HEMOSTASIS CLIP PLACEMENT  12/22/2019   Procedure: HEMOSTASIS CLIP PLACEMENT;  Surgeon: Jackquline Denmark, MD;  Location: [facility];  Service: Endoscopy;;   KIDNEY STONE SURGERY     POLYPECTOMY     Family History  Problem Relation Age of Onset   Hypertension Mother    Stroke Mother 30       lived to 74   Other Father        parkinsons ? 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 [name]. Son and daughter in 15s in 2018. 79 59 year old grandchild.  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 Active with parkinsons exercise class- boxing and cycling   Selling home and moving into a retirement faciltiy   Social Determinants of Health   Financial Resource Strain: Low Risk    Difficulty of Paying Living Expenses: Not hard at all  Food Insecurity: No Food Insecurity   Worried About Charity fundraiser in the Last Year: Never true   Arboriculturist in the Last Year: Never true  Transportation Needs: No Transportation Needs   Lack of Transportation (Medical): No   Lack of Transportation (Non-Medical): No  Physical Activity: Inactive   Days of Exercise per Week: 0 days   Minutes of Exercise per Session: 0 min  Stress: No Stress Concern Present   Feeling of Stress : Not at all  Social Connections: Moderately Integrated   Frequency of Communication with Friends and Family: Never   Frequency of Social Gatherings with Friends and Family: Three times a week   Attends Religious Services: More than 4 times per year   Active Member of Clubs or Organizations: No   Attends Archivist Meetings: Never   Marital Status: Married    Tobacco  Counseling Counseling given: Not Answered   Clinical Intake:  Pre-visit preparation completed: Yes  Pain : No/denies pain     BMI - recorded: 25.44 Nutritional Status: BMI 25 -29 Overweight Nutritional Risks: None Diabetes: No  How often do you need to have someone help you when you read instructions, pamphlets, or other written materials from your doctor or pharmacy?: 1 - Never  Diabetic?no  Interpreter Needed?: No  Information entered by :: Charlott Rakes, LPN  Activities of Daily Living In your present state of health, do you have any difficulty performing the following activities: 10/27/2021 01/10/2021  Hearing? N N  Vision? N N  Difficulty concentrating or making decisions? Tempie Donning  Walking or climbing stairs? Y N  Dressing or bathing? N Y  Comment assistance as needed from wife -  Doing errands, shopping? N Y  Conservation officer, nature and eating ? Y -  Comment prepare meals at times -  Using the Toilet? N -  In the past six months, have you accidently leaked urine? N -  Do you have problems with loss of bowel control? N -  Managing your Medications? N -  Managing your Finances? N -  Comment wife assist -  Housekeeping or managing your Housekeeping? N -  Comment wife assist -  Some recent data might be hidden    Patient Care Team: [name], MD as PCP - General (Family Medicine) Tat, Eustace Quail, DO as Consulting Physician (Neurology) [facility]  Indicate any recent Medical Services you may have received from other than [facility] providers in the past year (date may be approximate).     Assessment:   This is a routine wellness examination for [place].  Hearing/Vision screen Hearing Screening - Comments:: Pt denies any hearing issues  Vision Screening - Comments:: Pt follows up with Dr Diona Foley for bi annual eye exams   Dietary issues and exercise activities discussed: Current Exercise Habits: The patient does not participate in regular  exercise at present   Goals Addressed             This Visit's Progress    Patient Stated       Get out a little more for walking        Depression Screen PHQ 2/9 Scores 10/27/2021 06/16/2021 01/10/2021 04/05/2020 09/29/2019 02/11/2019 12/22/2018  PHQ - 2 Score 0 3 0 0 0 0 0  PHQ- 9 Score - 6 3 0 0 1 -    Fall Risk Fall Risk  10/27/2021 09/15/2021 06/16/2021 04/04/2021 01/10/2021  Falls in the past year? 0 0 0 0 0  Number falls in past yr: 0 0 0 0 0  Injury with Fall? 0 0 0 0 0  Risk for fall due to : Impaired balance/gait - No Fall Risks - -  Follow up Falls prevention discussed - Falls evaluation completed - -    FALL RISK PREVENTION PERTAINING TO THE HOME:  Any stairs in or around the home? No  If so, are there any without handrails? No  Home free of loose throw rugs in walkways, pet beds, electrical cords, etc? Yes  Adequate lighting in your home to reduce risk of falls? Yes   ASSISTIVE DEVICES UTILIZED TO PREVENT FALLS:  Life alert? Yes  Use of a cane, walker or w/c? No  Grab bars in the bathroom? Yes  Shower chair or bench in shower? Yes  Elevated toilet seat or a handicapped toilet? Yes   TIMED UP AND GO:  Was the test performed? Yes .  Length of time to ambulate 10 feet: 15 sec.   Gait slow and steady without use of assistive device  Cognitive Function: MMSE - Mini Mental State Exam 05/23/2018 05/14/2017 01/08/2017 12/07/2016  Not completed: Refused Unable to complete Unable to complete (No Data)   Montreal Cognitive Assessment  01/09/2019  Visuospatial/ Executive (0/5) 1  Naming (0/3) 3  Attention: Read list of digits (0/2) 0  Attention: Read  list of letters (0/1) 0  Attention: Serial 7 subtraction starting at 100 (0/3) 2  Language: Repeat phrase (0/2) 0  Language : Fluency (0/1) 0  Abstraction (0/2) 1  Delayed Recall (0/5) 0  Orientation (0/6) 3  Total 10   6CIT Screen 10/27/2021  What Year? 4 points  What month? 3 points  What time? 3 points  Count back  from 20 4 points  Months in reverse 4 points  Repeat phrase 10 points  Total Score 28    Immunizations Immunization History  Administered Date(s) Administered   Fluad Quad(high Dose 65+) 06/10/2020, 06/12/2021   Influenza Split 06/24/2011, 06/03/2012, 06/08/2013, 07/12/2014   Influenza Whole 10/23/2009, 06/28/2010   Influenza, High Dose Seasonal PF 07/09/2016, 06/20/2017, 05/20/2018, 06/06/2019   Influenza-Unspecified 07/06/2015   PFIZER(Purple Top)SARS-COV-2 Vaccination 09/22/2019, 10/13/2019, 07/04/2020   Pneumococcal Conjugate-13 07/19/2014   Pneumococcal Polysaccharide-23 12/07/2016   Tdap 09/03/2009, 01/02/2012   Zoster Recombinat (Shingrix) 02/03/2018, 06/13/2018    TDAP status: Up to date  Flu Vaccine status: Up to date  Pneumococcal vaccine status: Up to date  Covid-19 vaccine status: Completed vaccines  Qualifies for Shingles Vaccine? Yes   Zostavax completed Yes   Shingrix Completed?: Yes  Screening Tests Health Maintenance  Topic Date Due   COVID-19 Vaccine (4 - Booster for Pfizer series) 08/29/2020   TETANUS/TDAP  01/01/2022   Pneumonia Vaccine 31+ Years old  Completed   INFLUENZA VACCINE  Completed   Zoster Vaccines- Shingrix  Completed   HPV VACCINES  Aged Out   COLONOSCOPY (Pts 45-41yrs Insurance coverage will need to be confirmed)  [place] Maintenance Due  Topic Date Due   COVID-19 Vaccine (4 - Booster for [place] series) 08/29/2020    Colorectal cancer screening: No longer required.   Additional Screening:  Vision Screening: Recommended annual ophthalmology exams for early detection of glaucoma and other disorders of the eye. Is the patient up to date with their annual eye exam?  Yes  Who is the provider or what is the name of the office in which the patient attends annual eye exams? Dr Diona Foley  If pt is not established with a provider, would they like to be referred to a provider to establish care? No .    Dental Screening: Recommended annual dental exams for proper oral hygiene  Community Resource Referral / Chronic Care Management: CRR required this visit?  No   CCM required this visit?  No      Plan:     I have personally reviewed and noted the following in the patients chart:   Medical and social history Use of alcohol, tobacco or illicit drugs  Current medications and supplements including opioid prescriptions. Patient is not currently taking opioid prescriptions. Functional ability and status Nutritional status Physical activity Advanced directives List of other physicians Hospitalizations, surgeries, and ER visits in previous 12 months Vitals Screenings to include cognitive, depression, and falls Referrals and appointments  In addition, I have reviewed and discussed with patient certain preventive protocols, quality metrics, and best practice recommendations. A written personalized care plan for preventive services as well as general preventive health recommendations were provided to patient.     Willette Brace, LPN   10/25/9796   Nurse Notes: None

## 2021-10-27 NOTE — Patient Instructions (Signed)
[name] , Thank you for taking time to come for your Medicare Wellness Visit. I appreciate your ongoing commitment to your health goals. Please review the following plan we discussed and let me know if I can assist you in the future.   Screening recommendations/referrals: Colonoscopy: no longer required  Recommended yearly ophthalmology/optometry visit for glaucoma screening and checkup Recommended yearly dental visit for hygiene and checkup  Vaccinations: Influenza vaccine: Done 06/12/21 repeat every year  Pneumococcal vaccine: Up to date Tdap vaccine: Done 01/02/12 due 01/01/22  Shingles vaccine: Completed 6/3, 06/13/18   Covid-19: Completed 1/19, 2/9, 07/04/20  Advanced directives: Copies in chart   Conditions/risks identified: none at this time   Next appointment: Follow up in one year for your annual wellness visit.   Preventive Care 35 Years and Older, Male Preventive care refers to lifestyle choices and visits with your health care provider that can promote health and wellness. What does preventive care include? A yearly physical exam. This is also called an annual well check. Dental exams once or twice a year. Routine eye exams. Ask your health care provider how often you should have your eyes checked. Personal lifestyle choices, including: Daily care of your teeth and gums. Regular physical activity. Eating a healthy diet. Avoiding tobacco and drug use. Limiting alcohol use. Practicing safe sex. Taking low doses of aspirin every day. Taking vitamin and mineral supplements as recommended by your health care provider. What happens during an annual well check? The services and screenings done by your health care provider during your annual well check will depend on your age, overall health, lifestyle risk factors, and family history of disease. Counseling  Your health care provider may ask you questions about your: Alcohol use. Tobacco use. Drug use. Emotional  well-being. Home and relationship well-being. Sexual activity. Eating habits. History of falls. Memory and ability to understand (cognition). Work and work Statistician. Screening  You may have the following tests or measurements: Height, weight, and BMI. Blood pressure. Lipid and cholesterol levels. These may be checked every 5 years, or more frequently if you are over 27 years old. Skin check. Lung cancer screening. You may have this screening every year starting at age 48 if you have a 30-pack-year history of smoking and currently smoke or have quit within the past 15 years. Fecal occult blood test (FOBT) of the stool. You may have this test every year starting at age 41. Flexible sigmoidoscopy or colonoscopy. You may have a sigmoidoscopy every 5 years or a colonoscopy every 10 years starting at age 15. Prostate cancer screening. Recommendations will vary depending on your family history and other risks. Hepatitis C blood test. Hepatitis B blood test. Sexually transmitted disease (STD) testing. Diabetes screening. This is done by checking your blood sugar (glucose) after you have not eaten for a while (fasting). You may have this done every 1-3 years. Abdominal aortic aneurysm (AAA) screening. You may need this if you are a current or former smoker. Osteoporosis. You may be screened starting at age 78 if you are at high risk. Talk with your health care provider about your test results, treatment options, and if necessary, the need for more tests. Vaccines  Your health care provider may recommend certain vaccines, such as: Influenza vaccine. This is recommended every year. Tetanus, diphtheria, and acellular pertussis (Tdap, Td) vaccine. You may need a Td booster every 10 years. Zoster vaccine. You may need this after age 76. Pneumococcal 13-valent conjugate (PCV13) vaccine. One dose is recommended  after age 48. Pneumococcal polysaccharide (PPSV23) vaccine. One dose is recommended after  age 71. Talk to your health care provider about which screenings and vaccines you need and how often you need them. This information is not intended to replace advice given to you by your health care provider. Make sure you discuss any questions you have with your health care provider. Document Released: 09/16/2015 Document Revised: 05/09/2016 Document Reviewed: 06/21/2015 Elsevier Interactive Patient Education  2017 [place] Prevention in the Home Falls can cause injuries. They can happen to people of all ages. There are many things you can do to make your home safe and to help prevent falls. What can I do on the outside of my home? Regularly fix the edges of walkways and driveways and fix any cracks. Remove anything that might make you trip as you walk through a door, such as a raised step or threshold. Trim any bushes or trees on the path to your home. Use bright outdoor lighting. Clear any walking paths of anything that might make someone trip, such as rocks or tools. Regularly check to see if handrails are loose or broken. Make sure that both sides of any steps have handrails. Any raised decks and porches should have guardrails on the edges. Have any leaves, snow, or ice cleared regularly. Use sand or salt on walking paths during winter. Clean up any spills in your garage right away. This includes oil or grease spills. What can I do in the bathroom? Use night lights. Install grab bars by the toilet and in the tub and shower. Do not use towel bars as grab bars. Use non-skid mats or decals in the tub or shower. If you need to sit down in the shower, use a plastic, non-slip stool. Keep the floor dry. Clean up any water that spills on the floor as soon as it happens. Remove soap buildup in the tub or shower regularly. Attach bath mats securely with double-sided non-slip rug tape. Do not have throw rugs and other things on the floor that can make you trip. What can I do in the  bedroom? Use night lights. Make sure that you have a light by your bed that is easy to reach. Do not use any sheets or blankets that are too big for your bed. They should not hang down onto the floor. Have a firm chair that has side arms. You can use this for support while you get dressed. Do not have throw rugs and other things on the floor that can make you trip. What can I do in the kitchen? Clean up any spills right away. Avoid walking on wet floors. Keep items that you use a lot in easy-to-reach places. If you need to reach something above you, use a strong step stool that has a grab bar. Keep electrical cords out of the way. Do not use floor polish or wax that makes floors slippery. If you must use wax, use non-skid floor wax. Do not have throw rugs and other things on the floor that can make you trip. What can I do with my stairs? Do not leave any items on the stairs. Make sure that there are handrails on both sides of the stairs and use them. Fix handrails that are broken or loose. Make sure that handrails are as long as the stairways. Check any carpeting to make sure that it is firmly attached to the stairs. Fix any carpet that is loose or worn. Avoid having throw  rugs at the top or bottom of the stairs. If you do have throw rugs, attach them to the floor with carpet tape. Make sure that you have a light switch at the top of the stairs and the bottom of the stairs. If you do not have them, ask someone to add them for you. What else can I do to help prevent falls? Wear shoes that: Do not have high heels. Have rubber bottoms. Are comfortable and fit you well. Are closed at the toe. Do not wear sandals. If you use a stepladder: Make sure that it is fully opened. Do not climb a closed stepladder. Make sure that both sides of the stepladder are locked into place. Ask someone to hold it for you, if possible. Clearly mark and make sure that you can see: Any grab bars or  handrails. First and last steps. Where the edge of each step is. Use tools that help you move around (mobility aids) if they are needed. These include: Canes. Walkers. Scooters. Crutches. Turn on the lights when you go into a dark area. Replace any light bulbs as soon as they burn out. Set up your furniture so you have a clear path. Avoid moving your furniture around. If any of your floors are uneven, fix them. If there are any pets around you, be aware of where they are. Review your medicines with your doctor. Some medicines can make you feel dizzy. This can increase your chance of falling. Ask your doctor what other things that you can do to help prevent falls. This information is not intended to replace advice given to you by your health care provider. Make sure you discuss any questions you have with your health care provider. Document Released: 06/16/2009 Document Revised: 01/26/2016 Document Reviewed: 09/24/2014 Elsevier Interactive Patient Education  2017 [employer].

## 2021-10-30 ENCOUNTER — Other Ambulatory Visit: Payer: Self-pay | Admitting: Neurology

## 2021-10-30 DIAGNOSIS — G20 Parkinson's disease: Secondary | ICD-10-CM

## 2021-10-31 ENCOUNTER — Other Ambulatory Visit: Payer: Self-pay

## 2021-11-13 NOTE — Progress Notes (Unsigned)
This encounter was created in error - please disregard.  This encounter was created in error - please disregard.

## 2021-12-14 ENCOUNTER — Other Ambulatory Visit: Payer: Self-pay | Admitting: Neurology

## 2021-12-14 DIAGNOSIS — G20 Parkinson's disease: Secondary | ICD-10-CM

## 2021-12-14 DIAGNOSIS — F0280 Dementia in other diseases classified elsewhere without behavioral disturbance: Secondary | ICD-10-CM

## 2021-12-14 DIAGNOSIS — G20A1 Parkinson's disease without dyskinesia, without mention of fluctuations: Secondary | ICD-10-CM

## 2022-01-02 DIAGNOSIS — L570 Actinic keratosis: Secondary | ICD-10-CM | POA: Diagnosis not present

## 2022-01-02 DIAGNOSIS — L219 Seborrheic dermatitis, unspecified: Secondary | ICD-10-CM | POA: Diagnosis not present

## 2022-01-02 DIAGNOSIS — Z08 Encounter for follow-up examination after completed treatment for malignant neoplasm: Secondary | ICD-10-CM | POA: Diagnosis not present

## 2022-01-02 DIAGNOSIS — L820 Inflamed seborrheic keratosis: Secondary | ICD-10-CM | POA: Diagnosis not present

## 2022-01-02 DIAGNOSIS — L578 Other skin changes due to chronic exposure to nonionizing radiation: Secondary | ICD-10-CM | POA: Diagnosis not present

## 2022-01-02 DIAGNOSIS — Z85828 Personal history of other malignant neoplasm of skin: Secondary | ICD-10-CM | POA: Diagnosis not present

## 2022-01-02 DIAGNOSIS — L821 Other seborrheic keratosis: Secondary | ICD-10-CM | POA: Diagnosis not present

## 2022-01-02 DIAGNOSIS — D1801 Hemangioma of skin and subcutaneous tissue: Secondary | ICD-10-CM | POA: Diagnosis not present

## 2022-01-17 NOTE — Progress Notes (Signed)
Assessment/Plan:   1.  Parkinsons Disease  -Continue carbidopa/levodopa 25/100, 2 tablets 3 times per day.  Can take extra 1/2 tablet prn  -Continue carbidopa/levodopa 50/200 at bedtime  -discussed getting into exercise once he moves.   2.  PDD with Hallucinations  -On donepezil, 10 mg daily.  -Patient currently taking quetiapine, 25 mg, 1 tablet at night.  Understand black box warning.  They expressed clearly that quality of life is a more important than quantity, and actually asked me to fill out DNR today.  We did discuss that quetiapine can sometimes cause hypokalemia, although he is on a very small dosage for this.  However, he has had hypokalemia recently.  They are having this checked by primary care.  His most recent potassium was back into the range of normal, without quetiapine dose changing.  If that does go back down, we can certainly consider changing quetiapine again.  -DNR forms filled out  -They asked about donating his brain after death to [place] and I will have my social worker start looking into that.   3.  GAD/depression  -going to stop lexapro.  Not sure its helping and on other QT prolonging agents  4.  A-fib  -Follows with cardiology.  On Eliquis.  Subjective:   Carlos [name] was seen today in follow up for Parkinsons disease.  Patient with wife who supplements history.  Daughter, Binnie Kand,  on the phone listening to visit.  My previous records were reviewed prior to todays visit as well as outside records available to me.  Patient and daughter with wife who supplements history.    Patient started on quetiapine last visit.  They had called me back and stated that it did not seem to be helping at low-dose (12.5 mg at night) and he was still having nighttime hallucinations and trouble sleeping.  I told him to increase it to 25 mg at bedtime.  They called [place] around Christmas time, so there was a day or 2 before we were able to get back to them and his wife  had just held the medication and she felt that he did better.  I told them that they could stay off it if they wanted, but felt that that was likely just coincidence since he previously did terrible when he was not on the medication.  They report today that they decided to continue with the medication.  Patient was in the hospital in early April with hypokalemia.  However, he was seen at the [facility], so there are very limited records available.  I can see that his potassium was 2.9 on arrival, which was apparently just discovered on routine blood work by primary care, and then he was sent to the hospital for evaluation.  They just had it rechecked and were told it was normal.    Current prescribed movement disorder medications: Carbidopa/levodopa 25/100, 2 at 7am, 1/2 at 9:30am, 2 at 11am, 1/2 at 1:30, 2 at 4pm (they have started this dosing schedule) Carbidopa/levodopa 50/200 at bed Donepezil Lexapro, 10 mg Quetiapine, 25 mg at bedtime and another 12.5 mg in the daytime if needed (taking 1 at bed)  ALLERGIES:  No Known Allergies  CURRENT MEDICATIONS:  Outpatient Encounter Medications as of 01/18/2022  Medication Sig   apixaban (ELIQUIS) 5 MG TABS tablet Take 1 tablet (5 mg total) by mouth 2 (two) times daily.   atenolol (TENORMIN) 25 MG tablet TAKE 1 TABLET BY MOUTH DAILY   carbidopa-levodopa (  SINEMET CR) 50-200 MG tablet TAKE 1 TABLET BY MOUTH EVERY NIGHT AT BEDTIME   carbidopa-levodopa (SINEMET IR) 25-100 MG tablet TAKE 2 TABLETS BY MOUTH THREE TIMES DAILY AND EXTRA 1/2 TABLET AS NEEDED   carboxymethylcellulose (REFRESH PLUS) 0.5 % SOLN INSTILL 1 DROP IN BOTH EYES FOUR TIMES A DAY   donepezil (ARICEPT) 10 MG tablet TAKE 1 TABLET BY MOUTH AT BEDTIME   escitalopram (LEXAPRO) 10 MG tablet TAKE 1 TABLET BY MOUTH DAILY   FLUZONE HIGH-DOSE QUADRIVALENT 0.7 ML SUSY    iron polysaccharides (NIFEREX) 150 MG capsule TAKE 1 CAPSULE(150 MG) BY MOUTH DAILY (Patient taking differently: Take once  a week)   QUEtiapine (SEROQUEL) 25 MG tablet 1 tablet at bedtime, may given another 1/2 in the day prn   simvastatin (ZOCOR) 10 MG tablet Take 1 tablet (10 mg total) by mouth at bedtime.   No facility-administered encounter medications on file as of 01/18/2022.    Objective:   PHYSICAL EXAMINATION:    VITALS:   Vitals:   01/18/22 1114  BP: 119/78  Pulse: 74  SpO2: 95%  Weight: 189 lb (85.7 kg)  Height: 6' (1.829 m)       GEN:  The patient appears stated age and is in NAD. HEENT:  Normocephalic, atraumatic.  The mucous membranes are moist. The superficial temporal arteries are without ropiness or tenderness. CV:  RRR Lungs:  CTAB Neck/HEME:  There are no carotid bruits bilaterally.  Neurological examination:  Orientation: The patient is alert and oriented to person/place.  He is interactive and follows appropriate commands.  He looks to wife for finer aspects of the history.  He does not know the age of his daughter, although he does know who she is. Cranial nerves: There is good facial symmetry with facial hypomimia. The speech is fluent and clear. Minimal dysphasia.  Soft palate rises symmetrically and there is no tongue deviation. Hearing is intact to conversational tone. Sensation: Sensation is intact to light touch throughout Motor: Strength is at least antigravity x4.  Movement examination:  Tone: There is normal tone in the UE/LE Abnormal movements: there is mild, near constant RUE rest tremor Coordination:  There is mild decremation with RAM's Gait and Station: The patient has no difficulty arising out of a deep-seated chair without the use of the hands. The patient's stride length is good today and he walks well in the hall  I have reviewed and interpreted the following labs independently    Chemistry      Component Value Date/Time   NA 140 01/10/2021 1159   NA 137 05/13/2020 0000   K 3.7 01/10/2021 1159   CL 100 01/10/2021 1159   CO2 35 (H) 01/10/2021 1159    BUN 19 01/10/2021 1159   BUN 22 (A) 05/13/2020 0000   CREATININE 1.06 01/10/2021 1159   CREATININE 1.02 04/05/2020 1212   GLU 93 05/13/2020 0000      Component Value Date/Time   CALCIUM 9.7 01/10/2021 1159   ALKPHOS 68 01/10/2021 1159   AST 19 01/10/2021 1159   ALT 9 01/10/2021 1159   BILITOT 2.1 (H) 01/10/2021 1159       Lab Results  Component Value Date   WBC 6.1 01/10/2021   HGB 16.6 01/10/2021   HCT 48.9 01/10/2021   MCV 91.5 01/10/2021   PLT 115.0 (L) 01/10/2021    Lab Results  Component Value Date   TSH 1.72 05/13/2020     Total time spent on today's visit was 42  minutes, including both face-to-face time and nonface-to-face time.  Time included that spent on review of records (prior notes available to me/labs/imaging if pertinent), discussing treatment and goals, answering patient's questions and coordinating care.  Cc:  [name], MD

## 2022-01-18 ENCOUNTER — Encounter: Payer: Self-pay | Admitting: Neurology

## 2022-01-18 ENCOUNTER — Ambulatory Visit: Payer: Medicare Other | Admitting: Neurology

## 2022-01-18 DIAGNOSIS — F02818 Dementia in other diseases classified elsewhere, unspecified severity, with other behavioral disturbance: Secondary | ICD-10-CM | POA: Diagnosis not present

## 2022-01-18 DIAGNOSIS — F0280 Dementia in other diseases classified elsewhere without behavioral disturbance: Secondary | ICD-10-CM | POA: Diagnosis not present

## 2022-01-18 DIAGNOSIS — R4182 Altered mental status, unspecified: Secondary | ICD-10-CM | POA: Diagnosis not present

## 2022-01-18 DIAGNOSIS — G20 Parkinson's disease: Secondary | ICD-10-CM | POA: Diagnosis not present

## 2022-01-18 MED ORDER — CARBIDOPA-LEVODOPA 25-100 MG PO TABS: ORAL_TABLET | ORAL | 0 refills | Status: DC

## 2022-01-18 MED ORDER — QUETIAPINE FUMARATE 25 MG PO TABS: ORAL_TABLET | ORAL | 1 refills | Status: DC

## 2022-01-18 MED ORDER — DONEPEZIL HCL 10 MG PO TABS: ORAL_TABLET | ORAL | 1 refills | Status: DC

## 2022-01-18 NOTE — Patient Instructions (Signed)
DECREASE lexapro to 5 mg daily for 3 weeks and then STOP the Lexapro

## 2022-01-30 ENCOUNTER — Other Ambulatory Visit: Payer: Self-pay | Admitting: Family Medicine

## 2022-01-31 ENCOUNTER — Other Ambulatory Visit: Payer: Self-pay | Admitting: Family Medicine

## 2022-02-01 DIAGNOSIS — G20 Parkinson's disease: Secondary | ICD-10-CM | POA: Diagnosis not present

## 2022-02-01 DIAGNOSIS — R262 Difficulty in walking, not elsewhere classified: Secondary | ICD-10-CM | POA: Diagnosis not present

## 2022-02-02 ENCOUNTER — Telehealth: Payer: Self-pay | Admitting: Licensed Clinical Social Worker

## 2022-02-02 NOTE — Telephone Encounter (Signed)
Pt was seen in the clinic and was inquiring about brain donation.  Family resides in [place] followed up with daughter Carlos [name] who is HCPOA on 5/18 with message and with e-mail .  LCSW provided the needed paperwork to register with [facility] and with the [employer]  which is needed prior to passing and also paperwork on what to expect at time of passing . Family is aware to contact LCSW if further questions however does have appropriate contact information on paperwork if needed to assist as well.

## 2022-02-05 DIAGNOSIS — Z0279 Encounter for issue of other medical certificate: Secondary | ICD-10-CM

## 2022-02-06 DIAGNOSIS — G20 Parkinson's disease: Secondary | ICD-10-CM | POA: Diagnosis not present

## 2022-02-06 DIAGNOSIS — R262 Difficulty in walking, not elsewhere classified: Secondary | ICD-10-CM | POA: Diagnosis not present

## 2022-02-08 ENCOUNTER — Other Ambulatory Visit: Payer: Self-pay | Admitting: Neurology

## 2022-02-08 DIAGNOSIS — G20 Parkinson's disease: Secondary | ICD-10-CM

## 2022-02-08 DIAGNOSIS — R262 Difficulty in walking, not elsewhere classified: Secondary | ICD-10-CM | POA: Diagnosis not present

## 2022-02-08 DIAGNOSIS — F411 Generalized anxiety disorder: Secondary | ICD-10-CM

## 2022-02-08 DIAGNOSIS — F330 Major depressive disorder, recurrent, mild: Secondary | ICD-10-CM

## 2022-02-08 DIAGNOSIS — F0280 Dementia in other diseases classified elsewhere without behavioral disturbance: Secondary | ICD-10-CM

## 2022-02-13 DIAGNOSIS — R262 Difficulty in walking, not elsewhere classified: Secondary | ICD-10-CM | POA: Diagnosis not present

## 2022-02-13 DIAGNOSIS — G20 Parkinson's disease: Secondary | ICD-10-CM | POA: Diagnosis not present

## 2022-02-15 DIAGNOSIS — R262 Difficulty in walking, not elsewhere classified: Secondary | ICD-10-CM | POA: Diagnosis not present

## 2022-02-15 DIAGNOSIS — G20 Parkinson's disease: Secondary | ICD-10-CM | POA: Diagnosis not present

## 2022-02-20 DIAGNOSIS — G20 Parkinson's disease: Secondary | ICD-10-CM | POA: Diagnosis not present

## 2022-02-20 DIAGNOSIS — R262 Difficulty in walking, not elsewhere classified: Secondary | ICD-10-CM | POA: Diagnosis not present

## 2022-02-27 ENCOUNTER — Other Ambulatory Visit: Payer: Self-pay | Admitting: Family Medicine

## 2022-02-27 DIAGNOSIS — G20 Parkinson's disease: Secondary | ICD-10-CM | POA: Diagnosis not present

## 2022-02-27 DIAGNOSIS — R262 Difficulty in walking, not elsewhere classified: Secondary | ICD-10-CM | POA: Diagnosis not present

## 2022-03-01 DIAGNOSIS — G20 Parkinson's disease: Secondary | ICD-10-CM | POA: Diagnosis not present

## 2022-03-01 DIAGNOSIS — R262 Difficulty in walking, not elsewhere classified: Secondary | ICD-10-CM | POA: Diagnosis not present

## 2022-03-05 DIAGNOSIS — R262 Difficulty in walking, not elsewhere classified: Secondary | ICD-10-CM | POA: Diagnosis not present

## 2022-03-05 DIAGNOSIS — G20 Parkinson's disease: Secondary | ICD-10-CM | POA: Diagnosis not present

## 2022-03-08 DIAGNOSIS — R262 Difficulty in walking, not elsewhere classified: Secondary | ICD-10-CM | POA: Diagnosis not present

## 2022-03-08 DIAGNOSIS — G20 Parkinson's disease: Secondary | ICD-10-CM | POA: Diagnosis not present

## 2022-03-13 DIAGNOSIS — R262 Difficulty in walking, not elsewhere classified: Secondary | ICD-10-CM | POA: Diagnosis not present

## 2022-03-13 DIAGNOSIS — G20 Parkinson's disease: Secondary | ICD-10-CM | POA: Diagnosis not present

## 2022-03-15 DIAGNOSIS — R262 Difficulty in walking, not elsewhere classified: Secondary | ICD-10-CM | POA: Diagnosis not present

## 2022-03-15 DIAGNOSIS — G20 Parkinson's disease: Secondary | ICD-10-CM | POA: Diagnosis not present

## 2022-03-20 DIAGNOSIS — G20 Parkinson's disease: Secondary | ICD-10-CM | POA: Diagnosis not present

## 2022-03-20 DIAGNOSIS — R262 Difficulty in walking, not elsewhere classified: Secondary | ICD-10-CM | POA: Diagnosis not present

## 2022-03-22 DIAGNOSIS — R262 Difficulty in walking, not elsewhere classified: Secondary | ICD-10-CM | POA: Diagnosis not present

## 2022-03-22 DIAGNOSIS — G20 Parkinson's disease: Secondary | ICD-10-CM | POA: Diagnosis not present

## 2022-03-27 DIAGNOSIS — G20 Parkinson's disease: Secondary | ICD-10-CM | POA: Diagnosis not present

## 2022-03-27 DIAGNOSIS — R262 Difficulty in walking, not elsewhere classified: Secondary | ICD-10-CM | POA: Diagnosis not present

## 2022-03-30 DIAGNOSIS — G20 Parkinson's disease: Secondary | ICD-10-CM | POA: Diagnosis not present

## 2022-03-30 DIAGNOSIS — R262 Difficulty in walking, not elsewhere classified: Secondary | ICD-10-CM | POA: Diagnosis not present

## 2022-04-03 ENCOUNTER — Telehealth: Payer: Self-pay | Admitting: Neurology

## 2022-04-03 DIAGNOSIS — G20 Parkinson's disease: Secondary | ICD-10-CM | POA: Diagnosis not present

## 2022-04-03 DIAGNOSIS — R262 Difficulty in walking, not elsewhere classified: Secondary | ICD-10-CM | POA: Diagnosis not present

## 2022-04-03 NOTE — Telephone Encounter (Signed)
Patients wife called and stated she would like to discuss some issues that Terrace is having.  Please call back.

## 2022-04-03 NOTE — Telephone Encounter (Signed)
He is having a lot of anger and insecurity issues. She stated that he gets worked up when she isnt around, she has sitters so she can get thing done and he had started to get upset with the sitters as well. Pt wife is asking for some help.

## 2022-04-04 ENCOUNTER — Other Ambulatory Visit: Payer: Self-pay | Admitting: Neurology

## 2022-04-04 DIAGNOSIS — F0280 Dementia in other diseases classified elsewhere without behavioral disturbance: Secondary | ICD-10-CM

## 2022-04-04 DIAGNOSIS — G20 Parkinson's disease: Secondary | ICD-10-CM

## 2022-04-05 DIAGNOSIS — G20 Parkinson's disease: Secondary | ICD-10-CM | POA: Diagnosis not present

## 2022-04-05 DIAGNOSIS — R262 Difficulty in walking, not elsewhere classified: Secondary | ICD-10-CM | POA: Diagnosis not present

## 2022-04-10 ENCOUNTER — Ambulatory Visit: Payer: Medicare Other | Admitting: Physician Assistant

## 2022-04-10 ENCOUNTER — Encounter: Payer: Self-pay | Admitting: Physician Assistant

## 2022-04-10 VITALS — BP 121/82 | HR 82 | Resp 18 | Ht 74.0 in | Wt 192.0 lb

## 2022-04-10 DIAGNOSIS — F02818 Dementia in other diseases classified elsewhere, unspecified severity, with other behavioral disturbance: Secondary | ICD-10-CM | POA: Diagnosis not present

## 2022-04-10 DIAGNOSIS — G20 Parkinson's disease: Secondary | ICD-10-CM

## 2022-04-10 MED ORDER — DIVALPROEX SODIUM 125 MG PO DR TAB: 125.0000 mg | DELAYED_RELEASE_TABLET | Freq: Every evening | ORAL | 4 refills | Status: DC

## 2022-04-10 NOTE — Patient Instructions (Signed)
It was a pleasure to see you today at our office.   Recommendations:  Follow up with Dr. Carles Collet in Nov as schedule Start Depakote 125 mg at night for hallucinations and mood  Continue quetiapine 25 mg as directed  Continue with Parkinson meds   Whom to call:  Memory  decline, memory medications: Call our office [phone]   For psychiatric meds, mood meds: Please have your primary care physician manage these medications.   Counseling regarding caregiver distress, including caregiver depression, anxiety and issues regarding community resources, adult day care programs, adult living facilities, or memory care questions:   Feel free to contact [place], Social Worker at [phone]   For assessment of decision of mental capacity and competency:  Call Dr. Anthoney Harada, geriatric psychiatrist at [phone]  For guidance in geriatric dementia issues please call [facility] [phone]  For guidance regarding [facility] and if placement were needed at the facility, contact Arnell Asal, Social Worker tel: [phone]  If you have any severe symptoms of a stroke, or other severe issues such as confusion,severe chills or fever, etc call 911 or go to the ER as you may need to be evaluated further   Feel free to visit Facebook page " [email or web address]" for tips of how to care for people with memory problems.     RECOMMENDATIONS FOR ALL PATIENTS WITH MEMORY PROBLEMS: 1. Continue to exercise (Recommend 30 minutes of walking everyday, or 3 hours every week) 2. Increase social interactions - continue going to [place] and enjoy social gatherings with friends and family 3. Eat healthy, avoid fried foods and eat more fruits and vegetables 4. Maintain adequate blood pressure, blood sugar, and blood cholesterol level. Reducing the risk of stroke and cardiovascular disease also helps promoting better memory. 5. Avoid stressful situations. Live a simple life and  avoid aggravations. Organize your time and prepare for the next day in anticipation. 6. Sleep well, avoid any interruptions of sleep and avoid any distractions in the bedroom that may interfere with adequate sleep quality 7. Avoid sugar, avoid sweets as there is a strong link between excessive sugar intake, diabetes, and cognitive impairment We discussed the Mediterranean diet, which has been shown to help patients reduce the risk of progressive memory disorders and reduces cardiovascular risk. This includes eating fish, eat fruits and green leafy vegetables, nuts like almonds and hazelnuts, walnuts, and also use olive oil. Avoid fast foods and fried foods as much as possible. Avoid sweets and sugar as sugar use has been linked to worsening of memory function.  There is always a concern of gradual progression of memory problems. If this is the case, then we may need to adjust level of care according to patient needs. Support, both to the patient and caregiver, should then be put into place.    FALL PRECAUTIONS: Be cautious when walking. Scan the area for obstacles that may increase the risk of trips and falls. When getting up in the mornings, sit up at the edge of the bed for a few minutes before getting out of bed. Consider elevating the bed at the head end to avoid drop of blood pressure when getting up. Walk always in a well-lit room (use night lights in the walls). Avoid area rugs or power cords from appliances in the middle of the walkways. Use a walker or a cane if necessary and consider physical therapy for balance exercise. Get your eyesight checked regularly.  FINANCIAL OVERSIGHT: Supervision, especially oversight  when making financial decisions or transactions is also recommended.  HOME SAFETY: Consider the safety of the kitchen when operating appliances like stoves, microwave oven, and blender. Consider having supervision and share cooking responsibilities until no longer able to participate  in those. Accidents with firearms and other hazards in the house should be identified and addressed as well.   ABILITY TO BE LEFT ALONE: If patient is unable to contact 911 operator, consider using Life[facility], or when the need is there, arrange for someone to stay with patients. Smoking is a fire hazard, consider supervision or cessation. Risk of wandering should be assessed by caregiver and if detected at any point, supervision and safe proof recommendations should be instituted.  MEDICATION SUPERVISION: Inability to self-administer medication needs to be constantly addressed. Implement a mechanism to ensure safe administration of the medications.

## 2022-04-10 NOTE — Progress Notes (Addendum)
Assessment/Plan:    1.  Parkinsons Disease  -Continue carbidopa/levodopa 25/100, 2 tablets 3 times per day.  Can take extra 1/2 tablet prn  -Continue carbidopa/levodopa 50/200 at bedtime Increase activity level   2.  PDD with Hallucinations Continue donepezil daily Continue quetiapine 25 mg nightly, blackbox warning discussed.  They wish to continue with the medication.  Quetiapine can cause hypokalemia but he is on a very small dosage, this is being checked by primary care physician. Start Depakote 125 mg qhs for hallucinations and mood. Side effects discussed  DNR forms filled out Education officer, museum evaluating for utilizing his brain for Parkinson's research if applicable.       Follow-up with Dr. Carles Collet as scheduled   3.  GAD/depression Follow-up with PCP.  Lexapro discontinued   4.  A-fib  -Follows with cardiology.  On Eliquis.    Subjective:    This patient is accompanied in the office by his daughter and his wife who supplements the history.  Previous records as well as any outside records available were reviewed prior to todays visit.     Any changes in memory since last visit?  "Worse, but do not really have conversations ".  Family reports that he has a hard time recognizing who is in the family gathering. Patient lives with: Wife repeats oneself?  Endorsed Disoriented when walking into a room?  Endorsed, especially when trying to find a bathroom and he gets disoriented. Leaving objects in unusual places?  Patient denies   Ambulates  with difficulty?   Patient denies   Recent falls? Endorsed 2-3 times over the last couple months, not losing consciousness "he just had scratches ".  Any head injuries?  Patient denies   History of seizures?   Patient denies   Wandering behavior?  Patient denies , but if his wife goes to check mail, he will follow her, sometimes he walks to the complex, and does not know where he is going.  He needs increasing monitoring. Patient  drives?   Patient no longer drives  Any mood changes since last visit?  Endorsed.  He has a lot of anger and security issues.  He gets "worked up when she is not around".  She has brought sitters to help him and he began to get upset with the seizures as well. Any worsening depression?:  Patient denies   Hallucinations?  Patient denies   Paranoia?  Patient denies   Patient reports that he sleeps well without vivid dreams, REM behavior or sleepwalking   History of sleep apnea?  Patient denies   Any hygiene concerns?  Patient denies   Independent of bathing and dressing?  Endorsed  Does the patient needs help with medications?  Denies Who is in charge of the finances?  Wife is in charge    Any changes in appetite?  Patient denies   Patient have trouble swallowing? Patient denies   Does the patient cook?  Over the last 3 nights, he has some decreased appetite, eating "little bits " Any kitchen accidents such as leaving the stove on? Patient denies   Any headaches?  Patient denies   Double vision? Patient denies   Any focal numbness or tingling?  Patient denies   Chronic back pain Patient denies   Unilateral weakness?  Patient denies   Any tremors?  He has a history of Parkinson's disease, tremors are not worsening.  He takes carbidopa levodopa Any history of anosmia?  Patient denies  Any incontinence of urine?  Patient denies   Any bowel dysfunction?   Patient denies        PREVIOUS MEDICATIONS:   CURRENT MEDICATIONS:  Outpatient Encounter Medications as of 04/10/2022  Medication Sig   apixaban (ELIQUIS) 5 MG TABS tablet Take 1 tablet (5 mg total) by mouth 2 (two) times daily.   atenolol (TENORMIN) 25 MG tablet TAKE 1 TABLET BY MOUTH DAILY   carbidopa-levodopa (SINEMET CR) 50-200 MG tablet TAKE 1 TABLET BY MOUTH EVERY NIGHT AT BEDTIME   carbidopa-levodopa (SINEMET IR) 25-100 MG tablet TAKE 2 TABLETS BY MOUTH THREE TIMES DAILY AND EXTRA 1/2 TABLET AS NEEDED   carboxymethylcellulose  (REFRESH PLUS) 0.5 % SOLN INSTILL 1 DROP IN BOTH EYES FOUR TIMES A DAY   divalproex (DEPAKOTE) 125 MG DR tablet Take 1 tablet (125 mg total) by mouth at bedtime.   donepezil (ARICEPT) 10 MG tablet TAKE 1 TABLET BY MOUTH AT BEDTIME   FLUZONE HIGH-DOSE QUADRIVALENT 0.7 ML SUSY    iron polysaccharides (NIFEREX) 150 MG capsule TAKE 1 CAPSULE(150 MG) BY MOUTH DAILY (Patient taking differently: Take once a week)   QUEtiapine (SEROQUEL) 25 MG tablet 1 tablet at bedtime   simvastatin (ZOCOR) 10 MG tablet TAKE 1 TABLET(10 MG) BY MOUTH AT BEDTIME   No facility-administered encounter medications on file as of 04/10/2022.       05/23/2018   10:11 AM 05/14/2017    1:30 PM 01/08/2017    1:19 PM  MMSE - Mini Mental State Exam  Not completed: Refused Unable to complete Unable to complete      01/09/2019    2:00 PM  Montreal Cognitive Assessment   Visuospatial/ Executive (0/5) 1  Naming (0/3) 3  Attention: Read list of digits (0/2) 0  Attention: Read list of letters (0/1) 0  Attention: Serial 7 subtraction starting at 100 (0/3) 2  Language: Repeat phrase (0/2) 0  Language : Fluency (0/1) 0  Abstraction (0/2) 1  Delayed Recall (0/5) 0  Orientation (0/6) 3  Total 10    Objective:     PHYSICAL EXAMINATION:    VITALS:   Vitals:   04/10/22 1304  BP: 121/82  Pulse: 82  Resp: 18  SpO2: 96%  Weight: 192 lb (87.1 kg)  Height: '6\' 2"'$  (1.88 m)    GEN:  The patient appears stated age and is in NAD. HEENT:  Normocephalic, atraumatic.   Neurological examination:  General: NAD, well-groomed, appears stated age. Orientation: The patient is alert.  But looks at his wife for all aspects of answers. Cranial nerves: There is good facial symmetry.hypomimia noted.  The speech is fluent and clear.  Minimal dysarthria, no dysarthria. Fund of knowledge is reduced. Recent and remote memory are impaired. Attention and concentration are reduced.  Unable to name objects and repeat phrases.  Hearing is intact to  conversational tone.    Sensation: Sensation is intact to light touch throughout Motor: Strength is at least antigravity x4. Tremors: Mild, near constant right upper extremity rest tremor DTR's 2/4 in UE/LE    Movement examination: Tone: There is normal tone in the UE/LE Abnormal movements:  no tremor.  No myoclonus.  No asterixis.   Coordination:  There is mild decremation with RAM's. Normal finger to nose  Gait and Station: The patient has no difficulty arising out of a deep-seated chair without the use of the hands. The patient's stride length is good.  Gait is cautious and narrow.    Thank you for allowing  [place] the opportunity to participate in the care of this nice patient. Please do not hesitate to contact us for any questions or concerns.   Total time spent on today's visit was 40 minutes dedicated to this patient today, preparing to see patient, examining the patient, ordering tests and/or medications and counseling the patient, documenting clinical information in the EHR or other health record, independently interpreting results and communicating results to the patient/family, discussing treatment and goals, answering patient's questions and coordinating care.  Cc:  [name], MD  Sharene Butters 04/12/2022 11:08 AM

## 2022-04-12 DIAGNOSIS — G20 Parkinson's disease: Secondary | ICD-10-CM | POA: Diagnosis not present

## 2022-04-12 DIAGNOSIS — R262 Difficulty in walking, not elsewhere classified: Secondary | ICD-10-CM | POA: Diagnosis not present

## 2022-04-16 ENCOUNTER — Telehealth: Payer: Self-pay | Admitting: Physician Assistant

## 2022-04-16 NOTE — Telephone Encounter (Signed)
Patients wife called and stated she wanted to talk to someone about the medicine that Harun was prescribed at his appt last week.

## 2022-04-17 NOTE — Telephone Encounter (Signed)
Carlos [name] returned call to [place].

## 2022-04-17 NOTE — Telephone Encounter (Signed)
The depakote was the medication not Quetiapine per wife, hallucinations were better with Depakote, now developed nausea. Please advise on medication and clarify meds to continue in the am 04/17/2022. (Off balance per wife).

## 2022-04-17 NOTE — Telephone Encounter (Signed)
Left message to call office at 10:54am

## 2022-04-18 DIAGNOSIS — G20 Parkinson's disease: Secondary | ICD-10-CM | POA: Diagnosis not present

## 2022-04-18 DIAGNOSIS — R262 Difficulty in walking, not elsewhere classified: Secondary | ICD-10-CM | POA: Diagnosis not present

## 2022-04-18 NOTE — Telephone Encounter (Signed)
Advised to wife, will call with update in a few weeks, thanked me for calling.

## 2022-04-19 DIAGNOSIS — R262 Difficulty in walking, not elsewhere classified: Secondary | ICD-10-CM | POA: Diagnosis not present

## 2022-04-19 DIAGNOSIS — G20 Parkinson's disease: Secondary | ICD-10-CM | POA: Diagnosis not present

## 2022-04-24 DIAGNOSIS — R262 Difficulty in walking, not elsewhere classified: Secondary | ICD-10-CM | POA: Diagnosis not present

## 2022-04-24 DIAGNOSIS — G20 Parkinson's disease: Secondary | ICD-10-CM | POA: Diagnosis not present

## 2022-04-27 DIAGNOSIS — R262 Difficulty in walking, not elsewhere classified: Secondary | ICD-10-CM | POA: Diagnosis not present

## 2022-04-27 DIAGNOSIS — G20 Parkinson's disease: Secondary | ICD-10-CM | POA: Diagnosis not present

## 2022-04-30 ENCOUNTER — Other Ambulatory Visit: Payer: Self-pay | Admitting: Neurology

## 2022-04-30 DIAGNOSIS — G20 Parkinson's disease: Secondary | ICD-10-CM

## 2022-04-30 DIAGNOSIS — G20A1 Parkinson's disease without dyskinesia, without mention of fluctuations: Secondary | ICD-10-CM

## 2022-05-01 ENCOUNTER — Other Ambulatory Visit: Payer: Self-pay

## 2022-05-01 DIAGNOSIS — G20 Parkinson's disease: Secondary | ICD-10-CM

## 2022-05-01 MED ORDER — CARBIDOPA-LEVODOPA ER 50-200 MG PO TBCR: 1.0000 | EXTENDED_RELEASE_TABLET | Freq: Every day | ORAL | 1 refills | Status: DC

## 2022-05-02 DIAGNOSIS — G20 Parkinson's disease: Secondary | ICD-10-CM | POA: Diagnosis not present

## 2022-05-02 DIAGNOSIS — R262 Difficulty in walking, not elsewhere classified: Secondary | ICD-10-CM | POA: Diagnosis not present

## 2022-05-03 DIAGNOSIS — G20 Parkinson's disease: Secondary | ICD-10-CM | POA: Diagnosis not present

## 2022-05-03 DIAGNOSIS — R262 Difficulty in walking, not elsewhere classified: Secondary | ICD-10-CM | POA: Diagnosis not present

## 2022-05-08 ENCOUNTER — Other Ambulatory Visit: Payer: Self-pay | Admitting: Neurology

## 2022-05-08 DIAGNOSIS — F02818 Dementia in other diseases classified elsewhere, unspecified severity, with other behavioral disturbance: Secondary | ICD-10-CM

## 2022-05-08 DIAGNOSIS — R4182 Altered mental status, unspecified: Secondary | ICD-10-CM

## 2022-05-08 DIAGNOSIS — G20 Parkinson's disease: Secondary | ICD-10-CM | POA: Diagnosis not present

## 2022-05-08 DIAGNOSIS — R262 Difficulty in walking, not elsewhere classified: Secondary | ICD-10-CM | POA: Diagnosis not present

## 2022-05-10 DIAGNOSIS — G20 Parkinson's disease: Secondary | ICD-10-CM | POA: Diagnosis not present

## 2022-05-10 DIAGNOSIS — R262 Difficulty in walking, not elsewhere classified: Secondary | ICD-10-CM | POA: Diagnosis not present

## 2022-05-28 ENCOUNTER — Encounter: Payer: Self-pay | Admitting: *Deleted

## 2022-05-29 DIAGNOSIS — G20 Parkinson's disease: Secondary | ICD-10-CM | POA: Diagnosis not present

## 2022-05-29 DIAGNOSIS — R262 Difficulty in walking, not elsewhere classified: Secondary | ICD-10-CM | POA: Diagnosis not present

## 2022-07-18 NOTE — Progress Notes (Signed)
Assessment/Plan:   1.  Parkinsons Disease  -Continue carbidopa/levodopa 25/100, 2 tablets 3 times per day.  Can take extra 1/2 tablet prn  -Continue carbidopa/levodopa 50/200 at bedtime  -discussed getting into exercise once he3 moves.   2.  PDD with Hallucinations and behavioral disturbance  -On donepezil, 10 mg daily.  Discussed that this medication may be responsible for some of the loose stools/diarrhea.  They opted to continue it for now.  If it becomes a problem, they are to let me know and we will discontinue it.  -increase quetiapine, 25 mg, 1/2 in the AM, 1 at night.  If no help, we will change to nuplazid.    -DNR forms have been completed  -Continue Depakote, 125 mg at bed.  This can certainly be increased.  -Long discussion with patient/wife/daughter about level of care.  Ultimately, the caregiving burden has really grown, despite good family support and caregivers in the home a number of days per week.  It is probably time for them to consider skilled memory care.  No one here disagrees and this was a very tough conversation.  The family is going to discuss it more, but they are all leaning toward memory care.  3.  A-fib  -Follows with cardiology.  On Eliquis.  4.  B12 deficiency  -pt with evidence of B12 deficiency.  Discussed with the patient that neurologically, would like to see B12 levels greater than 400.  Would recommend oral B12 supplementation, 1000 mcg daily.   Subjective:   Carlos [name] was seen today in follow up for Parkinsons disease.  Patient with wife who supplements history.   My previous records were reviewed prior to todays visit as well as outside records available to me.  Patient and daughter with wife who supplements history.   Lexapro stopped last visit.  He did well with that.  He saw Clarise Cruz since our last visit.  Depakote was added. Its helping.  There are more nights he rests well than not.  It does appear that patient has an emergency room at the  [facility] on August 25.  I cannot see any of the notes.  I can see some of the labs.  I can see that his B12 level was checked and was only 331.  He is having more hallucinations that is more persistent.  Not giving daytime quetiapine.  More daytime anxiety, seem associated with the hallucinations (babies in room, animals that he sees).  He is not frightened. Not particularly active in the day.  He did enjoy PT but had some trouble with comprehension.  That was late summer time.  No falls.    Caregivers 24 hours per week, none on the weekends. some loose stools and some diarrhea.  Current prescribed movement disorder medications: Carbidopa/levodopa 25/100, 2 at 7am, 1/2 at 9:30am, 2 at 11am, 1/2 at 1:30, 2 at 4pm (they have started this dosing schedule) Carbidopa/levodopa 50/200 at bed Donepezil Quetiapine, 25 mg at bedtime and another 12.5 mg in the daytime if needed (taking 1 at bed) Depakote, 125 mg  ALLERGIES:  No Known Allergies  CURRENT MEDICATIONS:  Outpatient Encounter Medications as of 07/23/2022  Medication Sig   apixaban (ELIQUIS) 5 MG TABS tablet Take 1 tablet (5 mg total) by mouth 2 (two) times daily.   atenolol (TENORMIN) 25 MG tablet TAKE 1 TABLET BY MOUTH DAILY   carbidopa-levodopa (SINEMET CR) 50-200 MG tablet TAKE 1 TABLET BY MOUTH EVERY NIGHT AT BEDTIME   carbidopa-levodopa (  SINEMET CR) 50-200 MG tablet Take 1 tablet by mouth at bedtime.   carbidopa-levodopa (SINEMET IR) 25-100 MG tablet TAKE 2 TABLETS BY MOUTH THREE TIMES DAILY AND EXTRA 1/2 TABLET AS NEEDED   carboxymethylcellulose (REFRESH PLUS) 0.5 % SOLN INSTILL 1 DROP IN BOTH EYES FOUR TIMES A DAY   divalproex (DEPAKOTE) 125 MG DR tablet Take 1 tablet (125 mg total) by mouth at bedtime.   donepezil (ARICEPT) 10 MG tablet TAKE 1 TABLET BY MOUTH AT BEDTIME   FLUZONE HIGH-DOSE QUADRIVALENT 0.7 ML SUSY    iron polysaccharides (NIFEREX) 150 MG capsule TAKE 1 CAPSULE(150 MG) BY MOUTH DAILY (Patient taking differently: Take once a  week)   QUEtiapine (SEROQUEL) 25 MG tablet 1 tablet at bedtime   simvastatin (ZOCOR) 10 MG tablet TAKE 1 TABLET(10 MG) BY MOUTH AT BEDTIME   No facility-administered encounter medications on file as of 07/23/2022.    Objective:   PHYSICAL EXAMINATION:    VITALS:   Vitals:   07/23/22 1114  BP: 118/78  Pulse: 82  SpO2: 96%  Weight: 191 lb 6.4 oz (86.8 kg)  Height: '6\' 2"'$  (1.88 m)        GEN:  The patient appears stated age and is in NAD. HEENT:  Normocephalic, atraumatic.  The mucous membranes are moist. The superficial temporal arteries are without ropiness or tenderness. CV:  RRR Lungs:  CTAB Neck/HEME:  There are no carotid bruits bilaterally.  Neurological examination:  Orientation: The patient is alert during portions but somnolent during parts of the hx, when not activated.  He is easily aroused and able to thoroughly participate with all commands and generally answers fairly appropriately. Cranial nerves: There is good facial symmetry with facial hypomimia. The speech is fluent and clear. Minimal dysphasia.  Soft palate rises symmetrically and there is no tongue deviation. Hearing is intact to conversational tone. Sensation: Sensation is intact to light touch throughout Motor: Strength is at least antigravity x4.  Movement examination:  Tone: There is increased tone in the right upper extremity and right lower extremity, mild. Abnormal movements: there is mild, near constant RUE rest tremor (same as previous) Coordination:  There is mild decremation with RAM's Gait and Station: The patient pushes off of the chair to arise.  He has his walker and is just slightly unstable in the turns.  I have reviewed and interpreted the following labs independently    Chemistry      Component Value Date/Time   NA 140 01/10/2021 1159   NA 137 05/13/2020 0000   K 3.7 01/10/2021 1159   CL 100 01/10/2021 1159   CO2 35 (H) 01/10/2021 1159   BUN 19 01/10/2021 1159   BUN 22 (A)  05/13/2020 0000   CREATININE 1.06 01/10/2021 1159   CREATININE 1.02 04/05/2020 1212   GLU 93 05/13/2020 0000      Component Value Date/Time   CALCIUM 9.7 01/10/2021 1159   ALKPHOS 68 01/10/2021 1159   AST 19 01/10/2021 1159   ALT 9 01/10/2021 1159   BILITOT 2.1 (H) 01/10/2021 1159       Lab Results  Component Value Date   WBC 6.1 01/10/2021   HGB 16.6 01/10/2021   HCT 48.9 01/10/2021   MCV 91.5 01/10/2021   PLT 115.0 (L) 01/10/2021    Lab Results  Component Value Date   TSH 1.72 05/13/2020     Total time spent on today's visit was 32 minutes, including both face-to-face time and nonface-to-face time.  Time included that  spent on review of records (prior notes available to me/labs/imaging if pertinent), discussing treatment and goals, answering patient's questions and coordinating care.  Cc:  [name], MD

## 2022-07-23 ENCOUNTER — Ambulatory Visit: Payer: Medicare Other | Admitting: Neurology

## 2022-07-23 ENCOUNTER — Encounter: Payer: Self-pay | Admitting: Neurology

## 2022-07-23 VITALS — BP 118/78 | HR 82 | Ht 74.0 in | Wt 191.4 lb

## 2022-07-23 DIAGNOSIS — G20A1 Parkinson's disease without dyskinesia, without mention of fluctuations: Secondary | ICD-10-CM

## 2022-07-23 DIAGNOSIS — F02818 Dementia in other diseases classified elsewhere, unspecified severity, with other behavioral disturbance: Secondary | ICD-10-CM | POA: Diagnosis not present

## 2022-07-23 DIAGNOSIS — E538 Deficiency of other specified B group vitamins: Secondary | ICD-10-CM

## 2022-07-23 MED ORDER — QUETIAPINE FUMARATE 25 MG PO TABS: ORAL_TABLET | ORAL | 1 refills | Status: DC

## 2022-07-23 NOTE — Patient Instructions (Signed)
increase quetiapine, 25 mg, 1/2 in the AM, 1 at night.   2.  You have been diagnosed with low B12.  We recommend that you take over the counter B12, 1000 micrograms daily.

## 2022-07-24 ENCOUNTER — Other Ambulatory Visit: Payer: Self-pay | Admitting: Neurology

## 2022-07-24 DIAGNOSIS — F0280 Dementia in other diseases classified elsewhere without behavioral disturbance: Secondary | ICD-10-CM

## 2022-07-24 DIAGNOSIS — G20A1 Parkinson's disease without dyskinesia, without mention of fluctuations: Secondary | ICD-10-CM

## 2022-07-29 ENCOUNTER — Other Ambulatory Visit: Payer: Self-pay | Admitting: Neurology

## 2022-07-30 ENCOUNTER — Other Ambulatory Visit: Payer: Self-pay | Admitting: Family Medicine

## 2022-07-31 ENCOUNTER — Other Ambulatory Visit: Payer: Self-pay

## 2022-07-31 ENCOUNTER — Telehealth: Payer: Self-pay | Admitting: Neurology

## 2022-07-31 MED ORDER — QUETIAPINE FUMARATE 25 MG PO TABS: ORAL_TABLET | ORAL | 1 refills | Status: DC

## 2022-07-31 NOTE — Telephone Encounter (Signed)
Pt's daughter called in and left a message. She stated the pharmacy has sent [place] a request for a refill for the pt's quetiapine and they still do not have the prescription. The pt has taken his last pill. They would like to find out what is going on.

## 2022-07-31 NOTE — Telephone Encounter (Signed)
Called [facility] AND LEFT VOICEMAIL MESSAGE

## 2022-07-31 NOTE — Telephone Encounter (Signed)
Sent prescription will call Daughter

## 2022-08-16 ENCOUNTER — Encounter: Payer: Self-pay | Admitting: *Deleted

## 2022-10-12 ENCOUNTER — Telehealth: Payer: Self-pay | Admitting: Anesthesiology

## 2022-10-12 NOTE — Telephone Encounter (Signed)
Called patients daughter she is on DPR and able to provide them with a medication list to help until they can get the [place] to fill out the [facility] form that they are needing

## 2022-10-12 NOTE — Telephone Encounter (Signed)
Pt's daughter called stating she has questions regarding pt's medications. He is going to an assisted living and she needs to get information to provide them about his father's medication.

## 2022-10-15 ENCOUNTER — Telehealth: Payer: Self-pay | Admitting: Family Medicine

## 2022-10-15 DIAGNOSIS — L570 Actinic keratosis: Secondary | ICD-10-CM | POA: Diagnosis not present

## 2022-10-15 DIAGNOSIS — D0421 Carcinoma in situ of skin of right ear and external auricular canal: Secondary | ICD-10-CM | POA: Diagnosis not present

## 2022-10-15 NOTE — Telephone Encounter (Signed)
Have not seen fax prior to this message, awaiting re fax.

## 2022-10-15 NOTE — Telephone Encounter (Signed)
Caller states: -They faxed a request for list of patient's medications on 02/07; they need this to complete his FL2 forms  - They would need this information as soon as possible   I recommended caller re-fax request, caller verbalized understanding. Can we confirm if received?   For additional info, caller can be reached at [phone] [phone] or her colleague, Nira Conn @ [phone] ext. [phone]

## 2022-10-27 DIAGNOSIS — Z8616 Personal history of COVID-19: Secondary | ICD-10-CM | POA: Diagnosis not present

## 2022-10-27 DIAGNOSIS — Z7401 Bed confinement status: Secondary | ICD-10-CM | POA: Diagnosis not present

## 2022-10-27 DIAGNOSIS — R1310 Dysphagia, unspecified: Secondary | ICD-10-CM | POA: Diagnosis not present

## 2022-10-27 DIAGNOSIS — T380X5A Adverse effect of glucocorticoids and synthetic analogues, initial encounter: Secondary | ICD-10-CM | POA: Diagnosis not present

## 2022-10-27 DIAGNOSIS — Z7189 Other specified counseling: Secondary | ICD-10-CM | POA: Diagnosis not present

## 2022-10-27 DIAGNOSIS — J9601 Acute respiratory failure with hypoxia: Secondary | ICD-10-CM | POA: Diagnosis not present

## 2022-10-27 DIAGNOSIS — A419 Sepsis, unspecified organism: Secondary | ICD-10-CM | POA: Diagnosis not present

## 2022-10-27 DIAGNOSIS — E876 Hypokalemia: Secondary | ICD-10-CM | POA: Diagnosis not present

## 2022-10-27 DIAGNOSIS — R410 Disorientation, unspecified: Secondary | ICD-10-CM | POA: Diagnosis not present

## 2022-10-27 DIAGNOSIS — G309 Alzheimer's disease, unspecified: Secondary | ICD-10-CM | POA: Diagnosis not present

## 2022-10-27 DIAGNOSIS — I4891 Unspecified atrial fibrillation: Secondary | ICD-10-CM | POA: Diagnosis not present

## 2022-10-27 DIAGNOSIS — R1312 Dysphagia, oropharyngeal phase: Secondary | ICD-10-CM | POA: Diagnosis not present

## 2022-10-27 DIAGNOSIS — I959 Hypotension, unspecified: Secondary | ICD-10-CM | POA: Diagnosis not present

## 2022-10-27 DIAGNOSIS — I517 Cardiomegaly: Secondary | ICD-10-CM | POA: Diagnosis not present

## 2022-10-27 DIAGNOSIS — U071 COVID-19: Secondary | ICD-10-CM | POA: Diagnosis not present

## 2022-10-27 DIAGNOSIS — R2689 Other abnormalities of gait and mobility: Secondary | ICD-10-CM | POA: Diagnosis not present

## 2022-10-27 DIAGNOSIS — Z7901 Long term (current) use of anticoagulants: Secondary | ICD-10-CM | POA: Diagnosis not present

## 2022-10-27 DIAGNOSIS — Z66 Do not resuscitate: Secondary | ICD-10-CM | POA: Diagnosis not present

## 2022-10-27 DIAGNOSIS — G9389 Other specified disorders of brain: Secondary | ICD-10-CM | POA: Diagnosis not present

## 2022-10-27 DIAGNOSIS — Z515 Encounter for palliative care: Secondary | ICD-10-CM | POA: Diagnosis not present

## 2022-10-27 DIAGNOSIS — R6520 Severe sepsis without septic shock: Secondary | ICD-10-CM | POA: Diagnosis not present

## 2022-10-27 DIAGNOSIS — R339 Retention of urine, unspecified: Secondary | ICD-10-CM | POA: Diagnosis not present

## 2022-10-27 DIAGNOSIS — J984 Other disorders of lung: Secondary | ICD-10-CM | POA: Diagnosis not present

## 2022-10-27 DIAGNOSIS — R059 Cough, unspecified: Secondary | ICD-10-CM | POA: Diagnosis not present

## 2022-10-27 DIAGNOSIS — R000 Tachycardia, unspecified: Secondary | ICD-10-CM | POA: Diagnosis not present

## 2022-10-27 DIAGNOSIS — E611 Iron deficiency: Secondary | ICD-10-CM | POA: Diagnosis not present

## 2022-10-27 DIAGNOSIS — G9341 Metabolic encephalopathy: Secondary | ICD-10-CM | POA: Diagnosis not present

## 2022-10-27 DIAGNOSIS — J1282 Pneumonia due to coronavirus disease 2019: Secondary | ICD-10-CM | POA: Diagnosis not present

## 2022-10-27 DIAGNOSIS — R451 Restlessness and agitation: Secondary | ICD-10-CM | POA: Diagnosis not present

## 2022-10-27 DIAGNOSIS — R54 Age-related physical debility: Secondary | ICD-10-CM | POA: Diagnosis not present

## 2022-10-27 DIAGNOSIS — I083 Combined rheumatic disorders of mitral, aortic and tricuspid valves: Secondary | ICD-10-CM | POA: Diagnosis not present

## 2022-10-27 DIAGNOSIS — R0689 Other abnormalities of breathing: Secondary | ICD-10-CM | POA: Diagnosis not present

## 2022-10-27 DIAGNOSIS — A4189 Other specified sepsis: Secondary | ICD-10-CM | POA: Diagnosis not present

## 2022-10-27 DIAGNOSIS — R509 Fever, unspecified: Secondary | ICD-10-CM | POA: Diagnosis not present

## 2022-10-27 DIAGNOSIS — I480 Paroxysmal atrial fibrillation: Secondary | ICD-10-CM | POA: Diagnosis not present

## 2022-10-27 DIAGNOSIS — G20A1 Parkinson's disease without dyskinesia, without mention of fluctuations: Secondary | ICD-10-CM | POA: Diagnosis not present

## 2022-10-27 DIAGNOSIS — J90 Pleural effusion, not elsewhere classified: Secondary | ICD-10-CM | POA: Diagnosis not present

## 2022-10-27 DIAGNOSIS — I10 Essential (primary) hypertension: Secondary | ICD-10-CM | POA: Diagnosis not present

## 2022-10-27 DIAGNOSIS — D688 Other specified coagulation defects: Secondary | ICD-10-CM | POA: Diagnosis not present

## 2022-10-27 DIAGNOSIS — E785 Hyperlipidemia, unspecified: Secondary | ICD-10-CM | POA: Diagnosis not present

## 2022-10-27 DIAGNOSIS — F0284 Dementia in other diseases classified elsewhere, unspecified severity, with anxiety: Secondary | ICD-10-CM | POA: Diagnosis not present

## 2022-10-27 DIAGNOSIS — R633 Feeding difficulties, unspecified: Secondary | ICD-10-CM | POA: Diagnosis not present

## 2022-10-27 DIAGNOSIS — Z741 Need for assistance with personal care: Secondary | ICD-10-CM | POA: Diagnosis not present

## 2022-10-27 DIAGNOSIS — E872 Acidosis, unspecified: Secondary | ICD-10-CM | POA: Diagnosis not present

## 2022-10-27 DIAGNOSIS — Z466 Encounter for fitting and adjustment of urinary device: Secondary | ICD-10-CM | POA: Diagnosis not present

## 2022-10-27 DIAGNOSIS — R400 Somnolence: Secondary | ICD-10-CM | POA: Diagnosis not present

## 2022-10-27 DIAGNOSIS — Z79899 Other long term (current) drug therapy: Secondary | ICD-10-CM | POA: Diagnosis not present

## 2022-10-27 DIAGNOSIS — M6281 Muscle weakness (generalized): Secondary | ICD-10-CM | POA: Diagnosis not present

## 2022-10-27 DIAGNOSIS — K5909 Other constipation: Secondary | ICD-10-CM | POA: Diagnosis not present

## 2022-10-27 DIAGNOSIS — R0902 Hypoxemia: Secondary | ICD-10-CM | POA: Diagnosis not present

## 2022-11-07 DIAGNOSIS — G20A1 Parkinson's disease without dyskinesia, without mention of fluctuations: Secondary | ICD-10-CM | POA: Diagnosis not present

## 2022-11-07 DIAGNOSIS — Z8616 Personal history of COVID-19: Secondary | ICD-10-CM | POA: Diagnosis not present

## 2022-11-07 DIAGNOSIS — A419 Sepsis, unspecified organism: Secondary | ICD-10-CM | POA: Diagnosis not present

## 2022-11-07 DIAGNOSIS — G9341 Metabolic encephalopathy: Secondary | ICD-10-CM | POA: Diagnosis not present

## 2022-11-07 DIAGNOSIS — F0284 Dementia in other diseases classified elsewhere, unspecified severity, with anxiety: Secondary | ICD-10-CM | POA: Diagnosis not present

## 2022-11-07 DIAGNOSIS — R1312 Dysphagia, oropharyngeal phase: Secondary | ICD-10-CM | POA: Diagnosis not present

## 2022-11-07 DIAGNOSIS — Z9181 History of falling: Secondary | ICD-10-CM | POA: Diagnosis not present

## 2022-11-07 DIAGNOSIS — E870 Hyperosmolality and hypernatremia: Secondary | ICD-10-CM | POA: Diagnosis not present

## 2022-11-07 DIAGNOSIS — R633 Feeding difficulties, unspecified: Secondary | ICD-10-CM | POA: Diagnosis not present

## 2022-11-07 DIAGNOSIS — R0989 Other specified symptoms and signs involving the circulatory and respiratory systems: Secondary | ICD-10-CM | POA: Diagnosis not present

## 2022-11-07 DIAGNOSIS — R2689 Other abnormalities of gait and mobility: Secondary | ICD-10-CM | POA: Diagnosis not present

## 2022-11-07 DIAGNOSIS — M6281 Muscle weakness (generalized): Secondary | ICD-10-CM | POA: Diagnosis not present

## 2022-11-07 DIAGNOSIS — F02818 Dementia in other diseases classified elsewhere, unspecified severity, with other behavioral disturbance: Secondary | ICD-10-CM | POA: Diagnosis not present

## 2022-11-07 DIAGNOSIS — I517 Cardiomegaly: Secondary | ICD-10-CM | POA: Diagnosis not present

## 2022-11-07 DIAGNOSIS — I10 Essential (primary) hypertension: Secondary | ICD-10-CM | POA: Diagnosis not present

## 2022-11-07 DIAGNOSIS — R339 Retention of urine, unspecified: Secondary | ICD-10-CM | POA: Diagnosis not present

## 2022-11-07 DIAGNOSIS — Z466 Encounter for fitting and adjustment of urinary device: Secondary | ICD-10-CM | POA: Diagnosis not present

## 2022-11-07 DIAGNOSIS — R1310 Dysphagia, unspecified: Secondary | ICD-10-CM | POA: Diagnosis not present

## 2022-11-07 DIAGNOSIS — R5383 Other fatigue: Secondary | ICD-10-CM | POA: Diagnosis not present

## 2022-11-07 DIAGNOSIS — R531 Weakness: Secondary | ICD-10-CM | POA: Diagnosis not present

## 2022-11-07 DIAGNOSIS — I480 Paroxysmal atrial fibrillation: Secondary | ICD-10-CM | POA: Diagnosis not present

## 2022-11-07 DIAGNOSIS — R410 Disorientation, unspecified: Secondary | ICD-10-CM | POA: Diagnosis not present

## 2022-11-07 DIAGNOSIS — E611 Iron deficiency: Secondary | ICD-10-CM | POA: Diagnosis not present

## 2022-11-07 DIAGNOSIS — E785 Hyperlipidemia, unspecified: Secondary | ICD-10-CM | POA: Diagnosis not present

## 2022-11-07 DIAGNOSIS — Z741 Need for assistance with personal care: Secondary | ICD-10-CM | POA: Diagnosis not present

## 2022-11-07 DIAGNOSIS — Z7401 Bed confinement status: Secondary | ICD-10-CM | POA: Diagnosis not present

## 2022-11-07 DIAGNOSIS — R6520 Severe sepsis without septic shock: Secondary | ICD-10-CM | POA: Diagnosis not present

## 2022-11-07 DIAGNOSIS — G309 Alzheimer's disease, unspecified: Secondary | ICD-10-CM | POA: Diagnosis not present

## 2022-11-07 DIAGNOSIS — R400 Somnolence: Secondary | ICD-10-CM | POA: Diagnosis not present

## 2022-11-12 DIAGNOSIS — I480 Paroxysmal atrial fibrillation: Secondary | ICD-10-CM | POA: Diagnosis not present

## 2022-11-12 DIAGNOSIS — A419 Sepsis, unspecified organism: Secondary | ICD-10-CM | POA: Diagnosis not present

## 2022-11-12 DIAGNOSIS — R410 Disorientation, unspecified: Secondary | ICD-10-CM | POA: Diagnosis not present

## 2022-11-12 DIAGNOSIS — R6520 Severe sepsis without septic shock: Secondary | ICD-10-CM | POA: Diagnosis not present

## 2022-11-12 DIAGNOSIS — G20A1 Parkinson's disease without dyskinesia, without mention of fluctuations: Secondary | ICD-10-CM | POA: Diagnosis not present

## 2022-11-12 DIAGNOSIS — E785 Hyperlipidemia, unspecified: Secondary | ICD-10-CM | POA: Diagnosis not present

## 2022-11-12 DIAGNOSIS — I10 Essential (primary) hypertension: Secondary | ICD-10-CM | POA: Diagnosis not present

## 2022-11-13 DIAGNOSIS — G20A1 Parkinson's disease without dyskinesia, without mention of fluctuations: Secondary | ICD-10-CM | POA: Diagnosis not present

## 2022-11-13 DIAGNOSIS — R339 Retention of urine, unspecified: Secondary | ICD-10-CM | POA: Diagnosis not present

## 2022-11-19 DIAGNOSIS — F02818 Dementia in other diseases classified elsewhere, unspecified severity, with other behavioral disturbance: Secondary | ICD-10-CM | POA: Diagnosis not present

## 2022-11-19 DIAGNOSIS — G309 Alzheimer's disease, unspecified: Secondary | ICD-10-CM | POA: Diagnosis not present

## 2022-11-19 DIAGNOSIS — G20A1 Parkinson's disease without dyskinesia, without mention of fluctuations: Secondary | ICD-10-CM | POA: Diagnosis not present

## 2022-11-19 DIAGNOSIS — Z9181 History of falling: Secondary | ICD-10-CM | POA: Diagnosis not present

## 2022-11-19 DIAGNOSIS — G9341 Metabolic encephalopathy: Secondary | ICD-10-CM | POA: Diagnosis not present

## 2022-11-26 DIAGNOSIS — I480 Paroxysmal atrial fibrillation: Secondary | ICD-10-CM | POA: Diagnosis not present

## 2022-11-26 DIAGNOSIS — G309 Alzheimer's disease, unspecified: Secondary | ICD-10-CM | POA: Diagnosis not present

## 2022-11-26 DIAGNOSIS — G9341 Metabolic encephalopathy: Secondary | ICD-10-CM | POA: Diagnosis not present

## 2022-11-26 DIAGNOSIS — R531 Weakness: Secondary | ICD-10-CM | POA: Diagnosis not present

## 2022-11-27 DIAGNOSIS — I480 Paroxysmal atrial fibrillation: Secondary | ICD-10-CM | POA: Diagnosis not present

## 2022-11-27 DIAGNOSIS — G9341 Metabolic encephalopathy: Secondary | ICD-10-CM | POA: Diagnosis not present

## 2022-11-27 DIAGNOSIS — G309 Alzheimer's disease, unspecified: Secondary | ICD-10-CM | POA: Diagnosis not present

## 2022-11-27 DIAGNOSIS — E870 Hyperosmolality and hypernatremia: Secondary | ICD-10-CM | POA: Diagnosis not present

## 2022-11-27 DIAGNOSIS — R5383 Other fatigue: Secondary | ICD-10-CM | POA: Diagnosis not present

## 2022-12-03 DEATH — deceased

## 2022-12-11 ENCOUNTER — Encounter: Payer: Self-pay | Admitting: Neurology

## 2022-12-18 ENCOUNTER — Telehealth: Payer: Self-pay

## 2022-12-18 NOTE — Telephone Encounter (Signed)
[name] called in to thank Dr.Tat for all that she has done for Carlos [name] and wanted to inform her that he passed away on 2022-12-13.

## 2023-01-07 ENCOUNTER — Ambulatory Visit: Payer: Medicare Other | Admitting: Neurology

## 2023-01-22 ENCOUNTER — Ambulatory Visit: Payer: Medicare Other | Admitting: Neurology
